# Patient Record
Sex: Male | Born: 1935
Health system: Southern US, Community
[De-identification: ages and names within clinical notes are randomized; demographics above are authoritative.]

## PROBLEM LIST (undated history)

## (undated) DIAGNOSIS — Z789 Other specified health status: Secondary | ICD-10-CM

## (undated) DIAGNOSIS — J189 Pneumonia, unspecified organism: Secondary | ICD-10-CM

## (undated) DIAGNOSIS — Z741 Need for assistance with personal care: Secondary | ICD-10-CM

## (undated) DIAGNOSIS — F0390 Unspecified dementia without behavioral disturbance: Secondary | ICD-10-CM

## (undated) DIAGNOSIS — I82409 Acute embolism and thrombosis of unspecified deep veins of unspecified lower extremity: Secondary | ICD-10-CM

## (undated) DIAGNOSIS — M1610 Unilateral primary osteoarthritis, unspecified hip: Secondary | ICD-10-CM

## (undated) DIAGNOSIS — F03A0 Unspecified dementia, mild, without behavioral disturbance, psychotic disturbance, mood disturbance, and anxiety: Secondary | ICD-10-CM

## (undated) HISTORY — PX: TONSILLECTOMY: SUR1361

## (undated) HISTORY — PX: HIP SURGERY: SHX245

## (undated) HISTORY — PX: APPENDECTOMY: SHX54

## (undated) HISTORY — DX: Unilateral primary osteoarthritis, unspecified hip: M16.10

## (undated) HISTORY — DX: Pneumonia, unspecified organism: J18.9

---

## 2001-11-14 ENCOUNTER — Other Ambulatory Visit: Admission: RE | Admit: 2001-11-14 | Discharge: 2001-11-14 | Payer: Self-pay | Admitting: Dermatology

## 2002-09-28 ENCOUNTER — Ambulatory Visit (HOSPITAL_COMMUNITY): Admission: RE | Admit: 2002-09-28 | Discharge: 2002-09-28 | Payer: Self-pay | Admitting: Family Medicine

## 2004-04-10 ENCOUNTER — Ambulatory Visit (HOSPITAL_COMMUNITY): Admission: RE | Admit: 2004-04-10 | Discharge: 2004-04-10 | Payer: Self-pay | Admitting: Family Medicine

## 2004-04-14 ENCOUNTER — Ambulatory Visit (HOSPITAL_COMMUNITY): Admission: RE | Admit: 2004-04-14 | Discharge: 2004-04-14 | Payer: Self-pay | Admitting: Family Medicine

## 2005-05-26 ENCOUNTER — Ambulatory Visit (HOSPITAL_COMMUNITY): Admission: RE | Admit: 2005-05-26 | Discharge: 2005-05-26 | Payer: Self-pay | Admitting: Family Medicine

## 2006-01-05 ENCOUNTER — Encounter (INDEPENDENT_AMBULATORY_CARE_PROVIDER_SITE_OTHER): Payer: Self-pay | Admitting: Internal Medicine

## 2006-01-05 ENCOUNTER — Ambulatory Visit (HOSPITAL_COMMUNITY): Admission: RE | Admit: 2006-01-05 | Discharge: 2006-01-05 | Payer: Self-pay | Admitting: Internal Medicine

## 2006-01-05 ENCOUNTER — Ambulatory Visit: Payer: Self-pay | Admitting: Internal Medicine

## 2008-11-02 ENCOUNTER — Encounter: Payer: Self-pay | Admitting: Cardiology

## 2009-01-31 ENCOUNTER — Encounter: Payer: Self-pay | Admitting: Cardiology

## 2009-05-10 ENCOUNTER — Ambulatory Visit (HOSPITAL_COMMUNITY): Admission: RE | Admit: 2009-05-10 | Discharge: 2009-05-10 | Payer: Self-pay | Admitting: Family Medicine

## 2009-05-14 ENCOUNTER — Inpatient Hospital Stay (HOSPITAL_COMMUNITY): Admission: EM | Admit: 2009-05-14 | Discharge: 2009-05-20 | Payer: Self-pay | Admitting: Emergency Medicine

## 2009-05-24 ENCOUNTER — Ambulatory Visit (HOSPITAL_COMMUNITY): Admission: RE | Admit: 2009-05-24 | Discharge: 2009-05-24 | Payer: Self-pay | Admitting: Family Medicine

## 2009-06-20 ENCOUNTER — Encounter: Payer: Self-pay | Admitting: Cardiology

## 2009-07-04 ENCOUNTER — Encounter: Payer: Self-pay | Admitting: Cardiology

## 2009-07-05 ENCOUNTER — Ambulatory Visit (HOSPITAL_COMMUNITY): Admission: RE | Admit: 2009-07-05 | Discharge: 2009-07-05 | Payer: Self-pay | Admitting: Family Medicine

## 2009-07-05 ENCOUNTER — Encounter: Payer: Self-pay | Admitting: Family Medicine

## 2009-07-05 ENCOUNTER — Ambulatory Visit: Payer: Self-pay | Admitting: Cardiology

## 2009-07-05 ENCOUNTER — Encounter: Payer: Self-pay | Admitting: Cardiology

## 2009-07-15 DIAGNOSIS — M1610 Unilateral primary osteoarthritis, unspecified hip: Secondary | ICD-10-CM | POA: Insufficient documentation

## 2009-07-23 ENCOUNTER — Ambulatory Visit: Payer: Self-pay

## 2009-07-23 ENCOUNTER — Encounter: Payer: Self-pay | Admitting: Cardiology

## 2009-07-23 DIAGNOSIS — I459 Conduction disorder, unspecified: Secondary | ICD-10-CM | POA: Insufficient documentation

## 2009-07-23 DIAGNOSIS — I872 Venous insufficiency (chronic) (peripheral): Secondary | ICD-10-CM | POA: Insufficient documentation

## 2009-08-14 ENCOUNTER — Inpatient Hospital Stay (HOSPITAL_COMMUNITY): Admission: RE | Admit: 2009-08-14 | Discharge: 2009-08-19 | Payer: Self-pay | Admitting: Orthopedic Surgery

## 2009-08-19 ENCOUNTER — Inpatient Hospital Stay: Admission: AD | Admit: 2009-08-19 | Discharge: 2009-08-26 | Payer: Self-pay | Admitting: Family Medicine

## 2010-05-09 ENCOUNTER — Ambulatory Visit (HOSPITAL_COMMUNITY): Admission: RE | Admit: 2010-05-09 | Discharge: 2010-05-09 | Payer: Self-pay | Admitting: Family Medicine

## 2011-01-16 ENCOUNTER — Encounter (INDEPENDENT_AMBULATORY_CARE_PROVIDER_SITE_OTHER): Payer: Self-pay

## 2011-01-21 NOTE — Letter (Signed)
Summary: Recall Colonoscopy/Endoscopy, Change to Office Visit  [facility]  [address]   [place], [place] [address]   Phone: [phone]  Fax: [phone]      January 16, 2011   [name] [address] [place], [place]  [address] January 31, 1927   Dear [name],   According to our records, it is time for you to schedule a Colonoscopy/Endoscopy. However, after reviewing your medical record, we recommend an office visit in order to determine your need for a repeat procedure.  Please call [phone] at your convenience to schedule an office visit. If you have any questions or concerns, please feel free to contact our office.   Sincerely,   Cloria Spring LPN  [facility] Ph: [phone]   Fax: [phone]

## 2011-03-06 LAB — BASIC METABOLIC PANEL
BUN: 16 mg/dL (ref 6–23)
CO2: 30 mEq/L (ref 19–32)
Chloride: 101 mEq/L (ref 96–112)
Creatinine, Ser: 0.83 mg/dL (ref 0.4–1.5)
Creatinine, Ser: 0.91 mg/dL (ref 0.4–1.5)
GFR calc Af Amer: >60 mL/min (ref >60)
GFR calc non Af Amer: >60 mL/min (ref >60)
GFR calc non Af Amer: >60 mL/min (ref >60)
Glucose, Bld: 124 mg/dL — ABNORMAL HIGH (ref 70–99)
Potassium: 4.3 mEq/L (ref 3.5–5.1)

## 2011-03-06 LAB — COMPREHENSIVE METABOLIC PANEL
AST: 27 U/L (ref 0–37)
CO2: 30 mEq/L (ref 19–32)
Calcium: 9.2 mg/dL (ref 8.4–10.5)
Creatinine, Ser: 0.89 mg/dL (ref 0.4–1.5)
GFR calc Af Amer: >60 mL/min (ref >60)
GFR calc non Af Amer: >60 mL/min (ref >60)
Glucose, Bld: 84 mg/dL (ref 70–99)
Sodium: 140 mEq/L (ref 135–145)
Total Protein: 6.6 g/dL (ref 6.0–8.3)

## 2011-03-06 LAB — CBC
HCT: 28.6 % — ABNORMAL LOW (ref 39.0–52.0)
HCT: 30.5 % — ABNORMAL LOW (ref 39.0–52.0)
Hemoglobin: 10.2 g/dL — ABNORMAL LOW (ref 13.0–17.0)
Hemoglobin: 10.3 g/dL — ABNORMAL LOW (ref 13.0–17.0)
MCHC: 33.4 g/dL (ref 30.0–36.0)
MCHC: 34.2 g/dL (ref 30.0–36.0)
MCHC: 33.3 g/dL (ref 30.0–36.0)
MCV: 86 fL (ref 78.0–100.0)
Platelets: 113 10*3/uL — ABNORMAL LOW (ref 150–400)
RBC: 3.35 MIL/uL — ABNORMAL LOW (ref 4.22–5.81)
RBC: 3.52 MIL/uL — ABNORMAL LOW (ref 4.22–5.81)
RBC: 3.56 MIL/uL — ABNORMAL LOW (ref 4.22–5.81)
RBC: 4.4 MIL/uL (ref 4.22–5.81)
RDW: 15 % (ref 11.5–15.5)
WBC: 6.7 10*3/uL (ref 4.0–10.5)
WBC: 8.3 10*3/uL (ref 4.0–10.5)
WBC: 8.5 10*3/uL (ref 4.0–10.5)

## 2011-03-06 LAB — PROTIME-INR
INR: 1.1 (ref 0.00–1.49)
INR: 1.3 (ref 0.00–1.49)
INR: 1.6 — ABNORMAL HIGH (ref 0.00–1.49)
INR: 1.9 — ABNORMAL HIGH (ref 0.00–1.49)
INR: 2.4 — ABNORMAL HIGH (ref 0.00–1.49)
Prothrombin Time: 14 seconds (ref 11.6–15.2)
Prothrombin Time: 16.4 seconds — ABNORMAL HIGH (ref 11.6–15.2)
Prothrombin Time: 21.4 seconds — ABNORMAL HIGH (ref 11.6–15.2)
Prothrombin Time: 25.6 seconds — ABNORMAL HIGH (ref 11.6–15.2)
Prothrombin Time: 12.9 seconds (ref 11.6–15.2)

## 2011-03-06 LAB — APTT: aPTT: 27 seconds (ref 24–37)

## 2011-03-06 LAB — TYPE AND SCREEN
ABO/RH(D): A NEG
Antibody Screen: NEGATIVE

## 2011-03-06 LAB — URINALYSIS, ROUTINE W REFLEX MICROSCOPIC
Bilirubin Urine: NEGATIVE
Ketones, ur: NEGATIVE mg/dL
Leukocytes, UA: NEGATIVE
Nitrite: NEGATIVE
Protein, ur: NEGATIVE mg/dL
pH: 5.5 (ref 5.0–8.0)

## 2011-03-07 LAB — DIFFERENTIAL
Basophils Absolute: 0 10*3/uL (ref 0.0–0.1)
Basophils Relative: 0 % (ref 0–1)
Eosinophils Absolute: 0.1 10*3/uL (ref 0.0–0.7)
Neutro Abs: 3.8 10*3/uL (ref 1.7–7.7)
Neutrophils Relative %: 68 % (ref 43–77)

## 2011-03-07 LAB — BASIC METABOLIC PANEL
BUN: 24 mg/dL — ABNORMAL HIGH (ref 6–23)
CO2: 32 mEq/L (ref 19–32)
Calcium: 9.7 mg/dL (ref 8.4–10.5)
Chloride: 102 mEq/L (ref 96–112)
Creatinine, Ser: 1.04 mg/dL (ref 0.4–1.5)

## 2011-03-07 LAB — HEPATIC FUNCTION PANEL
Albumin: 4 g/dL (ref 3.5–5.2)
Total Bilirubin: 0.5 mg/dL (ref 0.3–1.2)
Total Protein: 7.2 g/dL (ref 6.0–8.3)

## 2011-03-07 LAB — CBC
MCHC: 34.1 g/dL (ref 30.0–36.0)
MCV: 84.8 fL (ref 78.0–100.0)
Platelets: 160 10*3/uL (ref 150–400)
RDW: 15.9 % — ABNORMAL HIGH (ref 11.5–15.5)

## 2011-03-07 LAB — BRAIN NATRIURETIC PEPTIDE: Pro B Natriuretic peptide (BNP): 70.3 pg/mL (ref 0.0–100.0)

## 2011-03-09 LAB — CBC
HCT: 37.7 % — ABNORMAL LOW (ref 39.0–52.0)
Hemoglobin: 12.6 g/dL — ABNORMAL LOW (ref 13.0–17.0)
Hemoglobin: 12.8 g/dL — ABNORMAL LOW (ref 13.0–17.0)
MCHC: 34.4 g/dL (ref 30.0–36.0)
MCV: 83.6 fL (ref 78.0–100.0)
Platelets: 109 10*3/uL — ABNORMAL LOW (ref 150–400)
Platelets: 111 10*3/uL — ABNORMAL LOW (ref 150–400)
RBC: 4.37 MIL/uL (ref 4.22–5.81)
RBC: 4.47 MIL/uL (ref 4.22–5.81)
RDW: 14.8 % (ref 11.5–15.5)
WBC: 12.3 10*3/uL — ABNORMAL HIGH (ref 4.0–10.5)

## 2011-03-09 LAB — COMPREHENSIVE METABOLIC PANEL
AST: 23 U/L (ref 0–37)
Albumin: 3.6 g/dL (ref 3.5–5.2)
Alkaline Phosphatase: 87 U/L (ref 39–117)
CO2: 31 mEq/L (ref 19–32)
Chloride: 98 mEq/L (ref 96–112)
GFR calc Af Amer: >60 mL/min (ref >60)
GFR calc non Af Amer: >60 mL/min (ref >60)
Potassium: 3.8 mEq/L (ref 3.5–5.1)
Total Bilirubin: 1.1 mg/dL (ref 0.3–1.2)

## 2011-03-09 LAB — DIFFERENTIAL
Basophils Absolute: 0 10*3/uL (ref 0.0–0.1)
Basophils Absolute: 0 10*3/uL (ref 0.0–0.1)
Basophils Absolute: 0.1 10*3/uL (ref 0.0–0.1)
Basophils Relative: 1 % (ref 0–1)
Eosinophils Absolute: 0.2 10*3/uL (ref 0.0–0.7)
Eosinophils Absolute: 0 10*3/uL (ref 0.0–0.7)
Eosinophils Relative: 3 % (ref 0–5)
Eosinophils Relative: 0 % (ref 0–5)
Lymphocytes Relative: 5 % — ABNORMAL LOW (ref 12–46)
Lymphs Abs: 0.6 10*3/uL — ABNORMAL LOW (ref 0.7–4.0)
Monocytes Absolute: 0.9 10*3/uL (ref 0.1–1.0)
Neutro Abs: 6.5 10*3/uL (ref 1.7–7.7)
Neutro Abs: 6.8 10*3/uL (ref 1.7–7.7)

## 2011-03-09 LAB — CULTURE, BLOOD (ROUTINE X 2): Culture: NO GROWTH

## 2011-03-09 LAB — URINE CULTURE: Culture: NO GROWTH

## 2011-03-09 LAB — BASIC METABOLIC PANEL
Chloride: 101 mEq/L (ref 96–112)
Creatinine, Ser: 1.12 mg/dL (ref 0.4–1.5)
GFR calc Af Amer: >60 mL/min (ref >60)
Sodium: 138 mEq/L (ref 135–145)

## 2011-04-14 NOTE — Group Therapy Note (Signed)
NAME:  CEBERT, DETTMANN                ACCOUNT NO.:  [id number]   MEDICAL RECORD NO.:  [id number]          PATIENT TYPE:  INP   LOCATION:  A317                          FACILITY:  [facility]   PHYSICIAN:  Edward L. Juanetta Gosling, M.D.DATE OF BIRTH:  1926-12-26   DATE OF PROCEDURE:  DATE OF DISCHARGE:                                 PROGRESS NOTE   Mr. Rupe looks much better this morning.  He is awake and alert, seems  more comfortable and has no new complaints.  He says he is breathing  okay.   PHYSICAL EXAMINATION:  VITAL SIGNS:  Temp is 98, pulse 60, respirations  20, blood pressure 152/85, and O2 sat is 97% on room air.  CHEST:  Clear.  HEART:  Regular without gallop.  ABDOMEN:  Soft.   ASSESSMENT:  I think, he is better.   PLAN:  For him to have a chest x-ray, CBC in the morning.  I think, he  probably needs a PT consultation to try to get him up.      Edward L. Juanetta Gosling, M.D.  Electronically Signed     ELH/MEDQ  D:  05/19/2009  T:  05/20/2009  Job:  [id number]

## 2011-04-14 NOTE — H&P (Signed)
NAME:  [name], [name]                ACCOUNT NO.:  [id number]   MEDICAL RECORD NO.:  [id number]          PATIENT TYPE:  INP   LOCATION:  A317                          FACILITY:  [facility]   PHYSICIAN:  Edward L. Juanetta Gosling, M.D.DATE OF BIRTH:  Jul 05, 1927   DATE OF ADMISSION:  05/14/2009  DATE OF DISCHARGE:  LH                              HISTORY & PHYSICAL   PRIMARY CARE PHYSICIAN:  Angus G. McInnis, MD   REASON FOR ADMISSION:  Weakness, change in mental status, abnormal chest  x-ray.   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  According to family, he has been a bit  confused, complaining of left hip pain and he has had some diarrhea,  poor appetite and eventually was brought to the emergency room because  of all this.   PAST MEDICAL HISTORY:  Positive for arthritis.   MEDICATIONS:  None chronically.   SOCIAL HISTORY:  He does not smoke, does not use any alcohol. Does not  use any illicit drugs.   FAMILY HISTORY:  Not positive for any serious medical problems as far as  is know.   REVIEW OF SYSTEMS:  Except as mentioned, he has not lost any weight.  He  has complained of arthritis in his hip.  He had not had a cough.   PHYSICAL EXAMINATION:  Blood pressure 130/52, pulse 76, respirations 18  and temperature 100.5.  GENERAL:  He is a bit confused and has a little more word searching  inability to express himself than I have seen in the past.  HEENT:  His pupils are reactive, nose and throat are clear.  NECK:  Supple without masses.  HEART:  Regular without gallop.  ABDOMEN:  Soft.  CHEST:  Clear.   LABORATORY DATA:  White count 12,300, hemoglobin 13.  Platelets 111.  Cardiac panel is negative.  He had a chest x-ray that shows what may be  a mass in the right lower lobe area.   ASSESSMENT:  He has had some confusion, some change in mental status.  I  have  asked him to go ahead and have a CT of the brain, and I have  ordered CT of the chest to determine what the mass in his chest is.  Will start him on antibiotics.  Further evaluation per Dr. Renard Matter in  the morning.      Edward L. Juanetta Gosling, M.D.  Electronically Signed     ELH/MEDQ  D:  05/15/2009  T:  05/15/2009  Job:  [id number]

## 2011-04-14 NOTE — Group Therapy Note (Signed)
NAME:  [name], [name]                ACCOUNT NO.:  [id number]   MEDICAL RECORD NO.:  [id number]          PATIENT TYPE:  INP   LOCATION:  A317                          FACILITY:  [facility]   PHYSICIAN:  Edward L. Juanetta Gosling, M.D.DATE OF BIRTH:  [birth year]   DATE OF PROCEDURE:  DATE OF DISCHARGE:                                 PROGRESS NOTE   [name] had a CT of the head and chest yesterday and the area on his  chest x-ray looks more like a pneumonia than it does a lung cancer which  of course is excellent news.  His CT of the brain was okay with no  lesions, but some age-related atrophy, hardening of the arteries, etc.  Everything else seems to be doing okay.  He is resting quietly this  morning.  I did not awaken him.  His vital signs are as recorded.  His  chest is clear some.   Plan then would be to continue with his medications and treatments and  try to get him on continued IV antibiotics, etc.      Edward L. Juanetta Gosling, M.D.  Electronically Signed     ELH/MEDQ  D:  05/16/2009  T:  05/16/2009  Job:  [id number]

## 2011-04-14 NOTE — Group Therapy Note (Signed)
NAME:  DEOVION, BATREZ                ACCOUNT NO.:  [id number]   MEDICAL RECORD NO.:  [id number]          PATIENT TYPE:  INP   LOCATION:  A317                          FACILITY:  [facility]   PHYSICIAN:  [name], MD   DATE OF BIRTH:  1927/11/25   DATE OF PROCEDURE:  05/18/2009  DATE OF DISCHARGE:                                 PROGRESS NOTE   SUBJECTIVE:  This patient continues to feel much better.  He is  afebrile.   OBJECTIVE:  VITAL SIGNS:  Blood pressure 132/73, respirations 18, pulse  75.  Repeat CBC WBC 7900 with a hemoglobin 12.8, hematocrit 36.8.  LUNGS:  Rhonchi over lower lung field.  HEART:  Regular rhythm.  ABDOMEN:  No palpable organs or masses.   ASSESSMENT:  The patient is being treated for bronchopneumonia right  lower lobe.  Repeat chest x-ray shows a worsening of the right lower  lobe, airspace disease, and small bilateral pleural effusions.   PLAN:  To continue current antibiotic regimen.  I have discussed this  with Dr. Juanetta Gosling with an agreement that current regimen should be  continued until first of the week and may consider changing antibiotic  regimen.      [name], MD  Electronically Signed     AGM/MEDQ  D:  05/18/2009  T:  05/18/2009  Job:  [id number]

## 2011-04-14 NOTE — Group Therapy Note (Signed)
NAME:  [name], [name]                ACCOUNT NO.:  [id number]   MEDICAL RECORD NO.:  [id number]          PATIENT TYPE:  INP   LOCATION:  A317                          FACILITY:  [facility]   PHYSICIAN:  [name], MD   DATE OF BIRTH:  [birth year]   DATE OF PROCEDURE:  DATE OF DISCHARGE:                                 PROGRESS NOTE   SUBJECTIVE:  This patient admitted to hospital with chief problem being  nausea, vomiting, thought to have gastroenteritis.  The patient has had  significant weight loss over the past few months, approximately 60  pounds, has had difficulty ambulating, was seen in the office prior to  this visit with left hip and leg pain.  X-rays were obtained on left  hip, which showed advanced arthropathic change in the left hip  suggestive of CPPD and atypical osteoarthritis.  The patient was noted  to have a right middle lobe lung mass suggestive of bronchogenic  carcinoma.  No acute findings in the abdomen.  Lumbar spine shows  multilevel spondylosis and arthritis.   OBJECTIVE:  VITAL SIGNS:  Blood pressure 132/59, respirations 18, pulse  61, and temperature 100.5.  LUNGS:  Clear to P and A.  HEART:  Regular rhythm.  ABDOMEN:  No palpable organs or masses.  The patient has decreased range  of motion of left hip.   LABORATORY DATA:  WBC 12,300.  Chemistries otherwise within normal  limits.  Urine culture is pending.   ASSESSMENT:  The patient was admitted with vomiting, weight loss, does  have what appears to be bronchogenic carcinoma, has severe arthritic  changes in the left hip.   PLAN:  Continue IV fluids.  Continue to monitor electrolytes.  We will  obtain Pulmonary consult with respect to the lung lesion.      [name], MD  Electronically Signed     AGM/MEDQ  D:  05/15/2009  T:  05/15/2009  Job:  [id number]

## 2011-04-14 NOTE — Group Therapy Note (Signed)
NAME:  [name], [name]                ACCOUNT NO.:  [id number]   MEDICAL RECORD NO.:  [id number]          PATIENT TYPE:  INP   LOCATION:  A317                          FACILITY:  [facility]   PHYSICIAN:  [name] G. Renard Matter, MD   DATE OF BIRTH:  1927-09-13   DATE OF PROCEDURE:  05/19/2009  DATE OF DISCHARGE:                                 PROGRESS NOTE   SUBJECTIVE:  This patient continues to improve symptomatically.  He has  been treated for bronchopneumonia involving in the right lower lobe.  Repeat chest x-ray showed worsening of the right lower lobe airspace  disease and small bilateral pleural effusions.   OBJECTIVE:  VITAL SIGNS:  Blood pressure 150/59, respirations 18, pulse  65, and temperature 98.1.  LUNGS:  Decreased breath sounds bilaterally.  HEART:  Regular rhythm.  No murmurs.  ABDOMEN:  No palpable organs or masses.  EXTREMITIES:  The patient does have edema in the left lower leg and  foot.   ASSESSMENT:  The patient is being treated for bronchopneumonia, right  lower lobe, is consideration of underlying neoplasm.   PLAN:  Continue current antibiotic regimen.  We will repeat chest x-ray  tomorrow.  Continue current regimen otherwise.      [name] G. Renard Matter, MD  Electronically Signed     AGM/MEDQ  D:  05/19/2009  T:  05/19/2009  Job:  [id number]

## 2011-04-14 NOTE — Group Therapy Note (Signed)
NAME:  [name], [name]                ACCOUNT NO.:  [id number]   MEDICAL RECORD NO.:  [id number]         PATIENT TYPE:  PINP   LOCATION:  A317                          FACILITY:  [facility]   PHYSICIAN:  Edward L. Juanetta Gosling, M.D.DATE OF BIRTH:  1927-04-21   DATE OF PROCEDURE:  DATE OF DISCHARGE:                                 PROGRESS NOTE   A patient of Dr. Renard Matter.  [name] looks remarkably better this  morning.  He is less sluggish.  He is more alert.  He has been up and  walking.   His exam shows his temperature is 97.4, pulse 75, respirations 18, blood  pressure 132/73, O2 sat is 94% on room air.  His chest is clearer, and  he overall looks better.   His chest x-ray yesterday showed more infiltrate but I think it is just  that we are catching this early.   PLAN:  Continue with his meds and treatments.  He is going to have  another chest x-ray on Monday, May 20, 2009.      Edward L. Juanetta Gosling, M.D.  Electronically Signed     ELH/MEDQ  D:  05/18/2009  T:  05/19/2009  Job:  [id number]

## 2011-04-14 NOTE — Group Therapy Note (Signed)
NAME:  JUANDIEGO, KOLENOVIC                ACCOUNT NO.:  [id number]   MEDICAL RECORD NO.:  [id number]         PATIENT TYPE:  PINP   LOCATION:  A317                          FACILITY:  [facility]   PHYSICIAN:  Edward L. Juanetta Gosling, M.D.DATE OF BIRTH:  1927-02-04   DATE OF PROCEDURE:  DATE OF DISCHARGE:                                 PROGRESS NOTE   Mr. Pettry has been admitted with pneumonia and says he is feeling a  little better, but he is nauseated.   His physical examination today shows temperature is 98.6, pulse 65,  respirations 20, blood pressure 146/80, and O2 sat is 95% on room air.  His heart is regular.  His abdomen is soft.   ASSESSMENT:  He is better.  He has a chest x-ray pending this morning.  His white blood count is about 8000, so I think he is doing somewhat  better, but he may be having some nausea probably from Zithromax.   PLAN:  To continue treatments and follow.      Edward L. Juanetta Gosling, M.D.  Electronically Signed     ELH/MEDQ  D:  05/17/2009  T:  05/18/2009  Job:  [id number]

## 2011-04-14 NOTE — Consult Note (Signed)
NAME:  [name], [name]                ACCOUNT NO.:  [id number]   MEDICAL RECORD NO.:  [id number]          PATIENT TYPE:  INP   LOCATION:  A317                          FACILITY:  [facility]   PHYSICIAN:  Edward L. Juanetta Gosling, M.D.DATE OF BIRTH:  Nov 18, 1927   DATE OF CONSULTATION:  DATE OF DISCHARGE:                                 CONSULTATION   [name] is a patient of Dr. Renard Matter.  He is a retired Programmer, multimedia.  He  was in his usual state of fair health.  He had severe hip pain that  started a couple of weeks ago, went to see Dr. Renard Matter and he was noted  at that time, which he did not let us know about it when he had his  admission to the hospital, that he lost 30-40 pounds.  He attributes  that to poor eating habits, etc.  He has had some confusion, pain in the  left hip, he had some diarrhea.   PAST MEDICAL HISTORY:  Otherwise is essentially just positive for  arthritis and he had been on Lipitor, which he blamed is hip pain on.   MEDICATIONS:  He does not take any medications at all now.   SOCIAL HISTORY:  He does not smoke or use alcohol.  He does not use any  illicit drugs.  He is retired and works Armed forces operational officer.   FAMILY HISTORY:  Not positive for any serious medical problems as far as  he knows.   REVIEW OF SYSTEMS:  As mentioned on his initial history and physical in  the emergency room, he told me he had not lost any weight, but I  discussed this with Dr. Renard Matter and he says he has lost a great deal of  weight.  I suspect that is because [name] was confused.   PHYSICAL EXAMINATION:  GENERAL:  He is awake.  He is alert.  He is  possibly a bit confused.  VITAL SIGNS:  Temperature 100.5, pulse 61, respirations 18, blood  pressure 132/59, O2 sats 95%, his height 75 inches and weight 78.4 kg.  HEENT:  His pupils are reactive.  Mucous membranes are moist.  NECK:  Supple.  CHEST:  Clear.  HEART:  Regular without gallop.  ABDOMEN:  Soft.  EXTREMITIES:  No edema.   His chest x-ray  shows a mass lesion in the right lung.  CT of the chest  and brain is pending.  Further workup depending on results of that.       Edward L. Juanetta Gosling, M.D.  Electronically Signed     ELH/MEDQ  D:  05/15/2009  T:  05/15/2009  Job:  [id number]

## 2011-04-14 NOTE — Group Therapy Note (Signed)
NAME:  [name], [name]                ACCOUNT NO.:  [id number]   MEDICAL RECORD NO.:  [id number]          PATIENT TYPE:  INP   LOCATION:  A317                          FACILITY:  [facility]   PHYSICIAN:  [name], MD   DATE OF BIRTH:  03-19-1927   DATE OF PROCEDURE:  DATE OF DISCHARGE:  05/20/2009                                 PROGRESS NOTE   This patient continues to improve symptomatically.  He has a little  problem with constipation and complains of frequency of urination.  He  has been treated for bronchopneumonia involving the right lower lobe.  A  repeat chest x-ray did show worsening of the right lower lobe airspace  disease with small bilateral pleural effusions.   OBJECTIVE:  VITAL SIGNS:  Blood pressure 141/67, respirations 18, pulse  57, temperature 98.5, O2 sats ranged between 95 and 97%.  LUNGS:  Diminished breath sounds.  HEART:  Regular rhythm.  ABDOMEN:  No palpable organs or masses.  EXTREMITIES:  The patient does have some slight swelling in the dorsum  of left foot.  He does have pigmentation of distal extremities  bilaterally.   LABORATORY FINDINGS:  Repeat CBC; WBC 8400, hemoglobin of 12.6,  hematocrit 36.5.   ASSESSMENT:  The patient continues to improve.   PLAN:  To repeat chest x-ray today.  Continue to ambulate.      [name], MD  Electronically Signed     AGM/MEDQ  D:  05/20/2009  T:  05/20/2009  Job:  [id number]

## 2011-04-14 NOTE — Group Therapy Note (Signed)
NAME:  [name], [name]                ACCOUNT NO.:  [id number]   MEDICAL RECORD NO.:  [id number]          PATIENT TYPE:  INP   LOCATION:  A317                          FACILITY:  [facility]   PHYSICIAN:  [name], MD   DATE OF BIRTH:  [birth year]   DATE OF PROCEDURE:  05/17/2009  DATE OF DISCHARGE:                                 PROGRESS NOTE   SUBJECTIVE:  This patient is tolerating food in a much better way and  feels better.  A CT of his chest showed progression of right lower lobe  air space consolidation consistent with pneumonia.   OBJECTIVE:  VITAL SIGNS:  Blood pressure 146/80, respirations 20, pulse  65, temperature 98.6.  LUNGS:  Clear to P and A.  HEART:  Irregular rhythm.  ABDOMEN:  No palpable organs or masses.  EXTREMITIES:  The patient has decreased range of motion of the left hip.   ASSESSMENT:  The patient was admitted with nausea, vomiting, felt to  have gastroenteritis, has had a history of significant weight loss over  the past few months, difficulty ambulating.  He is recovering from what  appears to be a pneumonia.  Followup x-rays are needed to exclude  malignancy.   PLAN:  To continue current regimen.  We will obtain chest x-ray today.      [name], MD  Electronically Signed     AGM/MEDQ  D:  05/17/2009  T:  05/17/2009  Job:  [id number]

## 2011-04-14 NOTE — Group Therapy Note (Signed)
NAME:  [name], [name]                ACCOUNT NO.:  [id number]   MEDICAL RECORD NO.:  [id number]          PATIENT TYPE:  INP   LOCATION:  A317                          FACILITY:  [facility]   PHYSICIAN:  Angus G. McInnis, MD   DATE OF BIRTH:  01-13-1927   DATE OF PROCEDURE:  DATE OF DISCHARGE:                                 PROGRESS NOTE   SUBJECTIVE:  This patient was admitted to hospital with nausea,  vomiting, thought to have gastroenteritis, who has had significant  weight loss over the past few months, has had difficulty ambulating.  X-  rays of his hip showed advanced osteoarthritic changes suggestive of  CPPD atypical osteoarthritis.  The patient noted to have right middle  lobe lung mass, suggestive of bronchogenic carcinoma.  CT of the head  showed cortical atrophy and nonspecific white matter changes.  CT of the  chest shows progression of the right left lower lobe air space  consolidation, findings consistent with pneumonia.  Followup to clear  and was recommend exclude malignancy.   OBJECTIVE:  VITAL SIGNS:  Blood pressure 122/59, respirations 20, pulse  64, temperature 100, and O2 sat 90-95%.  LUNGS:  Clear to P and A.  HEART:  Regular rhythm.  ABDOMEN:  No palpable organs or masses.  EXTREMITIES:  The patient has decreased range of motion of the left hip.   CBC, WBC 12,300 with hemoglobin 13.0 and hematocrit 37.7.  Chemistries  remain normal.   ASSESSMENT:  The patient was admitted with above-stated problems,  remains on intravenous fluids, IV antibiotics.  We will continue to  follow with the chest problem until clear.      [name], MD  Electronically Signed     AGM/MEDQ  D:  05/16/2009  T:  05/16/2009  Job:  [id number]

## 2011-04-14 NOTE — Group Therapy Note (Signed)
NAME:  [name], [name]                ACCOUNT NO.:  [id number]   MEDICAL RECORD NO.:  [id number]          PATIENT TYPE:  INP   LOCATION:  A317                          FACILITY:  [facility]   PHYSICIAN:  Edward L. Juanetta Gosling, M.D.DATE OF BIRTH:  06-27-1927   DATE OF PROCEDURE:  DATE OF DISCHARGE:                                 PROGRESS NOTE   [name] looks much better in general.  He states that he is feeling  better, a little more awake, and alert, able to get up and move.  He  still has significant hip pain.  He is not coughing much.   His physical examination shows his white blood count is 8400, hemoglobin  is 12.6, platelets 153.  He has 77% neutrophils.   My assessment then is that he is better.  He is to have a chest x-ray  today.  I do not expect that his chest x-ray will be clear.  I think we  should continue to follow his x-ray findings and continue with his  antibiotics.  I have told him, I think it will take about 6 weeks to  clear his pneumonia by chest x-ray.      Edward L. Juanetta Gosling, M.D.  Electronically Signed     ELH/MEDQ  D:  05/20/2009  T:  05/20/2009  Job:  [id number]

## 2011-04-17 NOTE — Procedures (Signed)
   NAME:  BENSON, PORCARO                          ACCOUNT NO.:  [id number]   MEDICAL RECORD NO.:  [id number]                   PATIENT TYPE:  OUT   LOCATION:  DFTL                                 FACILITY:  [facility]   PHYSICIAN:  Edward L. Juanetta Gosling, M.D.             DATE OF BIRTH:  August 04, 1927   DATE OF PROCEDURE:  DATE OF DISCHARGE:                                    STRESS TEST   INDICATIONS FOR PROCEDURE:  This patient came for a stress test because of  episodes of chest discomfort which were discovered at his physical  examination.  He has a history of bronchitis, but no history of any other  risk factors for coronary disease.   DESCRIPTION OF PROCEDURE:  His resting electrocardiogram shows an incomplete  right bundle branch block.  There are no contraindications for graded  exercise testing.   This patient exercised for 4 minutes on the Bruce protocol reaching and  sustaining 7.0 METS.  His maximum recorded heart rate was 142 which is 97%  of his age-predicted maximal heart rate.  He stopped exercise because of  fatigue.  He had no electrocardiographic changes suggestive of inducible  ischemia. He had no other symptoms during exercise.  Blood pressure response  to exercise was slightly exaggerated.   IMPRESSION:  1. No evidence of inducible ischemia.  2. Fair exercise tolerance.  3. Slightly exaggerated blood pressure response to exercise.  4. No symptoms during exercise.                                               Edward L. Juanetta Gosling, M.D.    Gwenlyn Found  D:  09/28/2002  T:  09/28/2002  Job:  [id number]   cc:   [name], M.D.

## 2011-04-17 NOTE — Discharge Summary (Signed)
NAME:  [name], [name]                ACCOUNT NO.:  [id number]   MEDICAL RECORD NO.:  [id number]          PATIENT TYPE:  INP   LOCATION:  A317                          FACILITY:  [facility]   PHYSICIAN:  [name], MD   DATE OF BIRTH:  Apr 24, 1927   DATE OF ADMISSION:  05/14/2009  DATE OF DISCHARGE:  06/21/2010LH                               DISCHARGE SUMMARY   DIAGNOSES:  1. Bronchopneumonia, right lower lobe.  2. Prior significant weight loss.  3. Left-hip hip pain secondary to arthritis in the hip joint.   The patient's condition stable and improved at time of discharge.   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  Became a little bit confused and  brought into the emergency room and evaluated.  He was noted to have  evidence of bronchopneumonia, right lower lobe.   EXAMINATION:  Blood pressure 130/52, pulse 76, respiration 18,  temperature 100.5.  GENERAL:  The patient is a bit confused.  HEENT:  Eyes:  PERRLA.  TMs negative.  Oropharynx benign.  NECK:  Supple.  No JVD or thyroid abnormalities.  HEART:  Regular rhythm, no murmurs.  LUNGS:  Clear to P and A.  ABDOMEN:  No palpable organs or masses.  The patient has decreased range of motion of the left hip joint.   LABORATORY DATA:  CBC on admission:  WBC 12,300 with a hemoglobin of 13,  hematocrit 37.7.  Subsequent CBC on May 20, 2009:  WBC 8400 with  hemoglobin 12.6, hematocrit 36.5.  Differential on admission 91%  neutrophils, 11.2% absolute neutrophils.  Chemistries on admission:  Sodium 135, potassium 3.8, chloride 98, CO2 31, glucose 122, BUN 16,  creatinine 1.02.  Subsequent chemistries on May 15, 2009:  Sodium 138,  potassium 3.9, chloride 109, CO2 28, glucose 112, BUN 15, creatinine  1.12, GFR greater than 60.  Hepatic profile:  Total protein 6.3, albumin  3.6, SGOT 23, SGPT 23,  alkaline phosphatase 87, bilirubin 1.1.  Lipase  24.  Urine culture:  No growth.  Myoglobin 122, CPK-MB less than 1.0,  troponin less than 0.05.   X-RAYS:  CT of the head on admission negative for acute bleed or  intracranial process, atrophy and nonspecific white matter changes.  CT  of the chest with contrast:  Progression of right lower lobe airspace  consolidation, findings consistent with pneumonia.  Followup to clearing  is recommended to exclude malignancy.  Subsequent chest x-ray:  Worsening of the right lower lobe airspace disease.  Subsequent chest x-  ray on May 20, 2009:  Improvement in right lower lobe pneumonia, COPD.   Electrocardiogram:  Right bundle-branch block, first-degree AV block.   HOSPITAL COURSE:  The patient at the time of his admission was placed on  intravenous fluids, normal saline at 150 cc an hour.  He was given  Tylenol 650 q.4 h. p.r.n. for fever, placed on regular diet.  He  subsequently was placed on IV Rocephin  1 gram daily and Zithromax 500 mg  daily.  CT of the brain was done, which showed no evidence of bleed or  metastatic disease, mild atrophy.  The patient slowly and progressively  improved throughout his hospital stay.  He was able to be placed on a  soft diet.  He did have episodes of nausea and vomiting which required  Zofran 4 mg p.r.n. for nausea.  The patient did have a right lower lobe  pneumonia.  He remained on IV antibiotics throughout his hospital stay.  PT consult was requested for ambulation towards the latter part of his  hospital stay.  The patient did have episodes of constipation requiring  milk of magnesia.  He was able to be discharged home on the 6th hospital  day.   He was discharged on following medications.  1. Levaquin 750 mg daily.  2. Multivitamin one daily.  3. Fish old 1000 mg daily.  4. Vitamin B daily.      [name], MD  Electronically Signed     AGM/MEDQ  D:  06/04/2009  T:  06/04/2009  Job:   [id number]

## 2011-04-17 NOTE — Op Note (Signed)
NAME:  [name], [name]                ACCOUNT NO.:  [id number]   MEDICAL RECORD NO.:  [id number]          PATIENT TYPE:  AMB   LOCATION:  DAY                           FACILITY:  [facility]   PHYSICIAN:  Lionel December, M.D.    DATE OF BIRTH:  1927/01/16   DATE OF PROCEDURE:  01/05/2006  DATE OF DISCHARGE:                                 OPERATIVE REPORT   PROCEDURE:  Colonoscopy.   INDICATIONS:  [name] is a 77 year old Caucasian male who is undergoing  screening colonoscopy. He states he did experience two episodes of rectal  bleeding with his bowel movement about three weeks ago. His brother recently  had a localized carcinoma removed from his rectum and is doing well. Family  history is otherwise unremarkable. Procedure and risks were reviewed with  the patient, and informed consent was obtained.   MEDICINE FOR CONSCIOUS SEDATION:  Demerol 25 mg IV, Versed 3 mg IV.   FINDINGS:  Procedure performed in endoscopy suite. The patient's vital signs  and O2 saturation were monitored during the procedure and remained stable.  The patient was placed in left lateral position. Rectal examination  performed. No abnormality noted on external or digital exam. Olympus  videoscope was placed in rectum and advanced under vision into sigmoid colon  and beyond. Preparation was excellent. Scattered diverticula were noted  throughout the colon but most of these were at sigmoid colon. Scope was  passed into cecum which was identified by ileocecal valve and appendiceal  stump. Pictures taken for the record. There was a small polyp just above and  across from the ileocecal valve which was ablated via cold biopsy. As the  scope was withdrawn, rest of the colonic mucosa was once again carefully  examined, and there no other mucosal abnormalities. Rectal mucosa was  normal. Scope was retroflexed to examine anorectal junction, and small  hemorrhoids were noted below the dentate line. Endoscope was straightened  and  withdrawn. The patient tolerated the procedure well.   FINAL DIAGNOSES:  1.  Small polyp ablated via cold biopsy from the ascending colon.  2.  Pan colonic diverticulosis. Most of the diverticula, however, are in the      sigmoid colon.  3.  Small external hemorrhoids.   RECOMMENDATIONS:  1.  High-fiber diet.  2.  Citrucel or equivalent 4 g q.d.  3.  I will be contacting the patient with biopsy results and further      recommendations.      Lionel December, M.D.  Electronically Signed     NR/MEDQ  D:  01/05/2006  T:  01/05/2006  Job:  [id number]   cc:   Lorin Picket A. Gerda Diss, MD  Fax: [phone]

## 2011-05-30 IMAGING — CR DG CHEST 2V
2 series · 2 of 2 positions shown · non-contrast
Comparison: 05/04/2009

CLINICAL DATA: Pedal edema, chest pain

CHEST - 2 VIEW

[view not recorded (1 of 2)]
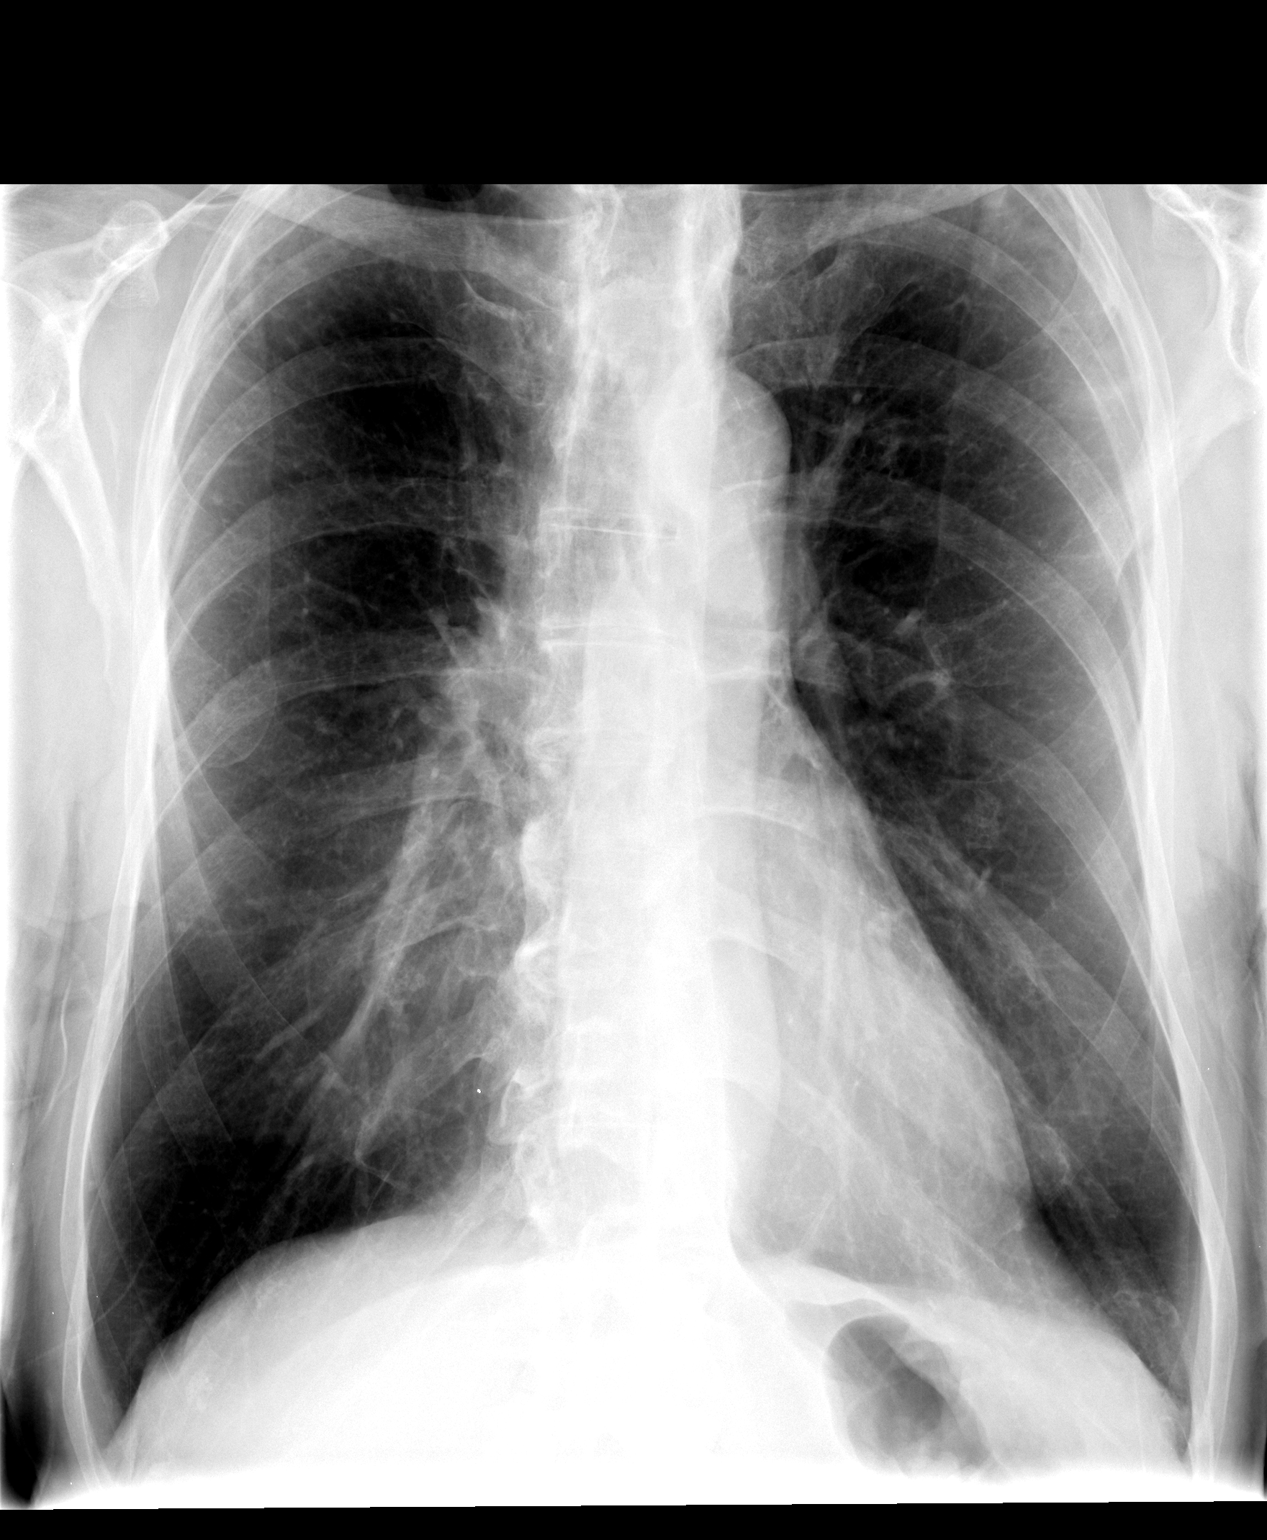

[view not recorded (2 of 2)]
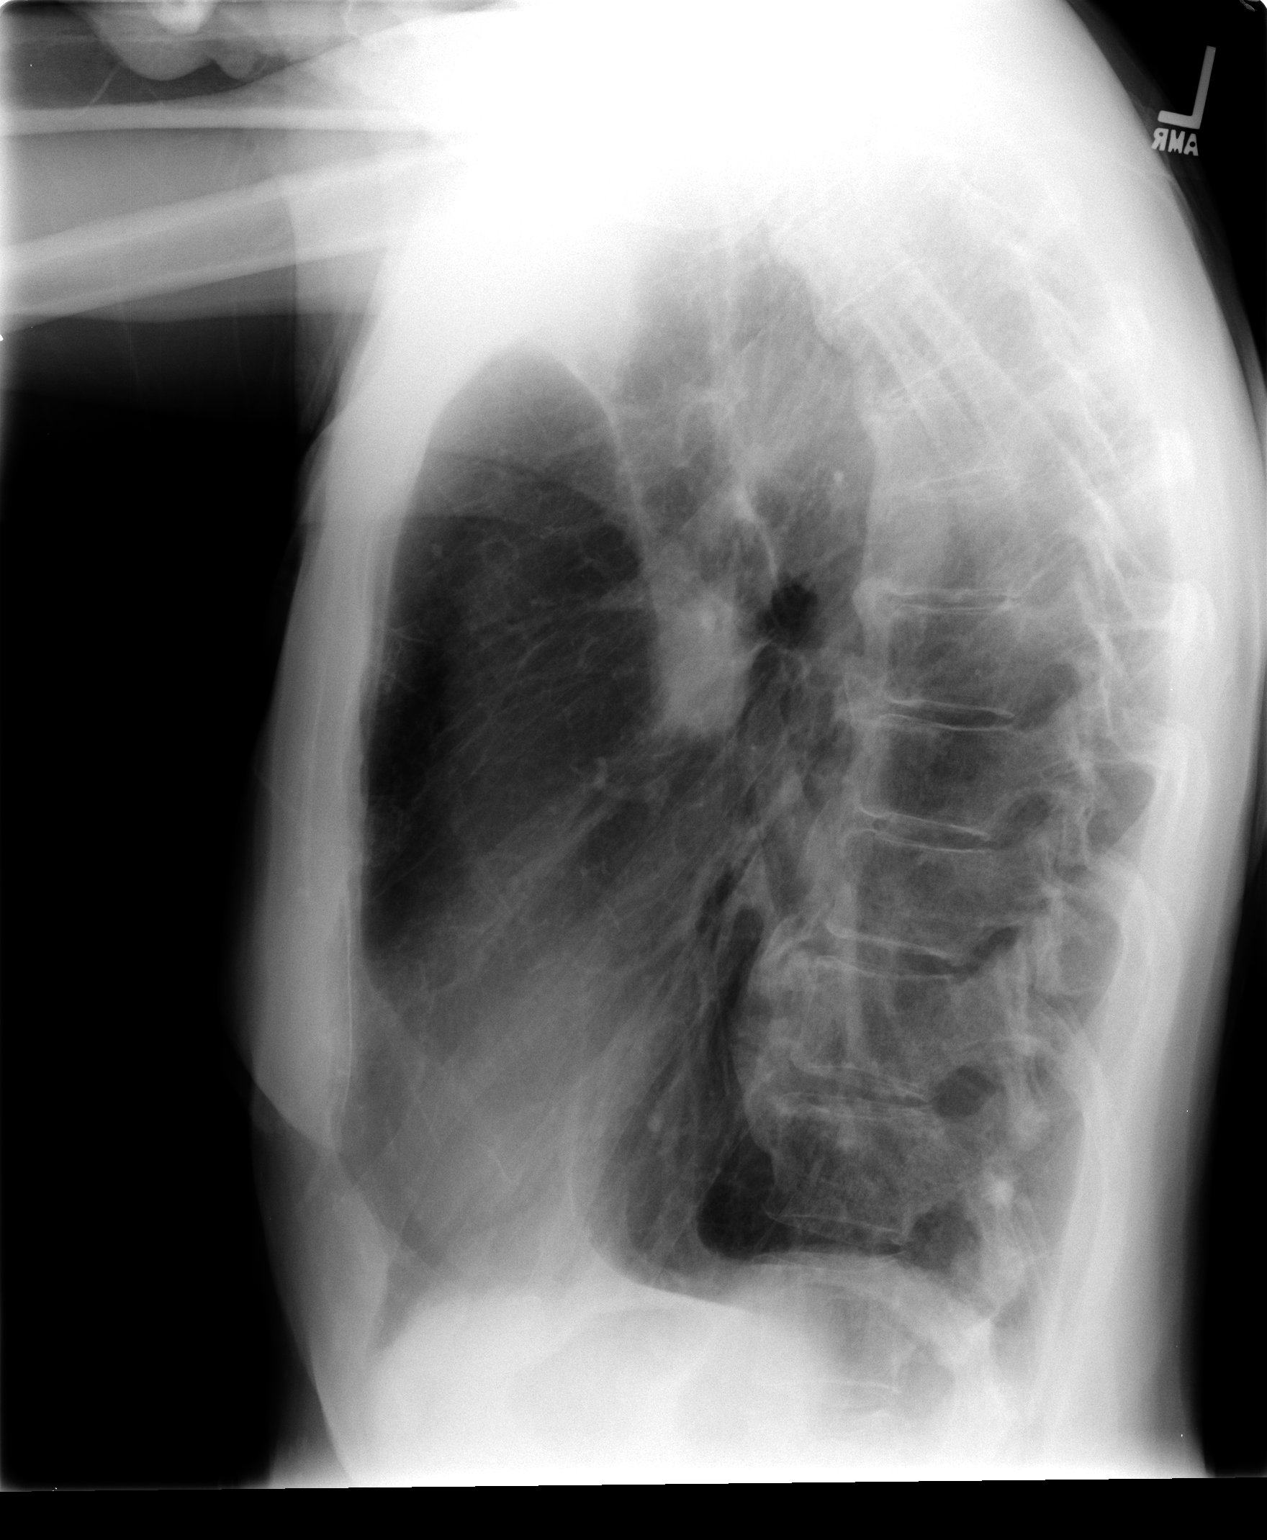

[2 of 2 positions shown; findings below may reference images not displayed]

FINDINGS: Normal heart size, mediastinal contours, and pulmonary vascularity.
Emphysematous changes compatible with COPD.
No pulmonary infiltrate, pleural effusion, or pneumothorax.
Spur formation at vertebral endplates thoracic spine.
Slight bony demineralization.
IMPRESSION: COPD.
No acute abnormalities.

## 2011-06-05 ENCOUNTER — Encounter (HOSPITAL_BASED_OUTPATIENT_CLINIC_OR_DEPARTMENT_OTHER): Payer: Medicare Other | Admitting: Internal Medicine

## 2011-06-05 ENCOUNTER — Ambulatory Visit (HOSPITAL_COMMUNITY)
Admission: RE | Admit: 2011-06-05 | Discharge: 2011-06-05 | Disposition: A | Discharge status: Home / Self Care | Payer: Medicare Other | Source: Ambulatory Visit | Attending: Internal Medicine | Admitting: Internal Medicine

## 2011-06-05 ENCOUNTER — Ambulatory Visit (HOSPITAL_COMMUNITY): Payer: Medicare Other | Admitting: Internal Medicine

## 2011-06-05 ENCOUNTER — Other Ambulatory Visit (INDEPENDENT_AMBULATORY_CARE_PROVIDER_SITE_OTHER): Payer: Self-pay | Admitting: Internal Medicine

## 2011-06-05 DIAGNOSIS — D126 Benign neoplasm of colon, unspecified: Secondary | ICD-10-CM | POA: Insufficient documentation

## 2011-06-05 DIAGNOSIS — K644 Residual hemorrhoidal skin tags: Secondary | ICD-10-CM | POA: Insufficient documentation

## 2011-06-05 DIAGNOSIS — Z860100 Personal history of colon polyps, unspecified: Secondary | ICD-10-CM | POA: Insufficient documentation

## 2011-06-05 DIAGNOSIS — K5730 Diverticulosis of large intestine without perforation or abscess without bleeding: Secondary | ICD-10-CM

## 2011-06-05 DIAGNOSIS — K625 Hemorrhage of anus and rectum: Secondary | ICD-10-CM

## 2011-06-22 NOTE — Op Note (Signed)
  NAME:  [name], [name] NO.:  [id number]  MEDICAL RECORD NO.:  [id number]  LOCATION:  DAYP                          FACILITY:  [facility]  PHYSICIAN:  Lionel December, M.D.    DATE OF BIRTH:  1927-11-25  DATE OF PROCEDURE:  06/05/2011 DATE OF DISCHARGE:                              OPERATIVE REPORT   PROCEDURE:  Colonoscopy with snare polypectomy.  INDICATION:  [name] is an 77 year old Caucasian male who suffered an episode of acute rectal bleeding last week and he was seen by Dr. Renard Matter in the office and noted to have bright red blood in his rectum. His hemoglobin was 12-1/2.  He was managed at home.  He has not had any more problems.  He denies abdominal pain, weight loss, diarrhea and/or constipation.  The patient's last colonoscopy was in February 2007 with removal of small polyp and he also had pancolonic diverticulosis. Procedure risks were reviewed with the patient and informed consent was obtained.  MEDICATIONS FOR CONSCIOUS SEDATION: 1. Demerol 30 mg IV. 2. Versed 3 mg IV.  FINDINGS:  Procedure performed in endoscopy suite.  The patient's vital signs and O2 sats were monitored during the procedure and remained stable.  The patient was placed in left lateral recumbent position and rectal examination performed.  No abnormality noted on external or digital exam.  Pentax videoscope was placed through rectum and advanced under vision into sigmoid colon.  He had big chunk of stool in this area and multiple diverticula.  Scope was carefully passed this area.  Once past the splenic flexure, preparation was much better.  He had diverticula scattered throughout the colon.  Scope was passed through cecum which was identified by appendiceal orifice and ileocecal valve. There was 7-mm polyp above and across ileocecal valve which was snared and retrieved for a sludge examination.  During this process, I broke into pieces.  Mucosa and rest of the colon was normal.  Rectal  mucosa similarly was normal.  Scope was retroflexed to examine anorectal junction and small hemorrhoids noted below the dentate line.  Endoscope was withdrawn.  Withdrawal time was 12 minutes.  The patient tolerated the procedure well.  FINAL DIAGNOSES: 1. Examination performed to cecum. 2. Pancolonic diverticulosis with multiple diverticula at sigmoid     colon. 3. A 7-mm polyp snared from the ascending colon. 4. Small external hemorrhoids. 5. I suspect his recent episode of rectal bleeding is secondary to     colonic diverticulosis.  RECOMMENDATIONS: 1. No aspirin for 10 days. 2. High-fiber diet plus fiber supplement 4 g daily. 3. Colace 2 tablets at bedtime. 4. I will be contacting the patient with results of biopsy and further     recommendations.          ______________________________ Lionel December, M.D.     NR/MEDQ  D:  06/05/2011  T:  06/05/2011  Job:  [id number]  cc:   [name], MD Fax: [phone]  Electronically Signed by Lionel December M.D. on 06/22/2011 12:12:23 AM

## 2011-06-29 NOTE — H&P (Signed)
NAME:  [name], [name] NO.:  [id number]  MEDICAL RECORD NO.:  [id number]  LOCATION:  DAYP                          FACILITY:  [facility]  PHYSICIAN:  Lionel December, M.D.    DATE OF BIRTH:  1926/12/18  DATE OF ADMISSION:  06/05/2011 DATE OF DISCHARGE:  07/06/2012LH                             HISTORY & PHYSICAL   PRESENTING COMPLAINT:  Rectal bleeding.  The patient is here for colonoscopy.  HISTORY OF PRESENT ILLNESS:  [name] is an 77 year old Caucasian male who saw Dr. Renard Matter, last week with acute onset of rectal bleeding he passed moderate-to-large or bright red blood per rectum stopped  after few bowel movements.  He did not experience any abdominal pain, nausea, vomiting, or postal symptoms.  He also denies recent changes of bowel habits or weight loss.  The patient's hemoglobin was 12.5 g.  Dr. Renard Matter, felt that he was stable to be worked up on an outpatient basis.  He has not had any more bleeding episodes since that one.  The patient's last colonoscopy was in February 2007, by me revealing small polyp and he had pancolonic diverticulosis.  PAST MEDICAL HISTORY: 1. He has osteoarthrosis.  He takes _______ 1 daily. 2. He has had appendectomy and tonsillectomy several years ago and he     had left hip replacement in September 2010 for severe arthritis.  ALLERGIES:  SULFA.  FAMILY HISTORY:  Negative for colorectal carcinoma.  PHYSICAL EXAMINATION:  GENERAL:  Pleasant, well-developed thin Caucasian male who is in no acute distress. VITAL SIGNS:  He weighs 80 pounds, he is 6 feet 3 inches tall.  Pulse 72 per minute, blood pressure 141/74, respirations 18, and he is afebrile. HEENT:  Conjunctivae are pink.  Sclerae nonicteric.  Oropharyngeal mucosa is normal. NECK:  No masses or thyromegaly noted. LUNGS:  Clear to auscultation. ABDOMEN:  Flat and soft without tenderness, organomegaly or masses. EXTREMITIES:  No peripheral edema or clubbing  noted.  ASSESSMENT:  [name] is an 77 year old Caucasian male who has history of colonic diverticulosis who presents with self-limiting bout of acute rectal bleeding which appears to be most likely from colonic diverticulosis.  His last exam was over 5 years ago and has a single polyp removed.  He is undergoing diagnostic colonoscopy.  RECOMMENDATIONS:  Colonoscopy.  Procedure risks were reviewed with the patient and he is agreeable.                                           ______________________________ Lionel December, M.D.     NR/MEDQ  D:  06/29/2011  T:  06/29/2011  Job:  [id number]

## 2011-11-17 ENCOUNTER — Encounter: Payer: Self-pay | Admitting: Cardiology

## 2011-12-07 DIAGNOSIS — L570 Actinic keratosis: Secondary | ICD-10-CM | POA: Diagnosis not present

## 2011-12-07 DIAGNOSIS — C4442 Squamous cell carcinoma of skin of scalp and neck: Secondary | ICD-10-CM | POA: Diagnosis not present

## 2011-12-07 DIAGNOSIS — Z85828 Personal history of other malignant neoplasm of skin: Secondary | ICD-10-CM | POA: Diagnosis not present

## 2011-12-07 DIAGNOSIS — C44320 Squamous cell carcinoma of skin of unspecified parts of face: Secondary | ICD-10-CM | POA: Diagnosis not present

## 2012-01-19 DIAGNOSIS — I739 Peripheral vascular disease, unspecified: Secondary | ICD-10-CM | POA: Diagnosis not present

## 2012-02-01 DIAGNOSIS — Z85828 Personal history of other malignant neoplasm of skin: Secondary | ICD-10-CM | POA: Diagnosis not present

## 2012-02-01 DIAGNOSIS — L570 Actinic keratosis: Secondary | ICD-10-CM | POA: Diagnosis not present

## 2012-02-01 DIAGNOSIS — D235 Other benign neoplasm of skin of trunk: Secondary | ICD-10-CM | POA: Diagnosis not present

## 2012-04-19 DIAGNOSIS — I739 Peripheral vascular disease, unspecified: Secondary | ICD-10-CM | POA: Diagnosis not present

## 2012-05-13 DIAGNOSIS — J209 Acute bronchitis, unspecified: Secondary | ICD-10-CM | POA: Diagnosis not present

## 2012-05-13 DIAGNOSIS — J069 Acute upper respiratory infection, unspecified: Secondary | ICD-10-CM | POA: Diagnosis not present

## 2012-06-28 DIAGNOSIS — I739 Peripheral vascular disease, unspecified: Secondary | ICD-10-CM | POA: Diagnosis not present

## 2012-09-13 DIAGNOSIS — I739 Peripheral vascular disease, unspecified: Secondary | ICD-10-CM | POA: Diagnosis not present

## 2012-12-06 DIAGNOSIS — I739 Peripheral vascular disease, unspecified: Secondary | ICD-10-CM | POA: Diagnosis not present

## 2013-02-14 DIAGNOSIS — I739 Peripheral vascular disease, unspecified: Secondary | ICD-10-CM | POA: Diagnosis not present

## 2013-05-03 DIAGNOSIS — I739 Peripheral vascular disease, unspecified: Secondary | ICD-10-CM | POA: Diagnosis not present

## 2013-08-21 DIAGNOSIS — I739 Peripheral vascular disease, unspecified: Secondary | ICD-10-CM | POA: Diagnosis not present

## 2013-10-23 DIAGNOSIS — I872 Venous insufficiency (chronic) (peripheral): Secondary | ICD-10-CM | POA: Diagnosis not present

## 2013-10-23 DIAGNOSIS — R609 Edema, unspecified: Secondary | ICD-10-CM | POA: Diagnosis not present

## 2013-10-23 DIAGNOSIS — L570 Actinic keratosis: Secondary | ICD-10-CM | POA: Diagnosis not present

## 2013-11-13 DIAGNOSIS — I872 Venous insufficiency (chronic) (peripheral): Secondary | ICD-10-CM | POA: Diagnosis not present

## 2013-11-19 ENCOUNTER — Emergency Department (HOSPITAL_COMMUNITY)
Admission: EM | Admit: 2013-11-19 | Discharge: 2013-11-19 | Disposition: A | Discharge status: Home / Self Care | Payer: Medicare Other | Attending: Emergency Medicine | Admitting: Emergency Medicine

## 2013-11-19 ENCOUNTER — Encounter (HOSPITAL_COMMUNITY): Payer: Self-pay | Admitting: Emergency Medicine

## 2013-11-19 DIAGNOSIS — I872 Venous insufficiency (chronic) (peripheral): Secondary | ICD-10-CM

## 2013-11-19 DIAGNOSIS — L259 Unspecified contact dermatitis, unspecified cause: Secondary | ICD-10-CM | POA: Diagnosis not present

## 2013-11-19 DIAGNOSIS — R6889 Other general symptoms and signs: Secondary | ICD-10-CM | POA: Diagnosis not present

## 2013-11-19 DIAGNOSIS — I83893 Varicose veins of bilateral lower extremities with other complications: Secondary | ICD-10-CM | POA: Diagnosis not present

## 2013-11-19 DIAGNOSIS — I8310 Varicose veins of unspecified lower extremity with inflammation: Secondary | ICD-10-CM | POA: Diagnosis not present

## 2013-11-19 DIAGNOSIS — S8990XA Unspecified injury of unspecified lower leg, initial encounter: Secondary | ICD-10-CM | POA: Diagnosis not present

## 2013-11-19 DIAGNOSIS — Z8739 Personal history of other diseases of the musculoskeletal system and connective tissue: Secondary | ICD-10-CM | POA: Diagnosis not present

## 2013-11-19 DIAGNOSIS — I83892 Varicose veins of left lower extremities with other complications: Secondary | ICD-10-CM

## 2013-11-19 DIAGNOSIS — Z8701 Personal history of pneumonia (recurrent): Secondary | ICD-10-CM | POA: Diagnosis not present

## 2013-11-19 NOTE — ED Notes (Signed)
Dr. Rene Paci at bedside, pt assisted to standing, walking in room, no bleeding noted, per Dr. Rene Paci, pt able to be discharged, pt assisted to car, no bleeding noted,

## 2013-11-19 NOTE — ED Provider Notes (Signed)
CSN: [id number]     Arrival date & time 11/19/13  1221 History   First MD Initiated Contact with Patient 11/19/13 1229     Chief Complaint  Patient presents with  . Wound Infection   (Consider location/radiation/quality/duration/timing/severity/associated sxs/prior Treatment) HPI Comments: Pt has chronic stasis dermatitis and saw his doctor who recommended he soak his legs in 50/50 bleach to water.  He did that for the first time today and started having profuse bleeding from the top of his left foot.  He states it was squirting out.  He denies pain, trauma or fever.  No other complaints.  He states at home he could not get it stopped and that is why he called the ambulance.    The history is provided by the patient.    Past Medical History  Diagnosis Date  . Arthritis, hip   . Pneumonia 2002, 2010   Past Surgical History  Procedure Laterality Date  . Tonsillectomy      As a child  . Appendectomy      At age 40   No family history on file. History  Substance Use Topics  . Smoking status: Unknown If Ever Smoked  . Smokeless tobacco: Not on file  . Alcohol Use: No    Review of Systems  All other systems reviewed and are negative.    Allergies  Review of patient's allergies indicates no known allergies.  Home Medications   Current Outpatient Rx  Name  Route  Sig  Dispense  Refill  . furosemide (LASIX) 20 MG tablet   Oral   Take 20 mg by mouth as needed.            BP 134/61  Pulse 59  Temp(Src) 97.5 F (36.4 C) (Oral)  Resp 20  Ht 6\' 4"  (1.93 m)  Wt 170 lb (77.111 kg)  BMI 20.70 kg/m2  SpO2 98% Physical Exam  Nursing note and vitals reviewed. Constitutional: He is oriented to person, place, and time. He appears well-developed and well-nourished. No distress.  HENT:  Head: Normocephalic and atraumatic.  Pulmonary/Chest: Effort normal.  Musculoskeletal: He exhibits no tenderness.  Neurological: He is alert and oriented to person, place, and time.   Skin:  Chronic stasis dermatitis with skin changes of the bilateral lower legs right>left  Psychiatric: He has a normal mood and affect. His behavior is normal.    ED Course  Procedures (including critical care time) Labs Review Labs Reviewed - No data to display Imaging Review No results found.  EKG Interpretation   None       MDM   1. Stasis dermatitis of both legs   2. Bleeding from varicose vein, left     Patient here due to bleeding of his foot that started today when he was soaking his legs and 50% water and 50% bleach. He states the blood was squirting out the top of his left foot but upon arrival here at the bleeding has subsided. Patient has chronic stasis dermatitis of his lower extremities but no signs of acute infection.  No sign of bleeding currently and unable to reproduce the bleeding. Otherwise patient has no other complaints. Will attempt to wash the bleach off his lower extremities and recommended never using bleach again.    Gwyneth Sprout, MD 11/19/13 [phone]

## 2013-11-19 NOTE — ED Notes (Signed)
Saline soaked 4x4 applied to both legs and feet.  Feet cleaned to remove blood.  Per doctor's request - patient's legs were gently washed and left to dry.

## 2013-11-19 NOTE — ED Notes (Signed)
assited pt to car, once pt was in car he noticed blood on his bandage to left foot, pt moved back to tx room, bandage removed, bleeding noted to top of left foot, pressure applied, Dr. Anitra Lauth notified,

## 2013-11-19 NOTE — ED Notes (Signed)
Dr. Anitra Lauth at bedside, placed Quick Clot on area bleeding on top of left foot, pressure bandage applied, cms remains intact distal to bandage, no further bleeding noted. Pt and family updated, speaking with Dr. Anitra Lauth

## 2013-11-19 NOTE — ED Notes (Signed)
Pt states he has been going to Dr. Margo Aye for statis dermatitis to both lower legs. He was told to soak his legs in 50 % water and 50% Clorox for 20 minuets a day. States today was the first day he soaked his feet and legs and they started bleeding

## 2013-11-19 NOTE — ED Notes (Signed)
Pt being treated for venous stasis to bilateral lower extremities, was advised by dermatologist to use mixture of 50/50 warm water and clorox to soak both legs in, pt was performing first soak this afternoon and left foot started bleeding after being placed on mixture. Pt arrived to er, no bleeding noted at present, pt has several open areas noted to anterior right lower leg. No drainage noted at present, strong odor of clorox noted in room and on pt. Bilateral lower legs cleaned with normal saline by Vickie NT, pt tolerated well.

## 2013-11-19 NOTE — ED Notes (Signed)
Xeroform placed on open area on anterior area of right lower leg, qauze used to wrap bilaterall lower legs, cms remains intact.

## 2013-11-20 DIAGNOSIS — L97809 Non-pressure chronic ulcer of other part of unspecified lower leg with unspecified severity: Secondary | ICD-10-CM | POA: Diagnosis not present

## 2013-11-20 DIAGNOSIS — I83219 Varicose veins of right lower extremity with both ulcer of unspecified site and inflammation: Secondary | ICD-10-CM | POA: Diagnosis not present

## 2013-11-20 DIAGNOSIS — I872 Venous insufficiency (chronic) (peripheral): Secondary | ICD-10-CM | POA: Diagnosis not present

## 2013-11-22 DIAGNOSIS — L97209 Non-pressure chronic ulcer of unspecified calf with unspecified severity: Secondary | ICD-10-CM | POA: Diagnosis not present

## 2013-11-22 DIAGNOSIS — I872 Venous insufficiency (chronic) (peripheral): Secondary | ICD-10-CM | POA: Diagnosis not present

## 2013-11-24 DIAGNOSIS — L97209 Non-pressure chronic ulcer of unspecified calf with unspecified severity: Secondary | ICD-10-CM | POA: Diagnosis not present

## 2013-11-24 DIAGNOSIS — I872 Venous insufficiency (chronic) (peripheral): Secondary | ICD-10-CM | POA: Diagnosis not present

## 2013-11-27 DIAGNOSIS — I872 Venous insufficiency (chronic) (peripheral): Secondary | ICD-10-CM | POA: Diagnosis not present

## 2013-11-27 DIAGNOSIS — L97209 Non-pressure chronic ulcer of unspecified calf with unspecified severity: Secondary | ICD-10-CM | POA: Diagnosis not present

## 2013-11-29 DIAGNOSIS — I872 Venous insufficiency (chronic) (peripheral): Secondary | ICD-10-CM | POA: Diagnosis not present

## 2013-11-29 DIAGNOSIS — I83219 Varicose veins of right lower extremity with both ulcer of unspecified site and inflammation: Secondary | ICD-10-CM | POA: Diagnosis not present

## 2013-11-29 DIAGNOSIS — L97809 Non-pressure chronic ulcer of other part of unspecified lower leg with unspecified severity: Secondary | ICD-10-CM | POA: Diagnosis not present

## 2013-12-04 DIAGNOSIS — I872 Venous insufficiency (chronic) (peripheral): Secondary | ICD-10-CM | POA: Diagnosis not present

## 2013-12-04 DIAGNOSIS — L97209 Non-pressure chronic ulcer of unspecified calf with unspecified severity: Secondary | ICD-10-CM | POA: Diagnosis not present

## 2013-12-05 DIAGNOSIS — M79609 Pain in unspecified limb: Secondary | ICD-10-CM | POA: Diagnosis not present

## 2013-12-05 DIAGNOSIS — L97509 Non-pressure chronic ulcer of other part of unspecified foot with unspecified severity: Secondary | ICD-10-CM | POA: Diagnosis not present

## 2013-12-05 DIAGNOSIS — L97909 Non-pressure chronic ulcer of unspecified part of unspecified lower leg with unspecified severity: Secondary | ICD-10-CM | POA: Diagnosis not present

## 2013-12-05 DIAGNOSIS — M7989 Other specified soft tissue disorders: Secondary | ICD-10-CM | POA: Diagnosis not present

## 2013-12-06 DIAGNOSIS — L97919 Non-pressure chronic ulcer of unspecified part of right lower leg with unspecified severity: Secondary | ICD-10-CM | POA: Diagnosis not present

## 2013-12-06 DIAGNOSIS — L97929 Non-pressure chronic ulcer of unspecified part of left lower leg with unspecified severity: Secondary | ICD-10-CM | POA: Diagnosis not present

## 2013-12-06 DIAGNOSIS — I83229 Varicose veins of left lower extremity with both ulcer of unspecified site and inflammation: Secondary | ICD-10-CM | POA: Diagnosis not present

## 2013-12-06 DIAGNOSIS — I872 Venous insufficiency (chronic) (peripheral): Secondary | ICD-10-CM | POA: Diagnosis not present

## 2013-12-06 DIAGNOSIS — I83219 Varicose veins of right lower extremity with both ulcer of unspecified site and inflammation: Secondary | ICD-10-CM | POA: Diagnosis not present

## 2013-12-11 DIAGNOSIS — I872 Venous insufficiency (chronic) (peripheral): Secondary | ICD-10-CM | POA: Diagnosis not present

## 2013-12-11 DIAGNOSIS — L97209 Non-pressure chronic ulcer of unspecified calf with unspecified severity: Secondary | ICD-10-CM | POA: Diagnosis not present

## 2013-12-18 DIAGNOSIS — L97209 Non-pressure chronic ulcer of unspecified calf with unspecified severity: Secondary | ICD-10-CM | POA: Diagnosis not present

## 2013-12-18 DIAGNOSIS — I872 Venous insufficiency (chronic) (peripheral): Secondary | ICD-10-CM | POA: Diagnosis not present

## 2013-12-25 DIAGNOSIS — I872 Venous insufficiency (chronic) (peripheral): Secondary | ICD-10-CM | POA: Diagnosis not present

## 2013-12-25 DIAGNOSIS — L97209 Non-pressure chronic ulcer of unspecified calf with unspecified severity: Secondary | ICD-10-CM | POA: Diagnosis not present

## 2013-12-28 DIAGNOSIS — I872 Venous insufficiency (chronic) (peripheral): Secondary | ICD-10-CM | POA: Diagnosis not present

## 2013-12-28 DIAGNOSIS — I83229 Varicose veins of left lower extremity with both ulcer of unspecified site and inflammation: Secondary | ICD-10-CM | POA: Diagnosis not present

## 2013-12-28 DIAGNOSIS — I83219 Varicose veins of right lower extremity with both ulcer of unspecified site and inflammation: Secondary | ICD-10-CM | POA: Diagnosis not present

## 2013-12-28 DIAGNOSIS — I83009 Varicose veins of unspecified lower extremity with ulcer of unspecified site: Secondary | ICD-10-CM | POA: Diagnosis not present

## 2014-01-02 DIAGNOSIS — L97209 Non-pressure chronic ulcer of unspecified calf with unspecified severity: Secondary | ICD-10-CM | POA: Diagnosis not present

## 2014-01-02 DIAGNOSIS — I872 Venous insufficiency (chronic) (peripheral): Secondary | ICD-10-CM | POA: Diagnosis not present

## 2014-01-03 DIAGNOSIS — D485 Neoplasm of uncertain behavior of skin: Secondary | ICD-10-CM | POA: Diagnosis not present

## 2014-01-03 DIAGNOSIS — L98499 Non-pressure chronic ulcer of skin of other sites with unspecified severity: Secondary | ICD-10-CM | POA: Diagnosis not present

## 2014-01-04 DIAGNOSIS — C44621 Squamous cell carcinoma of skin of unspecified upper limb, including shoulder: Secondary | ICD-10-CM | POA: Diagnosis not present

## 2014-01-08 DIAGNOSIS — I872 Venous insufficiency (chronic) (peripheral): Secondary | ICD-10-CM | POA: Diagnosis not present

## 2014-01-08 DIAGNOSIS — L97209 Non-pressure chronic ulcer of unspecified calf with unspecified severity: Secondary | ICD-10-CM | POA: Diagnosis not present

## 2014-01-16 DIAGNOSIS — I872 Venous insufficiency (chronic) (peripheral): Secondary | ICD-10-CM | POA: Diagnosis not present

## 2014-01-16 DIAGNOSIS — L97209 Non-pressure chronic ulcer of unspecified calf with unspecified severity: Secondary | ICD-10-CM | POA: Diagnosis not present

## 2014-01-21 DIAGNOSIS — L97209 Non-pressure chronic ulcer of unspecified calf with unspecified severity: Secondary | ICD-10-CM | POA: Diagnosis not present

## 2014-01-21 DIAGNOSIS — I872 Venous insufficiency (chronic) (peripheral): Secondary | ICD-10-CM | POA: Diagnosis not present

## 2014-01-22 DIAGNOSIS — I872 Venous insufficiency (chronic) (peripheral): Secondary | ICD-10-CM | POA: Diagnosis not present

## 2014-01-22 DIAGNOSIS — L97209 Non-pressure chronic ulcer of unspecified calf with unspecified severity: Secondary | ICD-10-CM | POA: Diagnosis not present

## 2014-01-23 DIAGNOSIS — L97209 Non-pressure chronic ulcer of unspecified calf with unspecified severity: Secondary | ICD-10-CM | POA: Diagnosis not present

## 2014-01-23 DIAGNOSIS — I872 Venous insufficiency (chronic) (peripheral): Secondary | ICD-10-CM | POA: Diagnosis not present

## 2014-01-29 DIAGNOSIS — I872 Venous insufficiency (chronic) (peripheral): Secondary | ICD-10-CM | POA: Diagnosis not present

## 2014-01-29 DIAGNOSIS — L97209 Non-pressure chronic ulcer of unspecified calf with unspecified severity: Secondary | ICD-10-CM | POA: Diagnosis not present

## 2014-02-01 DIAGNOSIS — Z85828 Personal history of other malignant neoplasm of skin: Secondary | ICD-10-CM | POA: Diagnosis not present

## 2014-02-01 DIAGNOSIS — L97809 Non-pressure chronic ulcer of other part of unspecified lower leg with unspecified severity: Secondary | ICD-10-CM | POA: Diagnosis not present

## 2014-02-01 DIAGNOSIS — I83009 Varicose veins of unspecified lower extremity with ulcer of unspecified site: Secondary | ICD-10-CM | POA: Diagnosis not present

## 2014-02-01 DIAGNOSIS — L989 Disorder of the skin and subcutaneous tissue, unspecified: Secondary | ICD-10-CM | POA: Diagnosis not present

## 2014-02-01 DIAGNOSIS — C44721 Squamous cell carcinoma of skin of unspecified lower limb, including hip: Secondary | ICD-10-CM | POA: Diagnosis not present

## 2014-02-01 DIAGNOSIS — I872 Venous insufficiency (chronic) (peripheral): Secondary | ICD-10-CM | POA: Diagnosis not present

## 2014-02-05 DIAGNOSIS — L97209 Non-pressure chronic ulcer of unspecified calf with unspecified severity: Secondary | ICD-10-CM | POA: Diagnosis not present

## 2014-02-05 DIAGNOSIS — I872 Venous insufficiency (chronic) (peripheral): Secondary | ICD-10-CM | POA: Diagnosis not present

## 2014-02-12 DIAGNOSIS — L97209 Non-pressure chronic ulcer of unspecified calf with unspecified severity: Secondary | ICD-10-CM | POA: Diagnosis not present

## 2014-02-12 DIAGNOSIS — I872 Venous insufficiency (chronic) (peripheral): Secondary | ICD-10-CM | POA: Diagnosis not present

## 2014-02-20 DIAGNOSIS — I872 Venous insufficiency (chronic) (peripheral): Secondary | ICD-10-CM | POA: Diagnosis not present

## 2014-02-20 DIAGNOSIS — L97209 Non-pressure chronic ulcer of unspecified calf with unspecified severity: Secondary | ICD-10-CM | POA: Diagnosis not present

## 2014-02-27 DIAGNOSIS — I872 Venous insufficiency (chronic) (peripheral): Secondary | ICD-10-CM | POA: Diagnosis not present

## 2014-02-27 DIAGNOSIS — L97209 Non-pressure chronic ulcer of unspecified calf with unspecified severity: Secondary | ICD-10-CM | POA: Diagnosis not present

## 2014-03-01 DIAGNOSIS — I872 Venous insufficiency (chronic) (peripheral): Secondary | ICD-10-CM | POA: Diagnosis not present

## 2014-03-01 DIAGNOSIS — Z85828 Personal history of other malignant neoplasm of skin: Secondary | ICD-10-CM | POA: Diagnosis not present

## 2014-03-09 DIAGNOSIS — I872 Venous insufficiency (chronic) (peripheral): Secondary | ICD-10-CM | POA: Diagnosis not present

## 2014-03-09 DIAGNOSIS — L97209 Non-pressure chronic ulcer of unspecified calf with unspecified severity: Secondary | ICD-10-CM | POA: Diagnosis not present

## 2014-03-15 DIAGNOSIS — I872 Venous insufficiency (chronic) (peripheral): Secondary | ICD-10-CM | POA: Diagnosis not present

## 2014-03-15 DIAGNOSIS — L97209 Non-pressure chronic ulcer of unspecified calf with unspecified severity: Secondary | ICD-10-CM | POA: Diagnosis not present

## 2014-03-19 DIAGNOSIS — L980 Pyogenic granuloma: Secondary | ICD-10-CM | POA: Diagnosis not present

## 2014-03-20 DIAGNOSIS — I872 Venous insufficiency (chronic) (peripheral): Secondary | ICD-10-CM | POA: Diagnosis not present

## 2014-03-20 DIAGNOSIS — L97209 Non-pressure chronic ulcer of unspecified calf with unspecified severity: Secondary | ICD-10-CM | POA: Diagnosis not present

## 2014-03-22 DIAGNOSIS — I872 Venous insufficiency (chronic) (peripheral): Secondary | ICD-10-CM | POA: Diagnosis not present

## 2014-03-22 DIAGNOSIS — L97209 Non-pressure chronic ulcer of unspecified calf with unspecified severity: Secondary | ICD-10-CM | POA: Diagnosis not present

## 2014-03-27 DIAGNOSIS — I872 Venous insufficiency (chronic) (peripheral): Secondary | ICD-10-CM | POA: Diagnosis not present

## 2014-03-27 DIAGNOSIS — L97209 Non-pressure chronic ulcer of unspecified calf with unspecified severity: Secondary | ICD-10-CM | POA: Diagnosis not present

## 2014-04-02 DIAGNOSIS — L97209 Non-pressure chronic ulcer of unspecified calf with unspecified severity: Secondary | ICD-10-CM | POA: Diagnosis not present

## 2014-04-02 DIAGNOSIS — I872 Venous insufficiency (chronic) (peripheral): Secondary | ICD-10-CM | POA: Diagnosis not present

## 2014-04-03 DIAGNOSIS — L97209 Non-pressure chronic ulcer of unspecified calf with unspecified severity: Secondary | ICD-10-CM | POA: Diagnosis not present

## 2014-04-03 DIAGNOSIS — I872 Venous insufficiency (chronic) (peripheral): Secondary | ICD-10-CM | POA: Diagnosis not present

## 2014-04-10 DIAGNOSIS — I872 Venous insufficiency (chronic) (peripheral): Secondary | ICD-10-CM | POA: Diagnosis not present

## 2014-04-10 DIAGNOSIS — L97209 Non-pressure chronic ulcer of unspecified calf with unspecified severity: Secondary | ICD-10-CM | POA: Diagnosis not present

## 2014-04-17 DIAGNOSIS — L97209 Non-pressure chronic ulcer of unspecified calf with unspecified severity: Secondary | ICD-10-CM | POA: Diagnosis not present

## 2014-04-17 DIAGNOSIS — I872 Venous insufficiency (chronic) (peripheral): Secondary | ICD-10-CM | POA: Diagnosis not present

## 2014-04-19 DIAGNOSIS — L980 Pyogenic granuloma: Secondary | ICD-10-CM | POA: Diagnosis not present

## 2014-04-19 DIAGNOSIS — Z85828 Personal history of other malignant neoplasm of skin: Secondary | ICD-10-CM | POA: Diagnosis not present

## 2014-04-19 DIAGNOSIS — I872 Venous insufficiency (chronic) (peripheral): Secondary | ICD-10-CM | POA: Diagnosis not present

## 2014-04-24 DIAGNOSIS — L97209 Non-pressure chronic ulcer of unspecified calf with unspecified severity: Secondary | ICD-10-CM | POA: Diagnosis not present

## 2014-04-24 DIAGNOSIS — I872 Venous insufficiency (chronic) (peripheral): Secondary | ICD-10-CM | POA: Diagnosis not present

## 2014-05-02 DIAGNOSIS — L97209 Non-pressure chronic ulcer of unspecified calf with unspecified severity: Secondary | ICD-10-CM | POA: Diagnosis not present

## 2014-05-02 DIAGNOSIS — I872 Venous insufficiency (chronic) (peripheral): Secondary | ICD-10-CM | POA: Diagnosis not present

## 2014-05-07 DIAGNOSIS — L97209 Non-pressure chronic ulcer of unspecified calf with unspecified severity: Secondary | ICD-10-CM | POA: Diagnosis not present

## 2014-05-07 DIAGNOSIS — I872 Venous insufficiency (chronic) (peripheral): Secondary | ICD-10-CM | POA: Diagnosis not present

## 2014-05-18 DIAGNOSIS — L97209 Non-pressure chronic ulcer of unspecified calf with unspecified severity: Secondary | ICD-10-CM | POA: Diagnosis not present

## 2014-05-18 DIAGNOSIS — I872 Venous insufficiency (chronic) (peripheral): Secondary | ICD-10-CM | POA: Diagnosis not present

## 2014-05-21 DIAGNOSIS — L97209 Non-pressure chronic ulcer of unspecified calf with unspecified severity: Secondary | ICD-10-CM | POA: Diagnosis not present

## 2014-05-21 DIAGNOSIS — Z85828 Personal history of other malignant neoplasm of skin: Secondary | ICD-10-CM | POA: Diagnosis not present

## 2014-05-21 DIAGNOSIS — I872 Venous insufficiency (chronic) (peripheral): Secondary | ICD-10-CM | POA: Diagnosis not present

## 2014-05-21 DIAGNOSIS — L97309 Non-pressure chronic ulcer of unspecified ankle with unspecified severity: Secondary | ICD-10-CM | POA: Diagnosis not present

## 2014-05-23 DIAGNOSIS — L97209 Non-pressure chronic ulcer of unspecified calf with unspecified severity: Secondary | ICD-10-CM | POA: Diagnosis not present

## 2014-05-23 DIAGNOSIS — L97309 Non-pressure chronic ulcer of unspecified ankle with unspecified severity: Secondary | ICD-10-CM | POA: Diagnosis not present

## 2014-05-23 DIAGNOSIS — I872 Venous insufficiency (chronic) (peripheral): Secondary | ICD-10-CM | POA: Diagnosis not present

## 2014-05-24 DIAGNOSIS — L97209 Non-pressure chronic ulcer of unspecified calf with unspecified severity: Secondary | ICD-10-CM | POA: Diagnosis not present

## 2014-05-24 DIAGNOSIS — L97309 Non-pressure chronic ulcer of unspecified ankle with unspecified severity: Secondary | ICD-10-CM | POA: Diagnosis not present

## 2014-05-24 DIAGNOSIS — I872 Venous insufficiency (chronic) (peripheral): Secondary | ICD-10-CM | POA: Diagnosis not present

## 2014-05-29 DIAGNOSIS — L97209 Non-pressure chronic ulcer of unspecified calf with unspecified severity: Secondary | ICD-10-CM | POA: Diagnosis not present

## 2014-05-29 DIAGNOSIS — L97309 Non-pressure chronic ulcer of unspecified ankle with unspecified severity: Secondary | ICD-10-CM | POA: Diagnosis not present

## 2014-05-29 DIAGNOSIS — I872 Venous insufficiency (chronic) (peripheral): Secondary | ICD-10-CM | POA: Diagnosis not present

## 2014-06-08 DIAGNOSIS — L97209 Non-pressure chronic ulcer of unspecified calf with unspecified severity: Secondary | ICD-10-CM | POA: Diagnosis not present

## 2014-06-08 DIAGNOSIS — L97309 Non-pressure chronic ulcer of unspecified ankle with unspecified severity: Secondary | ICD-10-CM | POA: Diagnosis not present

## 2014-06-08 DIAGNOSIS — I872 Venous insufficiency (chronic) (peripheral): Secondary | ICD-10-CM | POA: Diagnosis not present

## 2014-06-14 DIAGNOSIS — L97309 Non-pressure chronic ulcer of unspecified ankle with unspecified severity: Secondary | ICD-10-CM | POA: Diagnosis not present

## 2014-06-14 DIAGNOSIS — L97209 Non-pressure chronic ulcer of unspecified calf with unspecified severity: Secondary | ICD-10-CM | POA: Diagnosis not present

## 2014-06-14 DIAGNOSIS — I872 Venous insufficiency (chronic) (peripheral): Secondary | ICD-10-CM | POA: Diagnosis not present

## 2014-06-20 DIAGNOSIS — I872 Venous insufficiency (chronic) (peripheral): Secondary | ICD-10-CM | POA: Diagnosis not present

## 2014-06-20 DIAGNOSIS — L97309 Non-pressure chronic ulcer of unspecified ankle with unspecified severity: Secondary | ICD-10-CM | POA: Diagnosis not present

## 2014-06-20 DIAGNOSIS — L97209 Non-pressure chronic ulcer of unspecified calf with unspecified severity: Secondary | ICD-10-CM | POA: Diagnosis not present

## 2014-06-28 DIAGNOSIS — I872 Venous insufficiency (chronic) (peripheral): Secondary | ICD-10-CM | POA: Diagnosis not present

## 2014-06-28 DIAGNOSIS — L97209 Non-pressure chronic ulcer of unspecified calf with unspecified severity: Secondary | ICD-10-CM | POA: Diagnosis not present

## 2014-06-28 DIAGNOSIS — L97309 Non-pressure chronic ulcer of unspecified ankle with unspecified severity: Secondary | ICD-10-CM | POA: Diagnosis not present

## 2014-07-04 DIAGNOSIS — L97309 Non-pressure chronic ulcer of unspecified ankle with unspecified severity: Secondary | ICD-10-CM | POA: Diagnosis not present

## 2014-07-04 DIAGNOSIS — I872 Venous insufficiency (chronic) (peripheral): Secondary | ICD-10-CM | POA: Diagnosis not present

## 2014-07-04 DIAGNOSIS — L97209 Non-pressure chronic ulcer of unspecified calf with unspecified severity: Secondary | ICD-10-CM | POA: Diagnosis not present

## 2014-07-12 DIAGNOSIS — L97209 Non-pressure chronic ulcer of unspecified calf with unspecified severity: Secondary | ICD-10-CM | POA: Diagnosis not present

## 2014-07-12 DIAGNOSIS — L980 Pyogenic granuloma: Secondary | ICD-10-CM | POA: Diagnosis not present

## 2014-07-12 DIAGNOSIS — I872 Venous insufficiency (chronic) (peripheral): Secondary | ICD-10-CM | POA: Diagnosis not present

## 2014-07-12 DIAGNOSIS — L97309 Non-pressure chronic ulcer of unspecified ankle with unspecified severity: Secondary | ICD-10-CM | POA: Diagnosis not present

## 2014-07-19 DIAGNOSIS — L97309 Non-pressure chronic ulcer of unspecified ankle with unspecified severity: Secondary | ICD-10-CM | POA: Diagnosis not present

## 2014-07-19 DIAGNOSIS — L97209 Non-pressure chronic ulcer of unspecified calf with unspecified severity: Secondary | ICD-10-CM | POA: Diagnosis not present

## 2014-07-19 DIAGNOSIS — I872 Venous insufficiency (chronic) (peripheral): Secondary | ICD-10-CM | POA: Diagnosis not present

## 2014-07-20 DIAGNOSIS — L97211 Non-pressure chronic ulcer of right calf limited to breakdown of skin: Secondary | ICD-10-CM | POA: Diagnosis not present

## 2014-07-20 DIAGNOSIS — I872 Venous insufficiency (chronic) (peripheral): Secondary | ICD-10-CM | POA: Diagnosis not present

## 2014-07-23 DIAGNOSIS — L97211 Non-pressure chronic ulcer of right calf limited to breakdown of skin: Secondary | ICD-10-CM | POA: Diagnosis not present

## 2014-07-23 DIAGNOSIS — I872 Venous insufficiency (chronic) (peripheral): Secondary | ICD-10-CM | POA: Diagnosis not present

## 2014-07-28 ENCOUNTER — Inpatient Hospital Stay (HOSPITAL_COMMUNITY)
Admission: EM | Admit: 2014-07-28 | Discharge: 2014-07-31 | DRG: 603 | Disposition: A | Discharge status: Home / Self Care | Payer: Medicare Other | Attending: Family Medicine | Admitting: Family Medicine

## 2014-07-28 ENCOUNTER — Encounter (HOSPITAL_COMMUNITY): Payer: Self-pay | Admitting: Emergency Medicine

## 2014-07-28 DIAGNOSIS — L03116 Cellulitis of left lower limb: Secondary | ICD-10-CM

## 2014-07-28 DIAGNOSIS — M1610 Unilateral primary osteoarthritis, unspecified hip: Secondary | ICD-10-CM | POA: Diagnosis present

## 2014-07-28 DIAGNOSIS — L03115 Cellulitis of right lower limb: Secondary | ICD-10-CM | POA: Diagnosis present

## 2014-07-28 DIAGNOSIS — I872 Venous insufficiency (chronic) (peripheral): Secondary | ICD-10-CM | POA: Diagnosis present

## 2014-07-28 DIAGNOSIS — L539 Erythematous condition, unspecified: Secondary | ICD-10-CM | POA: Diagnosis not present

## 2014-07-28 DIAGNOSIS — L03119 Cellulitis of unspecified part of limb: Principal | ICD-10-CM

## 2014-07-28 DIAGNOSIS — L02419 Cutaneous abscess of limb, unspecified: Principal | ICD-10-CM

## 2014-07-28 HISTORY — DX: Other specified health status: Z78.9

## 2014-07-28 LAB — BASIC METABOLIC PANEL
ANION GAP: 11 (ref 5–15)
BUN: 18 mg/dL (ref 6–23)
CALCIUM: 9 mg/dL (ref 8.4–10.5)
CO2: 28 mEq/L (ref 19–32)
CREATININE: 1.07 mg/dL (ref 0.50–1.35)
Chloride: 102 mEq/L (ref 96–112)
GFR calc Af Amer: 71 mL/min — ABNORMAL LOW (ref >90)
GFR, EST NON AFRICAN AMERICAN: 61 mL/min — AB (ref >90)
Glucose, Bld: 104 mg/dL — ABNORMAL HIGH (ref 70–99)
Potassium: 4 mEq/L (ref 3.7–5.3)
SODIUM: 141 meq/L (ref 137–147)

## 2014-07-28 LAB — CBC WITH DIFFERENTIAL/PLATELET
BASOS ABS: 0 10*3/uL (ref 0.0–0.1)
BASOS PCT: 0 % (ref 0–1)
EOS PCT: 0 % (ref 0–5)
Eosinophils Absolute: 0 10*3/uL (ref 0.0–0.7)
HCT: 38.7 % — ABNORMAL LOW (ref 39.0–52.0)
Hemoglobin: 12.5 g/dL — ABNORMAL LOW (ref 13.0–17.0)
LYMPHS PCT: 6 % — AB (ref 12–46)
Lymphs Abs: 0.6 10*3/uL — ABNORMAL LOW (ref 0.7–4.0)
MCH: 28.3 pg (ref 26.0–34.0)
MCHC: 32.3 g/dL (ref 30.0–36.0)
MCV: 87.8 fL (ref 78.0–100.0)
MONO ABS: 0.8 10*3/uL (ref 0.1–1.0)
Monocytes Relative: 8 % (ref 3–12)
NEUTROS ABS: 8.3 10*3/uL — AB (ref 1.7–7.7)
Neutrophils Relative %: 86 % — ABNORMAL HIGH (ref 43–77)
PLATELETS: 119 10*3/uL — AB (ref 150–400)
RBC: 4.41 MIL/uL (ref 4.22–5.81)
RDW: 14.4 % (ref 11.5–15.5)
WBC: 9.6 10*3/uL (ref 4.0–10.5)

## 2014-07-28 LAB — LACTIC ACID, PLASMA: LACTIC ACID, VENOUS: 0.9 mmol/L (ref 0.5–2.2)

## 2014-07-28 MED ORDER — DOCUSATE SODIUM 100 MG PO CAPS: 100.0000 mg | ORAL_CAPSULE | Freq: Every day | ORAL | Status: DC | PRN

## 2014-07-28 MED ORDER — ONDANSETRON HCL 4 MG PO TABS: 4.0000 mg | ORAL_TABLET | Freq: Four times a day (QID) | ORAL | Status: DC | PRN

## 2014-07-28 MED ORDER — CLINDAMYCIN PHOSPHATE 600 MG/50ML IV SOLN
600.0000 mg | Freq: Three times a day (TID) | INTRAVENOUS | Status: DC
  Administered 2014-07-28 – 2014-07-31 (×8): 600 mg via INTRAVENOUS
  Filled 2014-07-28 (×12): qty 50

## 2014-07-28 MED ORDER — ACETAMINOPHEN 325 MG PO TABS
650.0000 mg | ORAL_TABLET | Freq: Four times a day (QID) | ORAL | Status: DC | PRN
Start: 2014-07-28 — End: 2014-07-31

## 2014-07-28 MED ORDER — CLINDAMYCIN PHOSPHATE 600 MG/50ML IV SOLN
INTRAVENOUS | Status: AC
  Filled 2014-07-28: qty 50

## 2014-07-28 MED ORDER — CLOBETASOL PROPIONATE 0.05 % EX CREA
1.0000 "application " | TOPICAL_CREAM | Freq: Two times a day (BID) | CUTANEOUS | Status: DC
  Administered 2014-07-28: 1 via TOPICAL
  Filled 2014-07-28: qty 15

## 2014-07-28 MED ORDER — ENOXAPARIN SODIUM 40 MG/0.4ML ~~LOC~~ SOLN
40.0000 mg | SUBCUTANEOUS | Status: DC
  Administered 2014-07-28 – 2014-07-30 (×3): 40 mg via SUBCUTANEOUS
  Filled 2014-07-28 (×3): qty 0.4

## 2014-07-28 MED ORDER — HYDROCODONE-ACETAMINOPHEN 5-325 MG PO TABS: 1.0000 | ORAL_TABLET | ORAL | Status: DC | PRN

## 2014-07-28 MED ORDER — ONDANSETRON HCL 4 MG/2ML IJ SOLN: 4.0000 mg | Freq: Four times a day (QID) | INTRAMUSCULAR | Status: DC | PRN

## 2014-07-28 MED ORDER — ALUM & MAG HYDROXIDE-SIMETH 200-200-20 MG/5ML PO SUSP: 30.0000 mL | Freq: Four times a day (QID) | ORAL | Status: DC | PRN

## 2014-07-28 MED ORDER — ACETAMINOPHEN 650 MG RE SUPP: 650.0000 mg | Freq: Four times a day (QID) | RECTAL | Status: DC | PRN

## 2014-07-28 NOTE — H&P (Signed)
History and Physical  [name] YSA:[id number] DOB: 04/05/1927 DOA: 07/28/2014  Referring physician: Dr Thurnell Garbe, ED physician PCP: Lanette Hampshire, MD   Chief Complaint: Lower leg redness  HPI: [name] is a 80 y.o. male  With a history of Chronic venous insufficiency of lower leg bilaterally. Who presents to the ER with redness of his lower extremities bilaterally that started yesterday and rapidly progress encompass the mid shin to the feet bilaterally. The patient had cellulitis of his lower legs it was not quite as extensive that started approximately 3 weeks ago. The patient was put on doxycycline for 2 weeks and finished that course approximately 2 weeks ago. His legs have been clear up until yesterday when the cellulitis returned. He does have a wound on his right medial lower leg from a biopsy.  Review of Systems:   Pt complains of leg pain.  Pt denies any fevers, chills, nausea, vomiting, abdominal pain, chest pain, shortness of breath, palpitations.  Review of systems are otherwise negative  Past Medical History  Diagnosis Date  . Arthritis, hip   . Pneumonia 2002, 2010   Past Surgical History  Procedure Laterality Date  . Tonsillectomy      As a child  . Appendectomy      At age 89   Social History:  reports that he does not drink alcohol or use illicit drugs. His tobacco history is not on file. Patient lives at home & is able to participate in activities of daily living without assistance  Allergies  Allergen Reactions  . Keflex [Cephalexin]     n/v    History reviewed. No pertinent family history.    Prior to Admission medications   Medication Sig Start Date End Date Taking? Authorizing Provider  clobetasol cream (TEMOVATE) 3.23 % Apply 1 application topically 2 (two) times daily.   Yes Historical Provider, MD  Multiple Vitamins-Minerals (OPTI-VITAMINS PO) Take 1 tablet by mouth daily.   Yes Historical Provider, MD    Physical Exam: BP 150/77   Pulse 73  Temp(Src) 99 F (37.2 C) (Oral)  Resp 14  Ht 6\' 3"  (1.905 m)  Wt 72.576 kg (160 lb)  BMI 20.00 kg/m2  SpO2 100%  General: Elderly Caucasian male. Awake and alert and oriented x3. No acute cardiopulmonary distress.  Eyes: Pupils equal, round, reactive to light. Extraocular muscles are intact. Sclerae anicteric and noninjected.  ENT: External auditory canals are patent and tympanic membranes reflect a good cone of light. Moist mucosal membranes. No mucosal lesions. Teeth in good repair  Neck: Neck supple without lymphadenopathy. No carotid bruits. No masses palpated.  Cardiovascular: Regular rate with normal S1-S2 sounds. No murmurs, rubs, gallops auscultated. No JVD.  Respiratory: Good respiratory effort with no wheezes, rales, rhonchi. Lungs clear to auscultation bilaterally.  Abdomen: Soft, nontender, nondistended. Active bowel sounds. No masses or hepatosplenomegaly  Skin: Dry, warm to touch. 2+ dorsalis pedis and radial pulses. 2 cm wound of the right medial lower leg. Extensive erythema to the mid shin distally it is extremely warm to touch. There is chronic venous stasis changes to the legs bilaterally. Musculoskeletal: All major joints not erythematous nontender.  Psychiatric: Intact judgment and insight.  Neurologic: No focal neurological deficits. Cranial nerves II through XII are grossly intact.           Labs on Admission:  Basic Metabolic Panel:  Recent Labs Lab 07/28/14 1656  NA 141  K 4.0  CL 102  CO2 28  GLUCOSE 104*  BUN 18  CREATININE 1.07  CALCIUM 9.0   Liver Function Tests: No results found for this basename: AST, ALT, ALKPHOS, BILITOT, PROT, ALBUMIN,  in the last 168 hours No results found for this basename: LIPASE, AMYLASE,  in the last 168 hours No results found for this basename: AMMONIA,  in the last 168 hours CBC:  Recent Labs Lab 07/28/14 1656  WBC 9.6  NEUTROABS 8.3*  HGB 12.5*  HCT 38.7*  MCV 87.8  PLT 119*   Cardiac  Enzymes: No results found for this basename: CKTOTAL, CKMB, CKMBINDEX, TROPONINI,  in the last 168 hours  BNP (last 3 results) No results found for this basename: PROBNP,  in the last 8760 hours CBG: No results found for this basename: GLUCAP,  in the last 168 hours  Radiological Exams on Admission: No results found.   Assessment/Plan Present on Admission:  . Bilateral cellulitis of lower leg  #1 cellulitis Due to the rapid progression of the cellulitis, I feel the patient needs to be admitted for observation and IV antibiotics. We'll start the patient on clindamycin 600 mg IV every 8 hours. The area was marked. If there is improvement of the cellulitis, is reasonable the patient being sent home on oral clindamycin.   DVT prophylaxis: Lovenox  Consultants: None  Code Status: Full  Family Communication: Daughter and wife who are in the room.   Disposition Plan: Home following improvement of cellulitis  Time spent: 50 minutes  Loma [place], [place] [facility] Pager [phone]  **Disclaimer: This note may have been dictated with voice recognition software. Similar sounding words can inadvertently be transcribed and this note may contain transcription errors which may not have been corrected upon publication of note.**

## 2014-07-28 NOTE — ED Notes (Signed)
Daughter states wound to left lower leg is from cancer nodule removal in March. PT recently completed doxycycline for cellulitis. RLE is red, warm, and very tender to the touch at foot area.

## 2014-07-28 NOTE — ED Provider Notes (Signed)
CSN: [id number]     Arrival date & time 07/28/14  1525 History   First MD Initiated Contact with Patient 07/28/14 1624     Chief Complaint  Patient presents with  . Rash      HPI Pt was seen at 1630. Per pt's family and pt, c/o gradual onset and worsening of persistent legs "redness" for the past 2 weeks, worse over the past 2 days. Pt's family states pt was seen by his Derm Dr. [place] Crane 2 weeks ago for same, dx bilat LE's cellulitis, rx doxycycline. Pt took 14 days of doxycycline with improvement in his "red legs." Pt's family states 2 days ago pt's RLE "got red and warm again." Endorses worsening symptoms since onset. Denies fevers, no injury, no focal motor weakness.     Past Medical History  Diagnosis Date  . Arthritis, hip   . Pneumonia 2002, 2010   Past Surgical History  Procedure Laterality Date  . Tonsillectomy      As a child  . Appendectomy      At age 22    History  Substance Use Topics  . Smoking status: Unknown If Ever Smoked  . Smokeless tobacco: Not on file  . Alcohol Use: No    Review of Systems ROS: Statement: All systems negative except as marked or noted in the HPI; Constitutional: Negative for fever and chills. ; ; Eyes: Negative for eye pain, redness and discharge. ; ; ENMT: Negative for ear pain, hoarseness, nasal congestion, sinus pressure and sore throat. ; ; Cardiovascular: Negative for chest pain, palpitations, diaphoresis, dyspnea and peripheral edema. ; ; Respiratory: Negative for cough, wheezing and stridor. ; ; Gastrointestinal: Negative for nausea, vomiting, diarrhea, abdominal pain, blood in stool, hematemesis, jaundice and rectal bleeding. . ; ; Genitourinary: Negative for dysuria, flank pain and hematuria. ; ; Musculoskeletal: Negative for back pain and neck pain. Negative for swelling and trauma.; ; Skin: +rash. Negative for pruritus, abrasions, blisters, bruising and skin lesion.; ; Neuro: Negative for headache, lightheadedness and neck stiffness.  Negative for weakness, altered level of consciousness , altered mental status, extremity weakness, paresthesias, involuntary movement, seizure and syncope.      Allergies  Keflex  Home Medications   Prior to Admission medications   Medication Sig Start Date End Date Taking? Authorizing Provider  clobetasol cream (TEMOVATE) 4.09 % Apply 1 application topically 2 (two) times daily.   Yes Historical Provider, MD  Multiple Vitamins-Minerals (OPTI-VITAMINS PO) Take 1 tablet by mouth daily.   Yes Historical Provider, MD   BP 150/77  Pulse 73  Temp(Src) 99 F (37.2 C) (Oral)  Resp 14  Ht 6\' 3"  (1.905 m)  Wt 160 lb (72.576 kg)  BMI 20.00 kg/m2  SpO2 100% Physical Exam 1635: Physical examination:  Nursing notes reviewed; Vital signs and O2 SAT reviewed;  Constitutional: Well developed, Well nourished, Well hydrated, In no acute distress; Head:  Normocephalic, atraumatic; Eyes: EOMI, PERRL, No scleral icterus; ENMT: Mouth and pharynx normal, Mucous membranes moist; Neck: Supple, Full range of motion, No lymphadenopathy; Cardiovascular: Regular rate and rhythm, No gallop; Respiratory: Breath sounds clear & equal bilaterally, No wheezes.  Speaking full sentences with ease, Normal respiratory effort/excursion; Chest: Nontender, Movement normal; Abdomen: Soft, Nontender, Nondistended, Normal bowel sounds; Genitourinary: No CVA tenderness; Extremities: Pulses normal, +RLE erythema, warmth, and tenderness from foot to knee, palpable pedal pulse. Bilat LE's with chronic stasis changes. +LLE anterior tibial area with mild erythema, no warmth. No calf edema or asymmetry.; Neuro: Awake,  alert, vague historian. Major CN grossly intact.  Speech clear. No gross focal motor or sensory deficits in extremities.; Skin: Color normal, Warm, Dry.   ED Course  Procedures     MDM  MDM Reviewed: previous chart, nursing note and vitals Reviewed previous: labs Interpretation: labs    Results for orders placed  during the hospital encounter of 40/98/11  BASIC METABOLIC PANEL      Result Value Ref Range   Sodium 141  137 - 147 mEq/L   Potassium 4.0  3.7 - 5.3 mEq/L   Chloride 102  96 - 112 mEq/L   CO2 28  19 - 32 mEq/L   Glucose, Bld 104 (*) 70 - 99 mg/dL   BUN 18  6 - 23 mg/dL   Creatinine, Ser 1.07  0.50 - 1.35 mg/dL   Calcium 9.0  8.4 - 10.5 mg/dL   GFR calc non Af Amer 61 (*) >90 mL/min   GFR calc Af Amer 71 (*) >90 mL/min   Anion gap 11  5 - 15  CBC WITH DIFFERENTIAL      Result Value Ref Range   WBC 9.6  4.0 - 10.5 K/uL   RBC 4.41  4.22 - 5.81 MIL/uL   Hemoglobin 12.5 (*) 13.0 - 17.0 g/dL   HCT 38.7 (*) 39.0 - 52.0 %   MCV 87.8  78.0 - 100.0 fL   MCH 28.3  26.0 - 34.0 pg   MCHC 32.3  30.0 - 36.0 g/dL   RDW 14.4  11.5 - 15.5 %   Platelets 119 (*) 150 - 400 K/uL   Neutrophils Relative % 86 (*) 43 - 77 %   Neutro Abs 8.3 (*) 1.7 - 7.7 K/uL   Lymphocytes Relative 6 (*) 12 - 46 %   Lymphs Abs 0.6 (*) 0.7 - 4.0 K/uL   Monocytes Relative 8  3 - 12 %   Monocytes Absolute 0.8  0.1 - 1.0 K/uL   Eosinophils Relative 0  0 - 5 %   Eosinophils Absolute 0.0  0.0 - 0.7 K/uL   Basophils Relative 0  0 - 1 %   Basophils Absolute 0.0  0.0 - 0.1 K/uL  LACTIC ACID, PLASMA      Result Value Ref Range   Lactic Acid, Venous 0.9  0.5 - 2.2 mmol/L    1745:  IV vancomycin ordered for cellulitis. Dx and testing d/w pt and family.  Questions answered.  Verb understanding, agreeable to admit. T/C to [facility] Dr. Nehemiah Settle, case discussed, including:  HPI, pertinent PM/SHx, VS/PE, dx testing, ED course and treatment:  Agreeable to admit, requests to write temporary orders, obtain inpt medical bed to team 2.      Francine Graven, DO 07/30/14 [phone]

## 2014-07-28 NOTE — ED Notes (Signed)
Pt reports increase in redness and swelling in BLE since December. Pt denies any sob,cp,productive cough. BLE warm to touch. Moderate swelling noted to bilateral ankles.

## 2014-07-29 DIAGNOSIS — L539 Erythematous condition, unspecified: Secondary | ICD-10-CM | POA: Diagnosis present

## 2014-07-29 DIAGNOSIS — I872 Venous insufficiency (chronic) (peripheral): Secondary | ICD-10-CM | POA: Diagnosis present

## 2014-07-29 DIAGNOSIS — L02419 Cutaneous abscess of limb, unspecified: Secondary | ICD-10-CM | POA: Diagnosis not present

## 2014-07-29 DIAGNOSIS — M1610 Unilateral primary osteoarthritis, unspecified hip: Secondary | ICD-10-CM | POA: Diagnosis present

## 2014-07-29 LAB — BASIC METABOLIC PANEL
ANION GAP: 11 (ref 5–15)
BUN: 14 mg/dL (ref 6–23)
CALCIUM: 8.7 mg/dL (ref 8.4–10.5)
CO2: 28 mEq/L (ref 19–32)
Chloride: 102 mEq/L (ref 96–112)
Creatinine, Ser: 0.98 mg/dL (ref 0.50–1.35)
GFR calc non Af Amer: 73 mL/min — ABNORMAL LOW (ref >90)
GFR, EST AFRICAN AMERICAN: 84 mL/min — AB (ref >90)
Glucose, Bld: 89 mg/dL (ref 70–99)
Potassium: 4.2 mEq/L (ref 3.7–5.3)
SODIUM: 141 meq/L (ref 137–147)

## 2014-07-29 LAB — CBC
HCT: 38.8 % — ABNORMAL LOW (ref 39.0–52.0)
Hemoglobin: 12.3 g/dL — ABNORMAL LOW (ref 13.0–17.0)
MCH: 28 pg (ref 26.0–34.0)
MCHC: 31.7 g/dL (ref 30.0–36.0)
MCV: 88.2 fL (ref 78.0–100.0)
PLATELETS: 122 10*3/uL — AB (ref 150–400)
RBC: 4.4 MIL/uL (ref 4.22–5.81)
RDW: 14.5 % (ref 11.5–15.5)
WBC: 6.6 10*3/uL (ref 4.0–10.5)

## 2014-07-29 MED ORDER — CLOBETASOL PROPIONATE 0.05 % EX OINT
TOPICAL_OINTMENT | Freq: Two times a day (BID) | CUTANEOUS | Status: DC
  Administered 2014-07-29 – 2014-07-30 (×4): via TOPICAL
  Filled 2014-07-29: qty 15

## 2014-07-29 NOTE — Progress Notes (Signed)
Subjective: The patient is fairly alert and comfortable. He does have chronic bilateral venous insufficiency in both legs. He was admitted because of increased redness in lower extremities and small wound on the medial side of left leg. Started on antibiotics  Objective: Vital signs in last 24 hours: Temp:  [98.7 F (37.1 C)-99 F (37.2 C)] 98.7 F (37.1 C) (08/30 0633) Pulse Rate:  [56-73] 56 (08/30 0633) Resp:  [14-18] 18 (08/30 0633) BP: (115-150)/(57-77) 115/61 mmHg (08/30 0633) SpO2:  [97 %-100 %] 99 % (08/30 0633) Weight:  [72.576 kg (160 lb)-77.248 kg (170 lb 4.8 oz)] 77.248 kg (170 lb 4.8 oz) (08/29 2040) Weight change:  Last BM Date: 07/27/14  Intake/Output from previous day: 08/29 0701 - 08/30 0700 In: -  Out: 100 [Urine:100] Intake/Output this shift: Total I/O In: -  Out: 100 [Urine:100]  Physical Exam: General appearance patient is alert and oriented  HEENT negative  Neck supple no JVD or thyroid abnormalities  Lungs clear to P&A  Heart regular rhythm no murmurs  Abdomen no palpable organs or masses  Skin of extremities erythema over lower extremities and pigmented distal extremities   Recent Labs  07/28/14 1656  WBC 9.6  HGB 12.5*  HCT 38.7*  PLT 119*   BMET  Recent Labs  07/28/14 1656  NA 141  K 4.0  CL 102  CO2 28  GLUCOSE 104*  BUN 18  CREATININE 1.07  CALCIUM 9.0    Studies/Results: No results found.  Medications:  . clindamycin (CLEOCIN) IV  600 mg Intravenous 3 times per day  . clobetasol cream  1 application Topical BID  . enoxaparin (LOVENOX) injection  40 mg Subcutaneous Q24H        Assessment/Plan: 1. Bilateral cellulitis of both lower extremities-chronic venous insufficiency in both lower extremities plan to contact continue IV clindamycin every 8 hours-continue previous medications   LOS: 1 day   Khylon Davies G 07/29/2014, 6:51 AM

## 2014-07-29 NOTE — Progress Notes (Signed)
UR completed 

## 2014-07-30 MED ORDER — CLINDAMYCIN PHOSPHATE 1 % EX GEL: Freq: Every day | CUTANEOUS | Status: DC

## 2014-07-30 NOTE — Progress Notes (Signed)
Subjective: The patient is alert and comfortable and had a good night. He does have chronic bilateral venous insufficiency in both legs was bit admitted because redness in lower extremities he remains on antibiotics intravenously  Objective: Vital signs in last 24 hours: Temp:  [97.9 F (36.6 C)-98.7 F (37.1 C)] 97.9 F (36.6 C) (08/30 2031) Pulse Rate:  [56-61] 61 (08/30 2031) Resp:  [18-20] 20 (08/30 2031) BP: (115-121)/(58-75) 121/58 mmHg (08/30 2031) SpO2:  [98 %-99 %] 98 % (08/30 2031) Weight change:  Last BM Date: 07/29/14  Intake/Output from previous day: 08/30 0701 - 08/31 0700 In: 650 [P.O.:600; IV Piggyback:50] Out: 200 [Urine:200] Intake/Output this shift:    Physical Exam: General appearance the patient is alert and oriented  HEENT negative  Neck supple no JVD or thyroid abnormalities  Lungs clear to P&A  Heart regular rhythm no murmurs  Abdomen no palpable organs or masses  Skin of extremities there is erythema over lower extremities and pigmentation and distal extremities   Recent Labs  07/28/14 1656 07/29/14 0547  WBC 9.6 6.6  HGB 12.5* 12.3*  HCT 38.7* 38.8*  PLT 119* 122*   BMET  Recent Labs  07/28/14 1656 07/29/14 0547  NA 141 141  K 4.0 4.2  CL 102 102  CO2 28 28  GLUCOSE 104* 89  BUN 18 14  CREATININE 1.07 0.98  CALCIUM 9.0 8.7    Studies/Results: No results found.  Medications:  . clindamycin (CLEOCIN) IV  600 mg Intravenous 3 times per day  . clobetasol ointment   Topical BID  . enoxaparin (LOVENOX) injection  40 mg Subcutaneous Q24H        Assessment/Plan: 1. Bilateral cellulitis of both lower extremities with chronic venous insufficiency in both legs-plan to continue IV clindamycin and previous medications   LOS: 2 days   Olubunmi Rothenberger G 07/30/2014, 6:05 AM

## 2014-07-30 NOTE — Care Management Note (Addendum)
    Page 1 of 1   07/31/2014     9:28:24 AM CARE MANAGEMENT NOTE 07/31/2014  Patient:  [name], [name]   Account Number:  [id number]  Date Initiated:  07/30/2014  Documentation initiated by:  Jolene Provost  Subjective/Objective Assessment:   Pt is from home with wife. Pt is independent with ADL's, pt's PCP is Dr. Mickie Hillier. Pt has no medication needs prior to admission.     Action/Plan:   Pt plans to discharge home with self care. No CM needs noted at this time.   Anticipated DC Date:  07/30/2014   Anticipated DC Plan:  [place]  CM consult      Choice offered to / List presented to:             Status of service:  Completed, signed off Medicare Important Message given?  YES (If response is "NO", the following Medicare IM given date fields will be blank) Date Medicare IM given:  07/31/2014 Medicare IM given by:  Jolene Provost Date Additional Medicare IM given:   Additional Medicare IM given by:    Discharge Disposition:  HOME/SELF CARE  Per UR Regulation:    If discussed at Long Length of Stay Meetings, dates discussed:    Comments:  07/31/2014 0900 Jolene Provost, RN, MSN, PCCN Pt plans to discharge home today with self care. Pt has no CM needs at this time.  07/30/2014 [place], RN, MSN, [facility]

## 2014-07-31 NOTE — Discharge Summary (Signed)
Physician Discharge Summary  [name] TKP:[id number] DOB: 1927/03/07 DOA: 07/28/2014  PCP: Lanette Hampshire, MD  Admit date: 07/28/2014 Discharge date: 07/31/2014     Discharge Diagnoses:  1. Bilateral cellulitis lower extremities with chronic venous insufficiency of both lower extremities Discharge Condition stable:Disposition: Home Diet recommendation: Regular diet Filed Weights   07/28/14 1548 07/28/14 2040  Weight: 72.576 kg (160 lb) 77.248 kg (170 lb 4.8 oz)    History of present illness:  The patient presented to the emergency room with redness in his lower extremities. He has a history of chronic venous insufficiency of lower legs he'll wound on his right medial lower leg from a biopsy. The patient had been placed on doxycycline for 2 weeks and finished this but the redness in his legs increased he was admitted  Hospital Course:  The patient was placed on bed rest and started on intravenous fluids. And IV clindamycin 600 mg every 8 hours. The area was marked. He remained on this regimen plus the medication listed below in improved. The patient was noted to have extensive erythema of the lower extremities and pigmentation of the distal extremities. This improved Temovate cream was applied to the leg twice daily. Recommended that he have support hose at a later date.   Discharge Instructions Is recommended that the patient continue clindamycin as outpatient and continue Temovate cream. He is to make appointment with primary care physician later this week    Medication List         clobetasol cream 0.05 %  Commonly known as:  TEMOVATE  Apply 1 application topically 2 (two) times daily.     OPTI-VITAMINS PO  Take 1 tablet by mouth daily.       Allergies  Allergen Reactions  . Keflex [Cephalexin]     n/v    The results of significant diagnostics from this hospitalization (including imaging, microbiology, ancillary and laboratory) are listed below for reference.     Significant Diagnostic Studies: No results found.  Microbiology: No results found for this or any previous visit (from the past 240 hour(s)).   Labs: Basic Metabolic Panel:  Recent Labs Lab 07/28/14 1656 07/29/14 0547  NA 141 141  K 4.0 4.2  CL 102 102  CO2 28 28  GLUCOSE 104* 89  BUN 18 14  CREATININE 1.07 0.98  CALCIUM 9.0 8.7   Liver Function Tests: No results found for this basename: AST, ALT, ALKPHOS, BILITOT, PROT, ALBUMIN,  in the last 168 hours No results found for this basename: LIPASE, AMYLASE,  in the last 168 hours No results found for this basename: AMMONIA,  in the last 168 hours CBC:  Recent Labs Lab 07/28/14 1656 07/29/14 0547  WBC 9.6 6.6  NEUTROABS 8.3*  --   HGB 12.5* 12.3*  HCT 38.7* 38.8*  MCV 87.8 88.2  PLT 119* 122*   Cardiac Enzymes: No results found for this basename: CKTOTAL, CKMB, CKMBINDEX, TROPONINI,  in the last 168 hours BNP: BNP (last 3 results) No results found for this basename: PROBNP,  in the last 8760 hours CBG: No results found for this basename: GLUCAP,  in the last 168 hours  Active Problems:   Bilateral cellulitis of lower leg   Time coordinating discharge: 30 minute Signed:  Marjean Donna, MD 07/31/2014, 6:24 AM

## 2014-07-31 NOTE — Progress Notes (Signed)
IV removed w/o difficulty intact.  Pt dressed with some help, discharge teaching concluded with teach back.  Family helped pt with belongings.  Pt assisted to wheelchair and taken to car for discharge.  All questions answered.

## 2014-08-01 DIAGNOSIS — I872 Venous insufficiency (chronic) (peripheral): Secondary | ICD-10-CM | POA: Diagnosis not present

## 2014-08-01 DIAGNOSIS — L97211 Non-pressure chronic ulcer of right calf limited to breakdown of skin: Secondary | ICD-10-CM | POA: Diagnosis not present

## 2014-08-09 DIAGNOSIS — L97211 Non-pressure chronic ulcer of right calf limited to breakdown of skin: Secondary | ICD-10-CM | POA: Diagnosis not present

## 2014-08-09 DIAGNOSIS — I872 Venous insufficiency (chronic) (peripheral): Secondary | ICD-10-CM | POA: Diagnosis not present

## 2014-08-12 ENCOUNTER — Emergency Department (HOSPITAL_COMMUNITY): Payer: Medicare Other

## 2014-08-12 ENCOUNTER — Encounter (HOSPITAL_COMMUNITY): Payer: Self-pay | Admitting: Emergency Medicine

## 2014-08-12 ENCOUNTER — Emergency Department (HOSPITAL_COMMUNITY)
Admission: EM | Admit: 2014-08-12 | Discharge: 2014-08-12 | Disposition: A | Discharge status: Home / Self Care | Payer: Medicare Other | Attending: Emergency Medicine | Admitting: Emergency Medicine

## 2014-08-12 DIAGNOSIS — M12859 Other specific arthropathies, not elsewhere classified, unspecified hip: Secondary | ICD-10-CM | POA: Insufficient documentation

## 2014-08-12 DIAGNOSIS — L03119 Cellulitis of unspecified part of limb: Secondary | ICD-10-CM | POA: Diagnosis not present

## 2014-08-12 DIAGNOSIS — L03115 Cellulitis of right lower limb: Secondary | ICD-10-CM

## 2014-08-12 DIAGNOSIS — M7989 Other specified soft tissue disorders: Secondary | ICD-10-CM | POA: Diagnosis not present

## 2014-08-12 DIAGNOSIS — Z8701 Personal history of pneumonia (recurrent): Secondary | ICD-10-CM | POA: Insufficient documentation

## 2014-08-12 DIAGNOSIS — IMO0002 Reserved for concepts with insufficient information to code with codable children: Secondary | ICD-10-CM | POA: Insufficient documentation

## 2014-08-12 DIAGNOSIS — L03116 Cellulitis of left lower limb: Secondary | ICD-10-CM

## 2014-08-12 DIAGNOSIS — R609 Edema, unspecified: Secondary | ICD-10-CM | POA: Insufficient documentation

## 2014-08-12 DIAGNOSIS — M1710 Unilateral primary osteoarthritis, unspecified knee: Secondary | ICD-10-CM | POA: Diagnosis not present

## 2014-08-12 DIAGNOSIS — L02419 Cutaneous abscess of limb, unspecified: Secondary | ICD-10-CM | POA: Diagnosis not present

## 2014-08-12 DIAGNOSIS — M79609 Pain in unspecified limb: Secondary | ICD-10-CM | POA: Diagnosis not present

## 2014-08-12 LAB — BASIC METABOLIC PANEL
Anion gap: 9 (ref 5–15)
BUN: 16 mg/dL (ref 6–23)
CHLORIDE: 103 meq/L (ref 96–112)
CO2: 29 mEq/L (ref 19–32)
Calcium: 9.3 mg/dL (ref 8.4–10.5)
Creatinine, Ser: 1.11 mg/dL (ref 0.50–1.35)
GFR calc non Af Amer: 59 mL/min — ABNORMAL LOW (ref >90)
GFR, EST AFRICAN AMERICAN: 68 mL/min — AB (ref >90)
Glucose, Bld: 111 mg/dL — ABNORMAL HIGH (ref 70–99)
POTASSIUM: 4.2 meq/L (ref 3.7–5.3)
Sodium: 141 mEq/L (ref 137–147)

## 2014-08-12 LAB — CBC WITH DIFFERENTIAL/PLATELET
BASOS PCT: 0 % (ref 0–1)
Basophils Absolute: 0 10*3/uL (ref 0.0–0.1)
Eosinophils Absolute: 0.1 10*3/uL (ref 0.0–0.7)
Eosinophils Relative: 1 % (ref 0–5)
HCT: 42 % (ref 39.0–52.0)
Hemoglobin: 13.6 g/dL (ref 13.0–17.0)
LYMPHS ABS: 0.8 10*3/uL (ref 0.7–4.0)
Lymphocytes Relative: 14 % (ref 12–46)
MCH: 28.4 pg (ref 26.0–34.0)
MCHC: 32.4 g/dL (ref 30.0–36.0)
MCV: 87.7 fL (ref 78.0–100.0)
Monocytes Absolute: 0.5 10*3/uL (ref 0.1–1.0)
Monocytes Relative: 9 % (ref 3–12)
NEUTROS ABS: 4.3 10*3/uL (ref 1.7–7.7)
NEUTROS PCT: 76 % (ref 43–77)
Platelets: 167 10*3/uL (ref 150–400)
RBC: 4.79 MIL/uL (ref 4.22–5.81)
RDW: 14.2 % (ref 11.5–15.5)
WBC: 5.7 10*3/uL (ref 4.0–10.5)

## 2014-08-12 LAB — PRO B NATRIURETIC PEPTIDE: Pro B Natriuretic peptide (BNP): 122 pg/mL (ref 0–450)

## 2014-08-12 MED ORDER — SULFAMETHOXAZOLE-TRIMETHOPRIM 800-160 MG PO TABS: 1.0000 | ORAL_TABLET | Freq: Two times a day (BID) | ORAL | Status: DC

## 2014-08-12 MED ORDER — SULFAMETHOXAZOLE-TMP DS 800-160 MG PO TABS
1.0000 | ORAL_TABLET | Freq: Once | ORAL | Status: AC
  Administered 2014-08-12: 1 via ORAL
  Filled 2014-08-12: qty 1

## 2014-08-12 MED ORDER — AMOXICILLIN 250 MG PO CAPS
500.0000 mg | ORAL_CAPSULE | Freq: Once | ORAL | Status: AC
  Administered 2014-08-12: 500 mg via ORAL
  Filled 2014-08-12: qty 2

## 2014-08-12 MED ORDER — SULFAMETHOXAZOLE-TRIMETHOPRIM 200-40 MG/5ML PO SUSP: 20.0000 mL | Freq: Once | ORAL | Status: DC

## 2014-08-12 MED ORDER — AMOXICILLIN 500 MG PO CAPS: 500.0000 mg | ORAL_CAPSULE | Freq: Three times a day (TID) | ORAL | Status: DC

## 2014-08-12 NOTE — ED Notes (Signed)
Redness and swelling present on pt's lower left leg.  Pt unsure of when symptoms started, but noticed swelling yesterday.  Denies fever/chills.  Denies any trouble breathing.  Pt's skin on left lower leg is dry and flaking, with some open areas.  Pt's right leg is also dry, flaking, and discolored, but not swollen.  Edema present in both feet.  Wound present (from nodule removal in March 2015) on pt's right leg on inner calf.  Pt's daughter reports pt's dry, flaky, discolored skin is normal due to periph. Vascular disease. NAD, respirations equal and unlabored, skin warm and dry.

## 2014-08-12 NOTE — ED Notes (Signed)
Pt with cellulitis to left lower leg, began burning yesterday, pt denies fever or N/V

## 2014-08-12 NOTE — ED Provider Notes (Signed)
CSN: [id number]     Arrival date & time 08/12/14  1439 History   This chart was scribed for Orpah Greek, MD, by Neta Ehlers, ED Scribe. This patient was seen in room APA09/APA09 and the patient's care was started at 2:48 PM.  First MD Initiated Contact with Patient 08/12/14 1445     Chief Complaint  Patient presents with  . Cellulitis    The history is provided by the patient. No language interpreter was used.   HPI Comments: [name] is a 80 y.o. male who presents to the Emergency Department complaining of cellulitis to his left lower leg which he noticed had worsened yesterday. He also has similar symptoms to his right lower leg. The sites are associated with redness and swelling. [name] endorses a h/o similar symptoms; he was treated in the ED two weeks ago for similar symptoms; his daughter reports the legs are worse than normal.  He also reports a wound to his right lower leg which drains normally; the wound is due to a nodule removal six months ago. [name] denies a fever, chills, diaphoresis, abdominal pain, nausea, or myalgia.  [name] is a non-smoker.   Past Medical History  Diagnosis Date  . Arthritis, hip   . Pneumonia 2002, 2010  . Never smoked any substance    Past Surgical History  Procedure Laterality Date  . Tonsillectomy      As a child  . Appendectomy      At age 56   History reviewed. No pertinent family history. History  Substance Use Topics  . Smoking status: Never Smoker   . Smokeless tobacco: Not on file  . Alcohol Use: No    Review of Systems  Constitutional: Negative for fever, chills and diaphoresis.  Cardiovascular: Positive for leg swelling.  Gastrointestinal: Negative for nausea and abdominal pain.  Musculoskeletal: Negative for myalgias.  Skin: Positive for color change and wound.  All other systems reviewed and are negative.   Allergies  Keflex  Home Medications   Prior to Admission medications   Medication  Sig Start Date End Date Taking? Authorizing Provider  clobetasol cream (TEMOVATE) 2.95 % Apply 1 application topically 2 (two) times daily.   Yes Historical Provider, MD  Multiple Vitamins-Minerals (OPTI-VITAMINS PO) Take 1 tablet by mouth daily.   Yes Historical Provider, MD   Triage Vitals: BP 132/84  Pulse 85  Temp(Src) 97.8 F (36.6 C) (Oral)  Resp 18  Ht 6\' 4"  (1.93 m)  Wt 170 lb (77.111 kg)  BMI 20.70 kg/m2  SpO2 98%  Physical Exam  Constitutional: He is oriented to person, place, and time. He appears well-developed and well-nourished. No distress.  HENT:  Head: Normocephalic and atraumatic.  Right Ear: Hearing normal.  Left Ear: Hearing normal.  Nose: Nose normal.  Mouth/Throat: Oropharynx is clear and moist and mucous membranes are normal.  Eyes: Conjunctivae and EOM are normal. Pupils are equal, round, and reactive to light.  Neck: Normal range of motion. Neck supple.  Cardiovascular: Regular rhythm, S1 normal and S2 normal.  Exam reveals no gallop and no friction rub.   No murmur heard. 1+ palpable pulses bilaterally.    Pulmonary/Chest: Effort normal and breath sounds normal. No respiratory distress. He exhibits no tenderness.  Abdominal: Soft. Normal appearance and bowel sounds are normal. There is no hepatosplenomegaly. There is no tenderness. There is no rebound, no guarding, no tenderness at McBurney's point and negative Murphy's sign. No hernia.  Musculoskeletal: Normal  range of motion. He exhibits edema.  Bilateral edema, left greater than right. Bilateral erythema and warmth, left greater than right.   One cm ulcerated area on right lower leg with small amount of yellow drainage.    Neurological: He is alert and oriented to person, place, and time. He has normal strength. No cranial nerve deficit or sensory deficit. Coordination normal. GCS eye subscore is 4. GCS verbal subscore is 5. GCS motor subscore is 6.  Skin: Skin is warm, dry and intact. No rash noted. No  cyanosis.  Psychiatric: He has a normal mood and affect. His speech is normal and behavior is normal. Thought content normal.    ED Course  Procedures (including critical care time)  DIAGNOSTIC STUDIES: Oxygen Saturation is 98% on room air, normal by my interpretation.    COORDINATION OF CARE:  2:56 PM- Discussed treatment plan with patient, and the patient agreed to the plan. The plan includes lab work and imaging.   Labs Review Labs Reviewed  BASIC METABOLIC PANEL - Abnormal; Notable for the following:    Glucose, Bld 111 (*)    GFR calc non Af Amer 59 (*)    GFR calc Af Amer 68 (*)    All other components within normal limits  CBC WITH DIFFERENTIAL  PRO B NATRIURETIC PEPTIDE    Imaging Review Dg Chest 1 View  08/12/2014   CLINICAL DATA:  Lower extremity edema.  EXAM: CHEST - 1 VIEW  COMPARISON:  07/05/2009  FINDINGS: Normal heart size. No pleural effusion or edema. No airspace consolidation. The lungs appear hyperinflated but clear. The visualized bony structures appear intact.  IMPRESSION: 1. No acute cardiopulmonary abnormalities.   Electronically Signed   By: Kerby Moors M.D.   On: 08/12/2014 16:16   Dg Tibia/fibula Left  08/12/2014   CLINICAL DATA:  Right lower leg pain and swelling.  EXAM: LEFT TIBIA AND FIBULA - 2 VIEW  COMPARISON:  None.  FINDINGS: Soft tissue swelling is identified.  There is no evidence of fracture, subluxation or dislocation.  No radiographic evidence of osteomyelitis identified.  Degenerative changes within the knee noted.  IMPRESSION: Soft tissue swelling without acute bony abnormality.   Electronically Signed   By: Hassan Rowan M.D.   On: 08/12/2014 16:18   Dg Tibia/fibula Right  08/12/2014   CLINICAL DATA:  Complains of pain and swelling to left leg.  EXAM: RIGHT TIBIA AND FIBULA - 2 VIEW  COMPARISON:  None.  FINDINGS: There is no evidence of fracture or other focal bone lesions. There is a soft tissue defect along the medial aspect of the distal  lower leg. Gas is identified within the soft tissues. The underlying bone appears intact.  IMPRESSION: 1. No acute bone abnormality. 2. Soft tissue defect and subcutaneous gas and noted within the soft tissues of the medial aspect of the lower leg.   Electronically Signed   By: Kerby Moors M.D.   On: 08/12/2014 16:18     EKG Interpretation None      MDM   Final diagnoses:  Bilateral cellulitis of lower leg   Patient presents to ER for evaluation of redness and swelling of both of his legs. He has had previous cellulitis. Symptoms came up over the last 3 or 4 days. Patient was hospitalized several weeks ago for similar. This is not experiencing any fever. His vital signs are stable. Blood work was entirely normal. X-ray of the right leg did show some gas formation around the area  where there is a chronic ulcer. He has a very tiny amount of drainage, no significant surrounding induration and there is no crepitance associated with it. There is no evidence of osteomyelitis on either x-ray.  Patient did not have any evidence of acute congestive heart failure. I did have a consultation with the hospitalist, Doctor Nehemiah Settle and he was the admitting doctor when he was admitted several weeks ago. Doctor Wellington feels that the legs looked much improved over previous, does not require repeat hospitalization. Has recommended initiating amoxicillin and Bactrim, followup with primary doctor in the office for a recheck.  I personally performed the services described in this documentation, which was scribed in my presence. The recorded information has been reviewed and is accurate.    Orpah Greek, MD 08/12/14 1721

## 2014-08-12 NOTE — Consult Note (Signed)
Consult Note  [name] CBS:[id number] DOB: [birth year] DOA: 08/12/2014  Referring physician: Dr Betsey Holiday, ED physician PCP: Lanette Hampshire, MD   Chief Complaint: Left lower leg swelling and erythema  HPI: [name] is a 80 y.o. male  With a history of chronic venous stasis and arthritis who presents with a history of left lower leg pain and swelling and erythema. Patient has a history of bilateral cellulitis has been on doxycycline and clindamycin for treatment. He was hospitalized approximately 2 weeks ago for this same incident and was released on clindamycin clobetasol. He finished that course of clindamycin a couple days ago. He had recurrence of the cellulitis on the left leg. He does admit to pain due to some small fissures of the skin. No frank open wound on the left leg.   Review of Systems:   Pt complains of leg pain, redness.  Pt denies any fevers, chills, nausea, vomiting, chest pain, abdominal pain, lower extremity edema, purulent discharge.  Review of systems are otherwise negative  Past Medical History  Diagnosis Date  . Arthritis, hip   . Pneumonia 2002, 2010  . Never smoked any substance    Past Surgical History  Procedure Laterality Date  . Tonsillectomy      As a child  . Appendectomy      At age 41   Social History:  reports that he has never smoked. He does not have any smokeless tobacco history on file. He reports that he does not drink alcohol or use illicit drugs. Patient lives at home & is able to participate in activities of daily living  Allergies  Allergen Reactions  . Keflex [Cephalexin]     n/v    History reviewed. No pertinent family history.    Prior to Admission medications   Medication Sig Start Date End Date Taking? Authorizing Provider  clobetasol cream (TEMOVATE) 8.46 % Apply 1 application topically 2 (two) times daily.   Yes Historical Provider, MD  Multiple Vitamins-Minerals (OPTI-VITAMINS PO) Take 1 tablet by mouth daily.    Yes Historical Provider, MD    Physical Exam: BP 116/67  Pulse 61  Temp(Src) 97.8 F (36.6 C) (Oral)  Resp 16  Ht 6\' 4"  (1.93 m)  Wt 77.111 kg (170 lb)  BMI 20.70 kg/m2  SpO2 100%  General: Elderly Caucasian male. Awake and alert and oriented x3. No acute cardiopulmonary distress.  Eyes: Pupils equal, round, reactive to light. Extraocular muscles are intact. Sclerae anicteric and noninjected.  ENT: Moist mucosal membranes. No mucosal lesions. Teeth in good repair  Neck: Neck supple without lymphadenopathy. No carotid bruits. No masses palpated.  Cardiovascular: Regular rate with normal S1-S2 sounds. No murmurs, rubs, gallops auscultated. No JVD.  Respiratory: Good respiratory effort with no wheezes, rales, rhonchi. Lungs clear to auscultation bilaterally.  Abdomen: Soft, nontender, nondistended. Hyperactive bowel sounds. No masses or hepatosplenomegaly  Skin: Dry, warm to touch. 2+ dorsalis pedis and radial pulses. Left lower leg erythema and warmth from the mid shin distally to the ankle. There is no edema present. There is minute fissures in the skin without discharge. The right lower leg demonstrates chronic venous stasis changes with a 1/2 cm open ulceration that is not draining. There is no erythema or warmth to the right lower leg Musculoskeletal: All major joints not erythematous nontender.  Psychiatric: Intact judgment and insight.  Neurologic: No focal neurological deficits. Cranial nerves II through XII are grossly intact.  Labs on Admission:  Basic Metabolic Panel:  Recent Labs Lab 08/12/14 1500  NA 141  K 4.2  CL 103  CO2 29  GLUCOSE 111*  BUN 16  CREATININE 1.11  CALCIUM 9.3   Liver Function Tests: No results found for this basename: AST, ALT, ALKPHOS, BILITOT, PROT, ALBUMIN,  in the last 168 hours No results found for this basename: LIPASE, AMYLASE,  in the last 168 hours No results found for this basename: AMMONIA,  in the last 168  hours CBC:  Recent Labs Lab 08/12/14 1500  WBC 5.7  NEUTROABS 4.3  HGB 13.6  HCT 42.0  MCV 87.7  PLT 167   Cardiac Enzymes: No results found for this basename: CKTOTAL, CKMB, CKMBINDEX, TROPONINI,  in the last 168 hours  BNP (last 3 results)  Recent Labs  08/12/14 1500  PROBNP 122.0   CBG: No results found for this basename: GLUCAP,  in the last 168 hours  Radiological Exams on Admission: Dg Chest 1 View  08/12/2014   CLINICAL DATA:  Lower extremity edema.  EXAM: CHEST - 1 VIEW  COMPARISON:  07/05/2009  FINDINGS: Normal heart size. No pleural effusion or edema. No airspace consolidation. The lungs appear hyperinflated but clear. The visualized bony structures appear intact.  IMPRESSION: 1. No acute cardiopulmonary abnormalities.   Electronically Signed   By: Kerby Moors M.D.   On: 08/12/2014 16:16   Dg Tibia/fibula Left  08/12/2014   CLINICAL DATA:  Right lower leg pain and swelling.  EXAM: LEFT TIBIA AND FIBULA - 2 VIEW  COMPARISON:  None.  FINDINGS: Soft tissue swelling is identified.  There is no evidence of fracture, subluxation or dislocation.  No radiographic evidence of osteomyelitis identified.  Degenerative changes within the knee noted.  IMPRESSION: Soft tissue swelling without acute bony abnormality.   Electronically Signed   By: Hassan Rowan M.D.   On: 08/12/2014 16:18   Dg Tibia/fibula Right  08/12/2014   CLINICAL DATA:  Complains of pain and swelling to left leg.  EXAM: RIGHT TIBIA AND FIBULA - 2 VIEW  COMPARISON:  None.  FINDINGS: There is no evidence of fracture or other focal bone lesions. There is a soft tissue defect along the medial aspect of the distal lower leg. Gas is identified within the soft tissues. The underlying bone appears intact.  IMPRESSION: 1. No acute bone abnormality. 2. Soft tissue defect and subcutaneous gas and noted within the soft tissues of the medial aspect of the lower leg.   Electronically Signed   By: Kerby Moors M.D.   On: 08/12/2014  16:18    Impression and recommendations  #1 left lower leg cellulitis - recurrent As the patient is not nauseated and has no fever white count , this patient can be treated as an outpatient. Would recommend starting the patient on clindamycin or Bactrim and amoxicillin. Certainly, if patient fails this regimen or if begins to develop fevers, hospitalization for an ID consult may be necessary. I would recommend continuing with the topical steroids, as well as dressing changes and wound care for the open ulceration.  Thank you for allowing me to assist in the care of this patient  Time spent: 50 minutes  Loma Boston, [place] [facility] Pager [phone]  **Disclaimer: This note may have been dictated with voice recognition software. Similar sounding words can inadvertently be transcribed and this note may contain transcription errors which may not have been corrected upon publication of note.**

## 2014-08-12 NOTE — Discharge Instructions (Signed)

## 2014-08-13 DIAGNOSIS — L97211 Non-pressure chronic ulcer of right calf limited to breakdown of skin: Secondary | ICD-10-CM | POA: Diagnosis not present

## 2014-08-13 DIAGNOSIS — I872 Venous insufficiency (chronic) (peripheral): Secondary | ICD-10-CM | POA: Diagnosis not present

## 2014-08-21 DIAGNOSIS — L97211 Non-pressure chronic ulcer of right calf limited to breakdown of skin: Secondary | ICD-10-CM | POA: Diagnosis not present

## 2014-08-21 DIAGNOSIS — I872 Venous insufficiency (chronic) (peripheral): Secondary | ICD-10-CM | POA: Diagnosis not present

## 2014-08-27 DIAGNOSIS — L97211 Non-pressure chronic ulcer of right calf limited to breakdown of skin: Secondary | ICD-10-CM | POA: Diagnosis not present

## 2014-08-27 DIAGNOSIS — I872 Venous insufficiency (chronic) (peripheral): Secondary | ICD-10-CM | POA: Diagnosis not present

## 2014-09-06 DIAGNOSIS — L97211 Non-pressure chronic ulcer of right calf limited to breakdown of skin: Secondary | ICD-10-CM | POA: Diagnosis not present

## 2014-09-06 DIAGNOSIS — I872 Venous insufficiency (chronic) (peripheral): Secondary | ICD-10-CM | POA: Diagnosis not present

## 2014-09-10 DIAGNOSIS — L97211 Non-pressure chronic ulcer of right calf limited to breakdown of skin: Secondary | ICD-10-CM | POA: Diagnosis not present

## 2014-09-10 DIAGNOSIS — I872 Venous insufficiency (chronic) (peripheral): Secondary | ICD-10-CM | POA: Diagnosis not present

## 2014-09-13 DIAGNOSIS — I872 Venous insufficiency (chronic) (peripheral): Secondary | ICD-10-CM | POA: Diagnosis not present

## 2014-09-13 DIAGNOSIS — L980 Pyogenic granuloma: Secondary | ICD-10-CM | POA: Diagnosis not present

## 2014-09-17 DIAGNOSIS — I872 Venous insufficiency (chronic) (peripheral): Secondary | ICD-10-CM | POA: Diagnosis not present

## 2014-09-17 DIAGNOSIS — L97211 Non-pressure chronic ulcer of right calf limited to breakdown of skin: Secondary | ICD-10-CM | POA: Diagnosis not present

## 2014-09-18 DIAGNOSIS — L97211 Non-pressure chronic ulcer of right calf limited to breakdown of skin: Secondary | ICD-10-CM | POA: Diagnosis not present

## 2014-09-18 DIAGNOSIS — I872 Venous insufficiency (chronic) (peripheral): Secondary | ICD-10-CM | POA: Diagnosis not present

## 2014-09-20 ENCOUNTER — Encounter: Payer: Self-pay | Admitting: Family Medicine

## 2014-09-20 ENCOUNTER — Ambulatory Visit (INDEPENDENT_AMBULATORY_CARE_PROVIDER_SITE_OTHER): Payer: Medicare Other | Admitting: Family Medicine

## 2014-09-20 VITALS — BP 124/74 | Ht 72.0 in | Wt 167.0 lb

## 2014-09-20 DIAGNOSIS — Z23 Encounter for immunization: Secondary | ICD-10-CM | POA: Diagnosis not present

## 2014-09-20 DIAGNOSIS — K5900 Constipation, unspecified: Secondary | ICD-10-CM

## 2014-09-20 DIAGNOSIS — R600 Localized edema: Secondary | ICD-10-CM

## 2014-09-20 NOTE — Progress Notes (Signed)
   Subjective:    Patient ID: [name], male    DOB: 1927/05/13, 80 y.o.   MRN: [id number]  HPI Patient is here today for a check up.  He would like to get the flu and pneumo shot.  He states he does not have any concerns.  Patient states he feels he is doing good he tries to eat healthy he tries to stay physically active.  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 Cognitive skills doing well passes many cognitive exam. Review of Systems He denies chest tightness pressure pain shortness breath nausea vomiting diarrhea he does relate some joint discomforts in his hips and knees    Objective:   Physical Exam Patient not toxic not respiratory distress neck no masses lungs are clear no crackles respiratory rate normal heart regular abdomen soft extremities no edema skin warm dry  He does have pedal edema in his lower legs along with evidence of previous infection     Assessment & Plan:  Patient doing remarkably well. He did not tolerate statins in the past we will not check lipid profile. I did recommend Hemoccult cards because it has been almost 10 years since her colonoscopy. I don't feel this patient needs a colonoscopy currently. He does have arthritis in his hips but he is getting along well uses Tylenol when necessary

## 2014-09-26 DIAGNOSIS — L97211 Non-pressure chronic ulcer of right calf limited to breakdown of skin: Secondary | ICD-10-CM | POA: Diagnosis not present

## 2014-09-26 DIAGNOSIS — I872 Venous insufficiency (chronic) (peripheral): Secondary | ICD-10-CM | POA: Diagnosis not present

## 2014-10-04 DIAGNOSIS — I872 Venous insufficiency (chronic) (peripheral): Secondary | ICD-10-CM | POA: Diagnosis not present

## 2014-10-04 DIAGNOSIS — L97211 Non-pressure chronic ulcer of right calf limited to breakdown of skin: Secondary | ICD-10-CM | POA: Diagnosis not present

## 2014-10-11 DIAGNOSIS — I8310 Varicose veins of unspecified lower extremity with inflammation: Secondary | ICD-10-CM | POA: Diagnosis not present

## 2014-10-11 DIAGNOSIS — L821 Other seborrheic keratosis: Secondary | ICD-10-CM | POA: Diagnosis not present

## 2014-10-11 DIAGNOSIS — L97211 Non-pressure chronic ulcer of right calf limited to breakdown of skin: Secondary | ICD-10-CM | POA: Diagnosis not present

## 2014-10-11 DIAGNOSIS — I872 Venous insufficiency (chronic) (peripheral): Secondary | ICD-10-CM | POA: Diagnosis not present

## 2014-10-17 DIAGNOSIS — I872 Venous insufficiency (chronic) (peripheral): Secondary | ICD-10-CM | POA: Diagnosis not present

## 2014-10-17 DIAGNOSIS — L97211 Non-pressure chronic ulcer of right calf limited to breakdown of skin: Secondary | ICD-10-CM | POA: Diagnosis not present

## 2014-10-24 DIAGNOSIS — L97211 Non-pressure chronic ulcer of right calf limited to breakdown of skin: Secondary | ICD-10-CM | POA: Diagnosis not present

## 2014-10-24 DIAGNOSIS — I872 Venous insufficiency (chronic) (peripheral): Secondary | ICD-10-CM | POA: Diagnosis not present

## 2014-10-30 DIAGNOSIS — L97211 Non-pressure chronic ulcer of right calf limited to breakdown of skin: Secondary | ICD-10-CM | POA: Diagnosis not present

## 2014-10-30 DIAGNOSIS — I872 Venous insufficiency (chronic) (peripheral): Secondary | ICD-10-CM | POA: Diagnosis not present

## 2014-11-06 DIAGNOSIS — I872 Venous insufficiency (chronic) (peripheral): Secondary | ICD-10-CM | POA: Diagnosis not present

## 2014-11-06 DIAGNOSIS — L97211 Non-pressure chronic ulcer of right calf limited to breakdown of skin: Secondary | ICD-10-CM | POA: Diagnosis not present

## 2014-11-15 DIAGNOSIS — L97211 Non-pressure chronic ulcer of right calf limited to breakdown of skin: Secondary | ICD-10-CM | POA: Diagnosis not present

## 2014-11-15 DIAGNOSIS — I872 Venous insufficiency (chronic) (peripheral): Secondary | ICD-10-CM | POA: Diagnosis not present

## 2014-11-17 DIAGNOSIS — L97211 Non-pressure chronic ulcer of right calf limited to breakdown of skin: Secondary | ICD-10-CM | POA: Diagnosis not present

## 2014-11-17 DIAGNOSIS — I872 Venous insufficiency (chronic) (peripheral): Secondary | ICD-10-CM | POA: Diagnosis not present

## 2014-11-20 DIAGNOSIS — L97211 Non-pressure chronic ulcer of right calf limited to breakdown of skin: Secondary | ICD-10-CM | POA: Diagnosis not present

## 2014-11-20 DIAGNOSIS — I872 Venous insufficiency (chronic) (peripheral): Secondary | ICD-10-CM | POA: Diagnosis not present

## 2014-11-26 DIAGNOSIS — I8310 Varicose veins of unspecified lower extremity with inflammation: Secondary | ICD-10-CM | POA: Diagnosis not present

## 2014-11-27 DIAGNOSIS — I872 Venous insufficiency (chronic) (peripheral): Secondary | ICD-10-CM | POA: Diagnosis not present

## 2014-11-27 DIAGNOSIS — L97211 Non-pressure chronic ulcer of right calf limited to breakdown of skin: Secondary | ICD-10-CM | POA: Diagnosis not present

## 2014-11-30 DIAGNOSIS — I82409 Acute embolism and thrombosis of unspecified deep veins of unspecified lower extremity: Secondary | ICD-10-CM

## 2014-11-30 HISTORY — DX: Acute embolism and thrombosis of unspecified deep veins of unspecified lower extremity: I82.409

## 2014-12-04 DIAGNOSIS — L97211 Non-pressure chronic ulcer of right calf limited to breakdown of skin: Secondary | ICD-10-CM | POA: Diagnosis not present

## 2014-12-04 DIAGNOSIS — I872 Venous insufficiency (chronic) (peripheral): Secondary | ICD-10-CM | POA: Diagnosis not present

## 2014-12-05 DIAGNOSIS — L97211 Non-pressure chronic ulcer of right calf limited to breakdown of skin: Secondary | ICD-10-CM | POA: Diagnosis not present

## 2014-12-05 DIAGNOSIS — I872 Venous insufficiency (chronic) (peripheral): Secondary | ICD-10-CM | POA: Diagnosis not present

## 2014-12-11 DIAGNOSIS — I872 Venous insufficiency (chronic) (peripheral): Secondary | ICD-10-CM | POA: Diagnosis not present

## 2014-12-11 DIAGNOSIS — L97211 Non-pressure chronic ulcer of right calf limited to breakdown of skin: Secondary | ICD-10-CM | POA: Diagnosis not present

## 2014-12-18 DIAGNOSIS — L97211 Non-pressure chronic ulcer of right calf limited to breakdown of skin: Secondary | ICD-10-CM | POA: Diagnosis not present

## 2014-12-18 DIAGNOSIS — I872 Venous insufficiency (chronic) (peripheral): Secondary | ICD-10-CM | POA: Diagnosis not present

## 2014-12-24 ENCOUNTER — Telehealth: Payer: Self-pay | Admitting: *Deleted

## 2014-12-24 DIAGNOSIS — I872 Venous insufficiency (chronic) (peripheral): Secondary | ICD-10-CM | POA: Diagnosis not present

## 2014-12-24 DIAGNOSIS — L97211 Non-pressure chronic ulcer of right calf limited to breakdown of skin: Secondary | ICD-10-CM | POA: Diagnosis not present

## 2014-12-24 NOTE — Telephone Encounter (Signed)
[facility] Nurse from [facility] called to report. Pt's daughter states he had a headache on thurs and then on Friday he was confused and did not recognize who daughter was. Home health advised to go to ED. Pt refused. [name] is with pt now and she states he is stable. He knows name, dob, and who president is. bp 114/80, hr 79, res 18, and 02 on RA 95. Temp 95.2. He does have a wound on leg and Dr. [place] Crane put on  cipro last Wednesday. Consult with Dr. Nicki Reaper. Pt seems stable now. cipro could have caused the confusion. Follow up in 3 -4 weeks with Dr. Nicki Reaper or sooner if any issues. Discussed with [name] from [place].

## 2014-12-25 ENCOUNTER — Ambulatory Visit (INDEPENDENT_AMBULATORY_CARE_PROVIDER_SITE_OTHER): Payer: Medicare Other | Admitting: Family Medicine

## 2014-12-25 ENCOUNTER — Telehealth: Payer: Self-pay | Admitting: Family Medicine

## 2014-12-25 VITALS — BP 122/72 | Ht 72.0 in

## 2014-12-25 DIAGNOSIS — F05 Delirium due to known physiological condition: Secondary | ICD-10-CM

## 2014-12-25 DIAGNOSIS — R413 Other amnesia: Secondary | ICD-10-CM

## 2014-12-25 DIAGNOSIS — R471 Dysarthria and anarthria: Secondary | ICD-10-CM

## 2014-12-25 NOTE — Telephone Encounter (Signed)
Advised Colette that patient needs to be seen today. Colette states she will have him here in 20 minutes.

## 2014-12-25 NOTE — Telephone Encounter (Signed)
Daughter states he had a naggin headache Thursday evening,  Woke Friday morning very confused an didn't know who she was Still doesn't know who she is at this point, only off an on does he  Recognize. She feels he has had a stroke but he will NOT go to the  ED to be seen for it. Daughter is greatly concerned at this point and  Would like to hear from you.

## 2014-12-25 NOTE — Progress Notes (Signed)
   Subjective:    Patient ID: [name], male    DOB: [birth year], 81 y.o.   MRN: [id number]  HPI  Patient arrives with confusion for few days. The patient reportedly had a bad headache last Thursday evening into Friday morning and seemed somewhat confused after that the daughter called we advised him to go to the emergency department to get checked out right away. Apparently the patient refused to go to the ER and they never brought him to the ER. He has remained somewhat confused throughout the whole weekend into this week. He has not run any fever no vomiting. No unilateral numbness or weakness.  He is had a difficult time with his speech he's had difficult time with remembering things he has had a significant change in memory and mental status. Review of Systems     Objective:   Physical Exam  On physical exam he is pleasant. He responds with a smile in a hello Dr. When I asked him specifically he did not know my name even though I am known this gentleman for almost 28 years.  Makes good eye contact has equal strength bilateral. Immediate recall of items 3 for 3. Unable to draw clock. Unable to give his address. Unable to give his location. In addition to this patient had difficult time recalling the 3 items after distraction. Otherwise he answered questions appropriately.  Lungs are clear hearts regular pulse normal extremities no edema skin warm dry mental status change-I don't find evidence of delirium. I find no evidence of a new drug or metabolic problem. We will check some lab work.  I'm highly concerned about the possibility of a stroke either intracerebral hemorrhage or a stroke ischemic. I do not recommend aspirin currently. I recommend CT scan of the head. Patient should be able to do this. I do not feel the patient can do MRI currently. But if CAT scan is negative we will progress with MRI. May end up needing neurology consult. Complex case 25-30 minutes spent with  family.      Assessment & Plan:

## 2014-12-26 ENCOUNTER — Ambulatory Visit (HOSPITAL_COMMUNITY)
Admission: RE | Admit: 2014-12-26 | Discharge: 2014-12-26 | Disposition: A | Discharge status: Home / Self Care | Payer: Medicare Other | Source: Ambulatory Visit | Attending: Family Medicine | Admitting: Family Medicine

## 2014-12-26 DIAGNOSIS — I6782 Cerebral ischemia: Secondary | ICD-10-CM | POA: Diagnosis not present

## 2014-12-26 DIAGNOSIS — R410 Disorientation, unspecified: Secondary | ICD-10-CM | POA: Diagnosis not present

## 2014-12-26 DIAGNOSIS — G319 Degenerative disease of nervous system, unspecified: Secondary | ICD-10-CM | POA: Diagnosis not present

## 2014-12-27 ENCOUNTER — Other Ambulatory Visit: Payer: Self-pay | Admitting: *Deleted

## 2014-12-27 DIAGNOSIS — R413 Other amnesia: Secondary | ICD-10-CM

## 2014-12-27 DIAGNOSIS — R4701 Aphasia: Secondary | ICD-10-CM

## 2014-12-27 LAB — BASIC METABOLIC PANEL
BUN: 22 mg/dL (ref 6–23)
CALCIUM: 8.9 mg/dL (ref 8.4–10.5)
CO2: 30 mEq/L (ref 19–32)
Chloride: 101 mEq/L (ref 96–112)
Creat: 1.09 mg/dL (ref 0.50–1.35)
Glucose, Bld: 105 mg/dL — ABNORMAL HIGH (ref 70–99)
Potassium: 4.5 mEq/L (ref 3.5–5.3)
Sodium: 140 mEq/L (ref 135–145)

## 2014-12-27 LAB — HEPATIC FUNCTION PANEL
ALT: 23 U/L (ref 0–53)
AST: 18 U/L (ref 0–37)
Albumin: 3.5 g/dL (ref 3.5–5.2)
Alkaline Phosphatase: 91 U/L (ref 39–117)
BILIRUBIN INDIRECT: 0.3 mg/dL (ref 0.2–1.2)
Bilirubin, Direct: 0.1 mg/dL (ref 0.0–0.3)
TOTAL PROTEIN: 6 g/dL (ref 6.0–8.3)
Total Bilirubin: 0.4 mg/dL (ref 0.2–1.2)

## 2014-12-27 LAB — CBC WITH DIFFERENTIAL/PLATELET
BASOS PCT: 0 % (ref 0–1)
Basophils Absolute: 0 10*3/uL (ref 0.0–0.1)
Eosinophils Absolute: 0.1 10*3/uL (ref 0.0–0.7)
Eosinophils Relative: 1 % (ref 0–5)
HCT: 39.2 % (ref 39.0–52.0)
Hemoglobin: 12.8 g/dL — ABNORMAL LOW (ref 13.0–17.0)
Lymphocytes Relative: 11 % — ABNORMAL LOW (ref 12–46)
Lymphs Abs: 0.8 10*3/uL (ref 0.7–4.0)
MCH: 27.5 pg (ref 26.0–34.0)
MCHC: 32.7 g/dL (ref 30.0–36.0)
MCV: 84.3 fL (ref 78.0–100.0)
MONO ABS: 0.8 10*3/uL (ref 0.1–1.0)
MPV: 9.2 fL (ref 8.6–12.4)
Monocytes Relative: 11 % (ref 3–12)
Neutro Abs: 5.4 10*3/uL (ref 1.7–7.7)
Neutrophils Relative %: 77 % (ref 43–77)
Platelets: 205 10*3/uL (ref 150–400)
RBC: 4.65 MIL/uL (ref 4.22–5.81)
RDW: 14.6 % (ref 11.5–15.5)
WBC: 7 10*3/uL (ref 4.0–10.5)

## 2014-12-27 NOTE — Progress Notes (Signed)
Patient's daughter notified and verbalized understanding of test results. No further questions. MRI has been set up as well.

## 2014-12-31 DIAGNOSIS — L97211 Non-pressure chronic ulcer of right calf limited to breakdown of skin: Secondary | ICD-10-CM | POA: Diagnosis not present

## 2014-12-31 DIAGNOSIS — I872 Venous insufficiency (chronic) (peripheral): Secondary | ICD-10-CM | POA: Diagnosis not present

## 2015-01-03 ENCOUNTER — Ambulatory Visit (HOSPITAL_COMMUNITY): Payer: Medicare Other

## 2015-01-09 DIAGNOSIS — L97211 Non-pressure chronic ulcer of right calf limited to breakdown of skin: Secondary | ICD-10-CM | POA: Diagnosis not present

## 2015-01-09 DIAGNOSIS — I872 Venous insufficiency (chronic) (peripheral): Secondary | ICD-10-CM | POA: Diagnosis not present

## 2015-01-10 DIAGNOSIS — I872 Venous insufficiency (chronic) (peripheral): Secondary | ICD-10-CM | POA: Diagnosis not present

## 2015-01-15 DIAGNOSIS — I872 Venous insufficiency (chronic) (peripheral): Secondary | ICD-10-CM | POA: Diagnosis not present

## 2015-01-15 DIAGNOSIS — L97211 Non-pressure chronic ulcer of right calf limited to breakdown of skin: Secondary | ICD-10-CM | POA: Diagnosis not present

## 2015-01-16 DIAGNOSIS — Z4800 Encounter for change or removal of nonsurgical wound dressing: Secondary | ICD-10-CM | POA: Diagnosis not present

## 2015-01-16 DIAGNOSIS — I872 Venous insufficiency (chronic) (peripheral): Secondary | ICD-10-CM | POA: Diagnosis not present

## 2015-01-16 DIAGNOSIS — L97221 Non-pressure chronic ulcer of left calf limited to breakdown of skin: Secondary | ICD-10-CM | POA: Diagnosis not present

## 2015-01-16 DIAGNOSIS — L97321 Non-pressure chronic ulcer of left ankle limited to breakdown of skin: Secondary | ICD-10-CM | POA: Diagnosis not present

## 2015-01-16 DIAGNOSIS — L97211 Non-pressure chronic ulcer of right calf limited to breakdown of skin: Secondary | ICD-10-CM | POA: Diagnosis not present

## 2015-01-22 DIAGNOSIS — L97221 Non-pressure chronic ulcer of left calf limited to breakdown of skin: Secondary | ICD-10-CM | POA: Diagnosis not present

## 2015-01-22 DIAGNOSIS — I872 Venous insufficiency (chronic) (peripheral): Secondary | ICD-10-CM | POA: Diagnosis not present

## 2015-01-22 DIAGNOSIS — Z4800 Encounter for change or removal of nonsurgical wound dressing: Secondary | ICD-10-CM | POA: Diagnosis not present

## 2015-01-22 DIAGNOSIS — L97211 Non-pressure chronic ulcer of right calf limited to breakdown of skin: Secondary | ICD-10-CM | POA: Diagnosis not present

## 2015-01-22 DIAGNOSIS — L97321 Non-pressure chronic ulcer of left ankle limited to breakdown of skin: Secondary | ICD-10-CM | POA: Diagnosis not present

## 2015-01-28 DIAGNOSIS — Z4800 Encounter for change or removal of nonsurgical wound dressing: Secondary | ICD-10-CM | POA: Diagnosis not present

## 2015-01-28 DIAGNOSIS — L97221 Non-pressure chronic ulcer of left calf limited to breakdown of skin: Secondary | ICD-10-CM | POA: Diagnosis not present

## 2015-01-28 DIAGNOSIS — I872 Venous insufficiency (chronic) (peripheral): Secondary | ICD-10-CM | POA: Diagnosis not present

## 2015-01-28 DIAGNOSIS — L97321 Non-pressure chronic ulcer of left ankle limited to breakdown of skin: Secondary | ICD-10-CM | POA: Diagnosis not present

## 2015-01-28 DIAGNOSIS — L97211 Non-pressure chronic ulcer of right calf limited to breakdown of skin: Secondary | ICD-10-CM | POA: Diagnosis not present

## 2015-01-28 NOTE — Progress Notes (Signed)
Notified Colette that Dr. Nicki Reaper will observe and discuss further at [address] [facility] March 1st. Colette verbalized understanding.

## 2015-01-29 ENCOUNTER — Ambulatory Visit (INDEPENDENT_AMBULATORY_CARE_PROVIDER_SITE_OTHER): Payer: Medicare Other | Admitting: Family Medicine

## 2015-01-29 ENCOUNTER — Encounter: Payer: Self-pay | Admitting: Family Medicine

## 2015-01-29 VITALS — BP 120/78 | HR 74 | Resp 16

## 2015-01-29 DIAGNOSIS — F09 Unspecified mental disorder due to known physiological condition: Secondary | ICD-10-CM | POA: Diagnosis not present

## 2015-01-29 NOTE — Progress Notes (Signed)
   Subjective:    Patient ID: [name], male    DOB: 12-18-1926, 81 y.o.   MRN: [id number]  HPI  Patient recently had severe confusion that was thought to be a stroke with moderate aphasia but actually he is improved recently with improved verbal skills. He is eating well taking care of himself well but he still has some memory dysfunction.  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 May need to be on medication. Patient follow-up in a month's time  Blood pressure under decent control  Patient is more on top of things today he knows my name he knows worries that he has good verbal skills. I don't recommend putting him through MRI currently

## 2015-01-30 DIAGNOSIS — Z79899 Other long term (current) drug therapy: Secondary | ICD-10-CM | POA: Diagnosis not present

## 2015-01-30 DIAGNOSIS — I83018 Varicose veins of right lower extremity with ulcer other part of lower leg: Secondary | ICD-10-CM | POA: Diagnosis not present

## 2015-01-30 DIAGNOSIS — I83022 Varicose veins of left lower extremity with ulcer of calf: Secondary | ICD-10-CM | POA: Diagnosis not present

## 2015-01-30 DIAGNOSIS — L97819 Non-pressure chronic ulcer of other part of right lower leg with unspecified severity: Secondary | ICD-10-CM | POA: Diagnosis not present

## 2015-01-30 DIAGNOSIS — L97221 Non-pressure chronic ulcer of left calf limited to breakdown of skin: Secondary | ICD-10-CM | POA: Diagnosis not present

## 2015-01-30 DIAGNOSIS — Z7982 Long term (current) use of aspirin: Secondary | ICD-10-CM | POA: Diagnosis not present

## 2015-01-30 DIAGNOSIS — L97812 Non-pressure chronic ulcer of other part of right lower leg with fat layer exposed: Secondary | ICD-10-CM | POA: Diagnosis not present

## 2015-01-30 DIAGNOSIS — L97329 Non-pressure chronic ulcer of left ankle with unspecified severity: Secondary | ICD-10-CM | POA: Diagnosis not present

## 2015-01-30 DIAGNOSIS — I872 Venous insufficiency (chronic) (peripheral): Secondary | ICD-10-CM | POA: Diagnosis not present

## 2015-01-30 DIAGNOSIS — I87313 Chronic venous hypertension (idiopathic) with ulcer of bilateral lower extremity: Secondary | ICD-10-CM | POA: Diagnosis not present

## 2015-01-31 ENCOUNTER — Emergency Department (HOSPITAL_COMMUNITY)
Admission: EM | Admit: 2015-01-31 | Discharge: 2015-01-31 | Disposition: A | Discharge status: Home / Self Care | Payer: Medicare Other | Attending: Emergency Medicine | Admitting: Emergency Medicine

## 2015-01-31 ENCOUNTER — Encounter (HOSPITAL_COMMUNITY): Payer: Self-pay | Admitting: Emergency Medicine

## 2015-01-31 DIAGNOSIS — I872 Venous insufficiency (chronic) (peripheral): Secondary | ICD-10-CM | POA: Diagnosis not present

## 2015-01-31 DIAGNOSIS — Z79899 Other long term (current) drug therapy: Secondary | ICD-10-CM | POA: Insufficient documentation

## 2015-01-31 DIAGNOSIS — Z8701 Personal history of pneumonia (recurrent): Secondary | ICD-10-CM | POA: Diagnosis not present

## 2015-01-31 DIAGNOSIS — Z4800 Encounter for change or removal of nonsurgical wound dressing: Secondary | ICD-10-CM | POA: Insufficient documentation

## 2015-01-31 DIAGNOSIS — M79604 Pain in right leg: Secondary | ICD-10-CM | POA: Insufficient documentation

## 2015-01-31 DIAGNOSIS — L97221 Non-pressure chronic ulcer of left calf limited to breakdown of skin: Secondary | ICD-10-CM | POA: Diagnosis not present

## 2015-01-31 DIAGNOSIS — L97211 Non-pressure chronic ulcer of right calf limited to breakdown of skin: Secondary | ICD-10-CM | POA: Diagnosis not present

## 2015-01-31 DIAGNOSIS — L97321 Non-pressure chronic ulcer of left ankle limited to breakdown of skin: Secondary | ICD-10-CM | POA: Diagnosis not present

## 2015-01-31 DIAGNOSIS — M79605 Pain in left leg: Secondary | ICD-10-CM | POA: Diagnosis not present

## 2015-01-31 NOTE — ED Notes (Signed)
Coban dressings removed from pt's legs.  Additional dressings remain intact.  Pt reports pain easing up.  Capillary refill < 3 seconds in toes.

## 2015-01-31 NOTE — Discharge Instructions (Signed)
Keep your appointment at the wound center on Monday, March 7th. Recheck if you feel the dressings are too tight again.

## 2015-01-31 NOTE — ED Provider Notes (Signed)
CSN: [id number]     Arrival date & time 01/31/15  0200 History   First MD Initiated Contact with Patient 01/31/15 0206     Chief Complaint  Patient presents with  . Leg Pain     (Consider location/radiation/quality/duration/timing/severity/associated sxs/prior Treatment) HPI  Patient has a history of cellulitis of his extremities. He was seen at the wound care clinic on March 2 for some blistering on his lower extremities. He had some pressure wraps placed. He reports he was awakened from sleep about 9:30 AM with "excruciating" pain. He states he feels like the wraps are too tight. He denies any fever. He is unsure if his toes are more swollen than normal. He has not had to have his legs wrapped since December 2014.  PCP Dr Wolfgang [facility] [place] [facility] Dr Debbe Bales  Past Medical History  Diagnosis Date  . Arthritis, hip   . Pneumonia 2002, 2010  . Never smoked any substance    Past Surgical History  Procedure Laterality Date  . Tonsillectomy      As a child  . Appendectomy      At age 73   History reviewed. No pertinent family history. History  Substance Use Topics  . Smoking status: Never Smoker   . Smokeless tobacco: Not on file  . Alcohol Use: No  lives at home  Review of Systems  All other systems reviewed and are negative.     Allergies  Ciprofloxacin and Keflex  Home Medications   Prior to Admission medications   Medication Sig Start Date End Date Taking? Authorizing Provider  clobetasol cream (TEMOVATE) 0.35 % Apply 1 application topically 2 (two) times daily.    Historical Provider, MD  Multiple Vitamins-Minerals (OPTI-VITAMINS PO) Take 1 tablet by mouth daily.    Historical Provider, MD   BP 124/60 mmHg  Pulse 50  Temp(Src) 97.7 F (36.5 C) (Oral)  Resp 20  Ht 6\' 4"  (1.93 m)  Wt 150 lb (68.04 kg)  BMI 18.27 kg/m2  SpO2 95%  Vital signs normal except for bradycardia  Physical Exam  Constitutional: He is oriented to person, place, and  time. He appears well-developed and well-nourished.  Non-toxic appearance. He does not appear ill. No distress.  HENT:  Head: Normocephalic and atraumatic.  Right Ear: External ear normal.  Left Ear: External ear normal.  Nose: Nose normal. No mucosal edema or rhinorrhea.  Mouth/Throat: Oropharynx is clear and moist and mucous membranes are normal. No dental abscesses or uvula swelling.  Eyes: Conjunctivae and EOM are normal. Pupils are equal, round, and reactive to light.  Neck: Normal range of motion and full passive range of motion without pain. Neck supple.  Pulmonary/Chest: Effort normal. No respiratory distress. He has no rhonchi. He exhibits no crepitus.  Abdominal: Normal appearance.  Musculoskeletal: Normal range of motion. He exhibits edema. He exhibits no tenderness.  Patient has wraps on both lower extremities. His toes appear to be swollen. Capillary refills normal. When he was checked at the level of the knee there was swelling noted proximally.   Neurological: He is alert and oriented to person, place, and time. He has normal strength. No cranial nerve deficit.  Skin: Skin is warm, dry and intact. No rash noted. No erythema. No pallor.  Psychiatric: He has a normal mood and affect. His speech is normal and behavior is normal. His mood appears not anxious.  Nursing note and vitals reviewed.      ED Course  Procedures (including critical  care time)   The [place]ra and outer wrap was removed on both lower extremities. The wraps underneath do not appear to be tight. We will observe patient and see if this improves his discomfort.  Recheck at 3:15 patient states he has no pain now. His nurse is going to reapply the [place] and but not as tightly as it was before. His toes appear to be less swollen now also.  Labs Review Labs Reviewed - No data to display  Imaging Review No results found.   EKG Interpretation None      MDM   Final diagnoses:  Leg pain, bilateral    Dressing change or removal, nonsurgical wound    Plan discharge  Rolland Porter, MD, Alanson Aly, MD 01/31/15 [phone]

## 2015-01-31 NOTE — ED Notes (Signed)
Patient was seen at [place] office/wound clinic yesterday and had legs wrapped. States dressing are tight and painful. Reports is being followed for blisters to legs. States had cancerous nodules removed from legs.

## 2015-01-31 NOTE — ED Notes (Signed)
Legs rewrapped loosely with coban.

## 2015-02-07 DIAGNOSIS — Z79899 Other long term (current) drug therapy: Secondary | ICD-10-CM | POA: Diagnosis not present

## 2015-02-07 DIAGNOSIS — I87313 Chronic venous hypertension (idiopathic) with ulcer of bilateral lower extremity: Secondary | ICD-10-CM | POA: Diagnosis not present

## 2015-02-07 DIAGNOSIS — I872 Venous insufficiency (chronic) (peripheral): Secondary | ICD-10-CM | POA: Diagnosis not present

## 2015-02-07 DIAGNOSIS — I83022 Varicose veins of left lower extremity with ulcer of calf: Secondary | ICD-10-CM | POA: Diagnosis not present

## 2015-02-07 DIAGNOSIS — Z7982 Long term (current) use of aspirin: Secondary | ICD-10-CM | POA: Diagnosis not present

## 2015-02-07 DIAGNOSIS — L97329 Non-pressure chronic ulcer of left ankle with unspecified severity: Secondary | ICD-10-CM | POA: Diagnosis not present

## 2015-02-07 DIAGNOSIS — I83018 Varicose veins of right lower extremity with ulcer other part of lower leg: Secondary | ICD-10-CM | POA: Diagnosis not present

## 2015-02-07 DIAGNOSIS — L97221 Non-pressure chronic ulcer of left calf limited to breakdown of skin: Secondary | ICD-10-CM | POA: Diagnosis not present

## 2015-02-07 DIAGNOSIS — L97812 Non-pressure chronic ulcer of other part of right lower leg with fat layer exposed: Secondary | ICD-10-CM | POA: Diagnosis not present

## 2015-02-07 DIAGNOSIS — L97819 Non-pressure chronic ulcer of other part of right lower leg with unspecified severity: Secondary | ICD-10-CM | POA: Diagnosis not present

## 2015-02-13 DIAGNOSIS — L97211 Non-pressure chronic ulcer of right calf limited to breakdown of skin: Secondary | ICD-10-CM | POA: Diagnosis not present

## 2015-02-13 DIAGNOSIS — I872 Venous insufficiency (chronic) (peripheral): Secondary | ICD-10-CM | POA: Diagnosis not present

## 2015-02-13 DIAGNOSIS — Z4800 Encounter for change or removal of nonsurgical wound dressing: Secondary | ICD-10-CM | POA: Diagnosis not present

## 2015-02-13 DIAGNOSIS — L97321 Non-pressure chronic ulcer of left ankle limited to breakdown of skin: Secondary | ICD-10-CM | POA: Diagnosis not present

## 2015-02-13 DIAGNOSIS — L97221 Non-pressure chronic ulcer of left calf limited to breakdown of skin: Secondary | ICD-10-CM | POA: Diagnosis not present

## 2015-02-21 DIAGNOSIS — L97819 Non-pressure chronic ulcer of other part of right lower leg with unspecified severity: Secondary | ICD-10-CM | POA: Diagnosis not present

## 2015-02-21 DIAGNOSIS — L97322 Non-pressure chronic ulcer of left ankle with fat layer exposed: Secondary | ICD-10-CM | POA: Diagnosis not present

## 2015-02-21 DIAGNOSIS — Z79899 Other long term (current) drug therapy: Secondary | ICD-10-CM | POA: Diagnosis not present

## 2015-02-21 DIAGNOSIS — L97329 Non-pressure chronic ulcer of left ankle with unspecified severity: Secondary | ICD-10-CM | POA: Diagnosis not present

## 2015-02-21 DIAGNOSIS — I87313 Chronic venous hypertension (idiopathic) with ulcer of bilateral lower extremity: Secondary | ICD-10-CM | POA: Diagnosis not present

## 2015-02-21 DIAGNOSIS — I872 Venous insufficiency (chronic) (peripheral): Secondary | ICD-10-CM | POA: Diagnosis not present

## 2015-02-21 DIAGNOSIS — Z7982 Long term (current) use of aspirin: Secondary | ICD-10-CM | POA: Diagnosis not present

## 2015-02-28 DIAGNOSIS — L97321 Non-pressure chronic ulcer of left ankle limited to breakdown of skin: Secondary | ICD-10-CM | POA: Diagnosis not present

## 2015-02-28 DIAGNOSIS — I872 Venous insufficiency (chronic) (peripheral): Secondary | ICD-10-CM | POA: Diagnosis not present

## 2015-02-28 DIAGNOSIS — L97221 Non-pressure chronic ulcer of left calf limited to breakdown of skin: Secondary | ICD-10-CM | POA: Diagnosis not present

## 2015-02-28 DIAGNOSIS — L97211 Non-pressure chronic ulcer of right calf limited to breakdown of skin: Secondary | ICD-10-CM | POA: Diagnosis not present

## 2015-02-28 DIAGNOSIS — Z4800 Encounter for change or removal of nonsurgical wound dressing: Secondary | ICD-10-CM | POA: Diagnosis not present

## 2015-03-07 DIAGNOSIS — L97329 Non-pressure chronic ulcer of left ankle with unspecified severity: Secondary | ICD-10-CM | POA: Diagnosis not present

## 2015-03-07 DIAGNOSIS — L97322 Non-pressure chronic ulcer of left ankle with fat layer exposed: Secondary | ICD-10-CM | POA: Diagnosis not present

## 2015-03-07 DIAGNOSIS — I872 Venous insufficiency (chronic) (peripheral): Secondary | ICD-10-CM | POA: Diagnosis not present

## 2015-03-14 DIAGNOSIS — I872 Venous insufficiency (chronic) (peripheral): Secondary | ICD-10-CM | POA: Diagnosis not present

## 2015-03-14 DIAGNOSIS — L97321 Non-pressure chronic ulcer of left ankle limited to breakdown of skin: Secondary | ICD-10-CM | POA: Diagnosis not present

## 2015-03-14 DIAGNOSIS — Z4800 Encounter for change or removal of nonsurgical wound dressing: Secondary | ICD-10-CM | POA: Diagnosis not present

## 2015-03-14 DIAGNOSIS — L97221 Non-pressure chronic ulcer of left calf limited to breakdown of skin: Secondary | ICD-10-CM | POA: Diagnosis not present

## 2015-03-14 DIAGNOSIS — L97211 Non-pressure chronic ulcer of right calf limited to breakdown of skin: Secondary | ICD-10-CM | POA: Diagnosis not present

## 2015-03-17 DIAGNOSIS — L97221 Non-pressure chronic ulcer of left calf limited to breakdown of skin: Secondary | ICD-10-CM | POA: Diagnosis not present

## 2015-03-17 DIAGNOSIS — L97321 Non-pressure chronic ulcer of left ankle limited to breakdown of skin: Secondary | ICD-10-CM | POA: Diagnosis not present

## 2015-03-17 DIAGNOSIS — I872 Venous insufficiency (chronic) (peripheral): Secondary | ICD-10-CM | POA: Diagnosis not present

## 2015-03-17 DIAGNOSIS — L97211 Non-pressure chronic ulcer of right calf limited to breakdown of skin: Secondary | ICD-10-CM | POA: Diagnosis not present

## 2015-03-17 DIAGNOSIS — Z4800 Encounter for change or removal of nonsurgical wound dressing: Secondary | ICD-10-CM | POA: Diagnosis not present

## 2015-03-22 DIAGNOSIS — L97321 Non-pressure chronic ulcer of left ankle limited to breakdown of skin: Secondary | ICD-10-CM | POA: Diagnosis not present

## 2015-03-22 DIAGNOSIS — L97211 Non-pressure chronic ulcer of right calf limited to breakdown of skin: Secondary | ICD-10-CM | POA: Diagnosis not present

## 2015-03-22 DIAGNOSIS — L97221 Non-pressure chronic ulcer of left calf limited to breakdown of skin: Secondary | ICD-10-CM | POA: Diagnosis not present

## 2015-03-22 DIAGNOSIS — Z4800 Encounter for change or removal of nonsurgical wound dressing: Secondary | ICD-10-CM | POA: Diagnosis not present

## 2015-03-22 DIAGNOSIS — I872 Venous insufficiency (chronic) (peripheral): Secondary | ICD-10-CM | POA: Diagnosis not present

## 2015-03-29 DIAGNOSIS — Z4800 Encounter for change or removal of nonsurgical wound dressing: Secondary | ICD-10-CM | POA: Diagnosis not present

## 2015-03-29 DIAGNOSIS — L97211 Non-pressure chronic ulcer of right calf limited to breakdown of skin: Secondary | ICD-10-CM | POA: Diagnosis not present

## 2015-03-29 DIAGNOSIS — I872 Venous insufficiency (chronic) (peripheral): Secondary | ICD-10-CM | POA: Diagnosis not present

## 2015-03-29 DIAGNOSIS — L97221 Non-pressure chronic ulcer of left calf limited to breakdown of skin: Secondary | ICD-10-CM | POA: Diagnosis not present

## 2015-03-29 DIAGNOSIS — L97321 Non-pressure chronic ulcer of left ankle limited to breakdown of skin: Secondary | ICD-10-CM | POA: Diagnosis not present

## 2015-04-01 DIAGNOSIS — L97221 Non-pressure chronic ulcer of left calf limited to breakdown of skin: Secondary | ICD-10-CM | POA: Diagnosis not present

## 2015-04-01 DIAGNOSIS — L97211 Non-pressure chronic ulcer of right calf limited to breakdown of skin: Secondary | ICD-10-CM | POA: Diagnosis not present

## 2015-04-01 DIAGNOSIS — I872 Venous insufficiency (chronic) (peripheral): Secondary | ICD-10-CM | POA: Diagnosis not present

## 2015-04-01 DIAGNOSIS — Z4800 Encounter for change or removal of nonsurgical wound dressing: Secondary | ICD-10-CM | POA: Diagnosis not present

## 2015-04-01 DIAGNOSIS — L97321 Non-pressure chronic ulcer of left ankle limited to breakdown of skin: Secondary | ICD-10-CM | POA: Diagnosis not present

## 2015-04-04 DIAGNOSIS — L97211 Non-pressure chronic ulcer of right calf limited to breakdown of skin: Secondary | ICD-10-CM | POA: Diagnosis not present

## 2015-04-04 DIAGNOSIS — I83023 Varicose veins of left lower extremity with ulcer of ankle: Secondary | ICD-10-CM | POA: Diagnosis not present

## 2015-04-04 DIAGNOSIS — L97329 Non-pressure chronic ulcer of left ankle with unspecified severity: Secondary | ICD-10-CM | POA: Diagnosis not present

## 2015-04-04 DIAGNOSIS — L97321 Non-pressure chronic ulcer of left ankle limited to breakdown of skin: Secondary | ICD-10-CM | POA: Diagnosis not present

## 2015-04-04 DIAGNOSIS — Z4800 Encounter for change or removal of nonsurgical wound dressing: Secondary | ICD-10-CM | POA: Diagnosis not present

## 2015-04-04 DIAGNOSIS — I872 Venous insufficiency (chronic) (peripheral): Secondary | ICD-10-CM | POA: Diagnosis not present

## 2015-04-04 DIAGNOSIS — L97322 Non-pressure chronic ulcer of left ankle with fat layer exposed: Secondary | ICD-10-CM | POA: Diagnosis not present

## 2015-04-04 DIAGNOSIS — L97221 Non-pressure chronic ulcer of left calf limited to breakdown of skin: Secondary | ICD-10-CM | POA: Diagnosis not present

## 2015-04-11 DIAGNOSIS — I872 Venous insufficiency (chronic) (peripheral): Secondary | ICD-10-CM | POA: Diagnosis not present

## 2015-04-11 DIAGNOSIS — L97329 Non-pressure chronic ulcer of left ankle with unspecified severity: Secondary | ICD-10-CM | POA: Diagnosis not present

## 2015-04-11 DIAGNOSIS — L97322 Non-pressure chronic ulcer of left ankle with fat layer exposed: Secondary | ICD-10-CM | POA: Diagnosis not present

## 2015-04-11 DIAGNOSIS — I83023 Varicose veins of left lower extremity with ulcer of ankle: Secondary | ICD-10-CM | POA: Diagnosis not present

## 2015-04-12 DIAGNOSIS — L97221 Non-pressure chronic ulcer of left calf limited to breakdown of skin: Secondary | ICD-10-CM | POA: Diagnosis not present

## 2015-04-12 DIAGNOSIS — Z4800 Encounter for change or removal of nonsurgical wound dressing: Secondary | ICD-10-CM | POA: Diagnosis not present

## 2015-04-12 DIAGNOSIS — L97321 Non-pressure chronic ulcer of left ankle limited to breakdown of skin: Secondary | ICD-10-CM | POA: Diagnosis not present

## 2015-04-12 DIAGNOSIS — I872 Venous insufficiency (chronic) (peripheral): Secondary | ICD-10-CM | POA: Diagnosis not present

## 2015-04-12 DIAGNOSIS — L97211 Non-pressure chronic ulcer of right calf limited to breakdown of skin: Secondary | ICD-10-CM | POA: Diagnosis not present

## 2015-04-18 DIAGNOSIS — L97329 Non-pressure chronic ulcer of left ankle with unspecified severity: Secondary | ICD-10-CM | POA: Diagnosis not present

## 2015-04-18 DIAGNOSIS — I83023 Varicose veins of left lower extremity with ulcer of ankle: Secondary | ICD-10-CM | POA: Diagnosis not present

## 2015-04-18 DIAGNOSIS — L97322 Non-pressure chronic ulcer of left ankle with fat layer exposed: Secondary | ICD-10-CM | POA: Diagnosis not present

## 2015-04-18 DIAGNOSIS — I872 Venous insufficiency (chronic) (peripheral): Secondary | ICD-10-CM | POA: Diagnosis not present

## 2015-04-19 DIAGNOSIS — I872 Venous insufficiency (chronic) (peripheral): Secondary | ICD-10-CM | POA: Diagnosis not present

## 2015-04-19 DIAGNOSIS — L97321 Non-pressure chronic ulcer of left ankle limited to breakdown of skin: Secondary | ICD-10-CM | POA: Diagnosis not present

## 2015-04-19 DIAGNOSIS — Z4800 Encounter for change or removal of nonsurgical wound dressing: Secondary | ICD-10-CM | POA: Diagnosis not present

## 2015-04-19 DIAGNOSIS — L97221 Non-pressure chronic ulcer of left calf limited to breakdown of skin: Secondary | ICD-10-CM | POA: Diagnosis not present

## 2015-04-19 DIAGNOSIS — L97211 Non-pressure chronic ulcer of right calf limited to breakdown of skin: Secondary | ICD-10-CM | POA: Diagnosis not present

## 2015-04-22 DIAGNOSIS — I872 Venous insufficiency (chronic) (peripheral): Secondary | ICD-10-CM | POA: Diagnosis not present

## 2015-04-22 DIAGNOSIS — L97211 Non-pressure chronic ulcer of right calf limited to breakdown of skin: Secondary | ICD-10-CM | POA: Diagnosis not present

## 2015-04-22 DIAGNOSIS — L97221 Non-pressure chronic ulcer of left calf limited to breakdown of skin: Secondary | ICD-10-CM | POA: Diagnosis not present

## 2015-04-22 DIAGNOSIS — L97321 Non-pressure chronic ulcer of left ankle limited to breakdown of skin: Secondary | ICD-10-CM | POA: Diagnosis not present

## 2015-04-22 DIAGNOSIS — Z4800 Encounter for change or removal of nonsurgical wound dressing: Secondary | ICD-10-CM | POA: Diagnosis not present

## 2015-04-25 DIAGNOSIS — I83023 Varicose veins of left lower extremity with ulcer of ankle: Secondary | ICD-10-CM | POA: Diagnosis not present

## 2015-04-25 DIAGNOSIS — I872 Venous insufficiency (chronic) (peripheral): Secondary | ICD-10-CM | POA: Diagnosis not present

## 2015-04-25 DIAGNOSIS — L97329 Non-pressure chronic ulcer of left ankle with unspecified severity: Secondary | ICD-10-CM | POA: Diagnosis not present

## 2015-04-25 DIAGNOSIS — L97322 Non-pressure chronic ulcer of left ankle with fat layer exposed: Secondary | ICD-10-CM | POA: Diagnosis not present

## 2015-07-31 ENCOUNTER — Encounter: Payer: Self-pay | Admitting: Nurse Practitioner

## 2015-07-31 ENCOUNTER — Ambulatory Visit (INDEPENDENT_AMBULATORY_CARE_PROVIDER_SITE_OTHER): Payer: Medicare Other | Admitting: Nurse Practitioner

## 2015-07-31 VITALS — BP 122/70 | Temp 98.3°F | Ht 72.0 in | Wt 164.0 lb

## 2015-07-31 DIAGNOSIS — J069 Acute upper respiratory infection, unspecified: Secondary | ICD-10-CM

## 2015-08-01 ENCOUNTER — Encounter: Payer: Self-pay | Admitting: Nurse Practitioner

## 2015-08-01 NOTE — Progress Notes (Signed)
Subjective:   Presents with his grandson for complaints of head congestion for the past 2 days. No cough. Runny nose. No fever. No sore throat ear pain or wheezing. Grandson noted that he slept more hours than usual last night. Taking fluids well. No other complaints.  Objective:   BP 122/70 mmHg  Temp(Src) 98.3 F (36.8 C) (Oral)  Ht 6' (1.829 m)  Wt 164 lb (74.39 kg)  BMI 22.24 kg/m2  NAD. Alert, oriented. TMs mild clear effusion, no erythema. Pharynx clear. Neck supple with mild soft anterior adenopathy. Lungs clear. Heart regular rate rhythm. No tachypnea. Normal color.  Assessment: Acute upper respiratory infection  Plan: Mucinex as directed for congestion. Reviewed symptomatic care and warning signs. Call back if symptoms worsen or persist.

## 2015-11-01 ENCOUNTER — Emergency Department (HOSPITAL_COMMUNITY): Payer: Medicare Other

## 2015-11-01 ENCOUNTER — Emergency Department (HOSPITAL_COMMUNITY)
Admission: EM | Admit: 2015-11-01 | Discharge: 2015-11-01 | Disposition: A | Discharge status: Home / Self Care | Payer: Medicare Other | Attending: Emergency Medicine | Admitting: Emergency Medicine

## 2015-11-01 ENCOUNTER — Encounter (HOSPITAL_COMMUNITY): Payer: Self-pay | Admitting: *Deleted

## 2015-11-01 DIAGNOSIS — W1839XA Other fall on same level, initial encounter: Secondary | ICD-10-CM | POA: Diagnosis not present

## 2015-11-01 DIAGNOSIS — Y9289 Other specified places as the place of occurrence of the external cause: Secondary | ICD-10-CM | POA: Insufficient documentation

## 2015-11-01 DIAGNOSIS — Z8701 Personal history of pneumonia (recurrent): Secondary | ICD-10-CM | POA: Insufficient documentation

## 2015-11-01 DIAGNOSIS — Y998 Other external cause status: Secondary | ICD-10-CM | POA: Diagnosis not present

## 2015-11-01 DIAGNOSIS — M25512 Pain in left shoulder: Secondary | ICD-10-CM | POA: Diagnosis not present

## 2015-11-01 DIAGNOSIS — S29001A Unspecified injury of muscle and tendon of front wall of thorax, initial encounter: Secondary | ICD-10-CM | POA: Insufficient documentation

## 2015-11-01 DIAGNOSIS — R079 Chest pain, unspecified: Secondary | ICD-10-CM | POA: Diagnosis not present

## 2015-11-01 DIAGNOSIS — Y9389 Activity, other specified: Secondary | ICD-10-CM | POA: Diagnosis not present

## 2015-11-01 DIAGNOSIS — Z8739 Personal history of other diseases of the musculoskeletal system and connective tissue: Secondary | ICD-10-CM | POA: Diagnosis not present

## 2015-11-01 DIAGNOSIS — W19XXXA Unspecified fall, initial encounter: Secondary | ICD-10-CM

## 2015-11-01 LAB — CBC WITH DIFFERENTIAL/PLATELET
BASOS PCT: 0 %
Basophils Absolute: 0 10*3/uL (ref 0.0–0.1)
EOS ABS: 0.1 10*3/uL (ref 0.0–0.7)
Eosinophils Relative: 2 %
HCT: 40.5 % (ref 39.0–52.0)
HEMOGLOBIN: 12.9 g/dL — AB (ref 13.0–17.0)
LYMPHS ABS: 0.8 10*3/uL (ref 0.7–4.0)
Lymphocytes Relative: 13 %
MCH: 27.5 pg (ref 26.0–34.0)
MCHC: 31.9 g/dL (ref 30.0–36.0)
MCV: 86.4 fL (ref 78.0–100.0)
Monocytes Absolute: 0.9 10*3/uL (ref 0.1–1.0)
Monocytes Relative: 15 %
NEUTROS ABS: 4.3 10*3/uL (ref 1.7–7.7)
NEUTROS PCT: 70 %
Platelets: 154 10*3/uL (ref 150–400)
RBC: 4.69 MIL/uL (ref 4.22–5.81)
RDW: 14.4 % (ref 11.5–15.5)
WBC: 6.1 10*3/uL (ref 4.0–10.5)

## 2015-11-01 LAB — COMPREHENSIVE METABOLIC PANEL
ALBUMIN: 3.8 g/dL (ref 3.5–5.0)
ALK PHOS: 87 U/L (ref 38–126)
ALT: 18 U/L (ref 17–63)
AST: 25 U/L (ref 15–41)
Anion gap: 10 (ref 5–15)
BUN: 25 mg/dL — AB (ref 6–20)
CALCIUM: 9.1 mg/dL (ref 8.9–10.3)
CO2: 28 mmol/L (ref 22–32)
CREATININE: 1.11 mg/dL (ref 0.61–1.24)
Chloride: 101 mmol/L (ref 101–111)
GFR calc Af Amer: >60 mL/min (ref >60)
GFR calc non Af Amer: 58 mL/min — ABNORMAL LOW (ref >60)
GLUCOSE: 107 mg/dL — AB (ref 65–99)
Potassium: 4.2 mmol/L (ref 3.5–5.1)
SODIUM: 139 mmol/L (ref 135–145)
Total Bilirubin: 0.7 mg/dL (ref 0.3–1.2)
Total Protein: 7.3 g/dL (ref 6.5–8.1)

## 2015-11-01 LAB — URINALYSIS W MICROSCOPIC (NOT AT ARMC)
Bilirubin Urine: NEGATIVE
Glucose, UA: NEGATIVE mg/dL
Ketones, ur: NEGATIVE mg/dL
Leukocytes, UA: NEGATIVE
Nitrite: NEGATIVE
PH: 5.5 (ref 5.0–8.0)
Protein, ur: NEGATIVE mg/dL

## 2015-11-01 NOTE — ED Notes (Signed)
Pt fell on tile floor on Sunday, per family member started to c/o pain to entire front of chest esp. With deep breathing

## 2015-11-01 NOTE — ED Provider Notes (Signed)
CSN: [id number]     Arrival date & time 11/01/15  1744 History   First MD Initiated Contact with Patient 11/01/15 1750     Chief Complaint  Patient presents with  . Fall     (Consider location/radiation/quality/duration/timing/severity/associated sxs/prior Treatment) Patient is a 81 y.o. male presenting with fall.  Fall This is a new problem. The current episode started yesterday. The problem occurs rarely. The problem has been resolved. Associated symptoms include chest pain. Pertinent negatives include no abdominal pain, no headaches and no shortness of breath. Nothing aggravates the symptoms. Nothing relieves the symptoms. He has tried nothing for the symptoms. The treatment provided no relief.    Past Medical History  Diagnosis Date  . Arthritis, hip   . Pneumonia 2002, 2010  . Never smoked any substance    Past Surgical History  Procedure Laterality Date  . Tonsillectomy      As a child  . Appendectomy      At age 69   History reviewed. No pertinent family history. Social History  Substance Use Topics  . Smoking status: Never Smoker   . Smokeless tobacco: None  . Alcohol Use: No    Review of Systems  Respiratory: Negative for shortness of breath.   Cardiovascular: Positive for chest pain. Negative for palpitations and leg swelling.  Gastrointestinal: Negative for abdominal pain.  Musculoskeletal: Negative for back pain, arthralgias and gait problem.  Neurological: Negative for headaches.  All other systems reviewed and are negative.     Allergies  Ciprofloxacin and Keflex  Home Medications   Prior to Admission medications   Medication Sig Start Date End Date Taking? Authorizing Provider  [facility] EX) Apply 1 application topically at bedtime.   Yes Historical Provider, MD   BP 156/80 mmHg  Pulse 64  Temp(Src) 98.1 F (36.7 C) (Oral)  Resp 15  Ht 6\' 4"  (1.93 m)  SpO2 97% Physical Exam  Constitutional: He is oriented to person, place, and  time. He appears well-developed and well-nourished.  HENT:  Head: Normocephalic and atraumatic.  Neck: Normal range of motion.  Cardiovascular: Normal rate and regular rhythm.   Pulmonary/Chest: Effort normal. No respiratory distress. He exhibits tenderness.  Abdominal: Soft. He exhibits no distension. There is no tenderness.  Musculoskeletal: Normal range of motion. He exhibits no edema or tenderness.  Neurological: He is alert and oriented to person, place, and time. No cranial nerve deficit. Coordination normal.  Skin: Skin is warm and dry.  Psychiatric: He has a normal mood and affect. His behavior is normal.  Nursing note and vitals reviewed.   ED Course  Procedures (including critical care time) Labs Review Labs Reviewed  CBC WITH DIFFERENTIAL/PLATELET - Abnormal; Notable for the following:    Hemoglobin 12.9 (*)    All other components within normal limits  COMPREHENSIVE METABOLIC PANEL - Abnormal; Notable for the following:    Glucose, Bld 107 (*)    BUN 25 (*)    GFR calc non Af Amer 58 (*)    All other components within normal limits  URINALYSIS W MICROSCOPIC (NOT AT [facility]) - Abnormal; Notable for the following:    Specific Gravity, Urine >1.030 (*)    Hgb urine dipstick TRACE (*)    Bacteria, UA RARE (*)    Squamous Epithelial / LPF 0-5 (*)    All other components within normal limits    Imaging Review Dg Chest 2 View  11/01/2015  CLINICAL DATA:  Left-sided chest pain. Left clavicle pain.  Status post fall. EXAM: CHEST  2 VIEW COMPARISON:  08/12/2014 FINDINGS: The heart size and mediastinal contours are within normal limits. Both lungs are clear. The visualized skeletal structures are unremarkable. IMPRESSION: No active cardiopulmonary disease. Electronically Signed   By: Kathreen Devoid   On: 11/01/2015 19:14   Dg Clavicle Left  11/01/2015  CLINICAL DATA:  Left clavicle pain after recent fall. EXAM: LEFT CLAVICLE - 2+ VIEWS COMPARISON:  None. FINDINGS: No fracture or  suspicious focal osseous lesion is seen in the left clavicle. Mild osteoarthritis in the left acromioclavicular joint. IMPRESSION: No left clavicle fracture.  Mild left AC joint osteoarthritis. Electronically Signed   By: Ilona Sorrel M.D.   On: 11/01/2015 19:15   Ct Head Wo Contrast  11/01/2015  CLINICAL DATA:  Chest pain with deep inspiration since falling 5 days ago. Initial encounter. EXAM: CT HEAD WITHOUT CONTRAST TECHNIQUE: Contiguous axial images were obtained from the base of the skull through the vertex without intravenous contrast. COMPARISON:  Head CT 12/26/2014. FINDINGS: There is no evidence of acute intracranial hemorrhage, mass lesion, brain edema or extra-axial fluid collection. The ventricles and subarachnoid spaces are diffusely prominent but stable. There is no CT evidence of acute cortical infarction. There is stable mild chronic periventricular white matter disease. The visualized paranasal sinuses, mastoid air cells and middle ears are clear. The calvarium is intact. IMPRESSION: Stable head CT demonstrating mild generalized atrophy. No acute intracranial findings. Electronically Signed   By: Richardean Sale M.D.   On: 11/01/2015 20:47   I have personally reviewed and evaluated these images and lab results as part of my medical decision-making.   EKG Interpretation   Date/Time:  Friday November 01 2015 18:14:55 EST Ventricular Rate:  61 PR Interval:    QRS Duration: 130 QT Interval:  415 QTC Calculation: 418 R Axis:   -75 Text Interpretation:  IVCD, consider atypical RBBB Inferior infarct, age  indeterminate Lateral leads are also involved similar to previous  Confirmed by [facility] MD, Corene Cornea [phone]) on 11/01/2015 7:29:54 PM      MDM   Final diagnoses:  Fall, initial encounter   81 yo M w/ a fall yesterday and possibly one the day prior here with left sided rib pain, worse with palpation. No other complaints. Rest of exam as above.  Will eval for rib fx/PNA/ECG changes.  Suspect MSK.   Labs and imaging ok. Suspect fall likely 2/2 getting off balance from quick movements. Will dc with pcp follow up. Recommended use of walker.    Merrily Pew, MD 11/02/15 [phone]

## 2015-11-05 ENCOUNTER — Emergency Department (HOSPITAL_COMMUNITY)
Admission: EM | Admit: 2015-11-05 | Discharge: 2015-11-05 | Disposition: A | Discharge status: Home / Self Care | Payer: Medicare Other | Attending: Emergency Medicine | Admitting: Emergency Medicine

## 2015-11-05 ENCOUNTER — Encounter (HOSPITAL_COMMUNITY): Payer: Self-pay | Admitting: *Deleted

## 2015-11-05 DIAGNOSIS — Z8701 Personal history of pneumonia (recurrent): Secondary | ICD-10-CM | POA: Insufficient documentation

## 2015-11-05 DIAGNOSIS — M7989 Other specified soft tissue disorders: Secondary | ICD-10-CM | POA: Diagnosis present

## 2015-11-05 DIAGNOSIS — L03116 Cellulitis of left lower limb: Secondary | ICD-10-CM | POA: Insufficient documentation

## 2015-11-05 DIAGNOSIS — R600 Localized edema: Secondary | ICD-10-CM

## 2015-11-05 LAB — CBC WITH DIFFERENTIAL/PLATELET
BASOS PCT: 0 %
Basophils Absolute: 0 10*3/uL (ref 0.0–0.1)
Eosinophils Absolute: 0.2 10*3/uL (ref 0.0–0.7)
Eosinophils Relative: 3 %
HEMATOCRIT: 39.8 % (ref 39.0–52.0)
Hemoglobin: 12.7 g/dL — ABNORMAL LOW (ref 13.0–17.0)
LYMPHS ABS: 1 10*3/uL (ref 0.7–4.0)
LYMPHS PCT: 18 %
MCH: 27.6 pg (ref 26.0–34.0)
MCHC: 31.9 g/dL (ref 30.0–36.0)
MCV: 86.5 fL (ref 78.0–100.0)
MONO ABS: 0.8 10*3/uL (ref 0.1–1.0)
MONOS PCT: 13 %
NEUTROS ABS: 3.9 10*3/uL (ref 1.7–7.7)
Neutrophils Relative %: 66 %
Platelets: 182 10*3/uL (ref 150–400)
RBC: 4.6 MIL/uL (ref 4.22–5.81)
RDW: 14.2 % (ref 11.5–15.5)
WBC: 5.9 10*3/uL (ref 4.0–10.5)

## 2015-11-05 LAB — COMPREHENSIVE METABOLIC PANEL
ALK PHOS: 91 U/L (ref 38–126)
ALT: 17 U/L (ref 17–63)
AST: 21 U/L (ref 15–41)
Albumin: 3.8 g/dL (ref 3.5–5.0)
Anion gap: 6 (ref 5–15)
BILIRUBIN TOTAL: 0.5 mg/dL (ref 0.3–1.2)
BUN: 24 mg/dL — AB (ref 6–20)
CO2: 30 mmol/L (ref 22–32)
CREATININE: 1.1 mg/dL (ref 0.61–1.24)
Calcium: 8.9 mg/dL (ref 8.9–10.3)
Chloride: 103 mmol/L (ref 101–111)
GFR, EST NON AFRICAN AMERICAN: 59 mL/min — AB (ref >60)
GLUCOSE: 90 mg/dL (ref 65–99)
POTASSIUM: 4 mmol/L (ref 3.5–5.1)
Sodium: 139 mmol/L (ref 135–145)
TOTAL PROTEIN: 7.3 g/dL (ref 6.5–8.1)

## 2015-11-05 MED ORDER — VANCOMYCIN HCL IN DEXTROSE 1-5 GM/200ML-% IV SOLN
1000.0000 mg | Freq: Once | INTRAVENOUS | Status: AC
  Administered 2015-11-05: 1000 mg via INTRAVENOUS
  Filled 2015-11-05: qty 200

## 2015-11-05 MED ORDER — SULFAMETHOXAZOLE-TRIMETHOPRIM 800-160 MG PO TABS: 1.0000 | ORAL_TABLET | Freq: Two times a day (BID) | ORAL | Status: AC

## 2015-11-05 NOTE — ED Provider Notes (Signed)
CSN: [id number]     Arrival date & time 11/05/15  1626 History   First MD Initiated Contact with Patient 11/05/15 1635     Chief Complaint  Patient presents with  . Leg Swelling     (Consider location/radiation/quality/duration/timing/severity/associated sxs/prior Treatment) Patient is a 81 y.o. male presenting with rash. The history is provided by the patient (Patient has mild redness to his left leg.).  Rash Location: Left lower leg. Quality: painful   Pain details:    Quality:  Aching   Severity:  Mild   Onset quality:  Gradual   Timing:  Constant Associated symptoms: no abdominal pain, no diarrhea, no fatigue and no headaches     Past Medical History  Diagnosis Date  . Arthritis, hip   . Pneumonia 2002, 2010  . Never smoked any substance    Past Surgical History  Procedure Laterality Date  . Tonsillectomy      As a child  . Appendectomy      At age 29  . Hip surgery      left   No family history on file. Social History  Substance Use Topics  . Smoking status: Never Smoker   . Smokeless tobacco: None  . Alcohol Use: No    Review of Systems  Constitutional: Negative for appetite change and fatigue.  HENT: Negative for congestion, ear discharge and sinus pressure.   Eyes: Negative for discharge.  Respiratory: Negative for cough.   Cardiovascular: Negative for chest pain.  Gastrointestinal: Negative for abdominal pain and diarrhea.  Genitourinary: Negative for frequency and hematuria.  Musculoskeletal: Negative for back pain.  Skin: Positive for rash.  Neurological: Negative for seizures and headaches.  Psychiatric/Behavioral: Negative for hallucinations.      Allergies  Ciprofloxacin and Keflex  Home Medications   Prior to Admission medications   Medication Sig Start Date End Date Taking? Authorizing Provider  [facility] EX) Apply 1 application topically at bedtime.    Historical Provider, MD  sulfamethoxazole-trimethoprim (BACTRIM  DS,SEPTRA DS) 800-160 MG tablet Take 1 tablet by mouth 2 (two) times daily. 11/05/15 11/12/15  Milton Ferguson, MD   BP 154/67 mmHg  Pulse 60  Temp(Src) 97.7 F (36.5 C) (Oral)  Resp 16  Wt 164 lb (74.39 kg)  SpO2 99% Physical Exam  Constitutional: He is oriented to person, place, and time. He appears well-developed.  HENT:  Head: Normocephalic.  Eyes: Conjunctivae and EOM are normal. No scleral icterus.  Neck: Neck supple. No thyromegaly present.  Cardiovascular: Normal rate and regular rhythm.  Exam reveals no gallop and no friction rub.   No murmur heard. Pulmonary/Chest: No stridor. He has no wheezes. He has no rales. He exhibits no tenderness.  Abdominal: He exhibits no distension. There is no tenderness. There is no rebound.  Musculoskeletal: Normal range of motion. He exhibits edema.  Patient has edema to both lower legs. Left legs shows a rash going up to his knee.  Lymphadenopathy:    He has no cervical adenopathy.  Neurological: He is oriented to person, place, and time. He exhibits normal muscle tone. Coordination normal.  Skin: No rash noted. No erythema.  Psychiatric: He has a normal mood and affect. His behavior is normal.    ED Course  Procedures (including critical care time) Labs Review Labs Reviewed  CBC WITH DIFFERENTIAL/PLATELET - Abnormal; Notable for the following:    Hemoglobin 12.7 (*)    All other components within normal limits  COMPREHENSIVE METABOLIC PANEL - Abnormal; Notable for  the following:    BUN 24 (*)    GFR calc non Af Amer 59 (*)    All other components within normal limits    Imaging Review No results found. I have personally reviewed and evaluated these images and lab results as part of my medical decision-making.   EKG Interpretation None      MDM   Final diagnoses:  Cellulitis of left lower extremity    Rash left lower leg suspect cellulitis. Patient will be put on Bactrim as outpatient he is to come back tomorrow for  ultrasound to rule out DVT    Milton Ferguson, MD 11/05/15 1925

## 2015-11-05 NOTE — ED Notes (Signed)
Pt comes in with bilateral leg swelling and redness. Daughter states this is new. Also, pt was recently seen for a fall and has an area on his elbow the daughter would like looks at.

## 2015-11-05 NOTE — Discharge Instructions (Signed)
Follow up to get the ultrasound of your leg tomorrow.  Follow up with your md this week for recheck

## 2015-11-06 ENCOUNTER — Encounter (HOSPITAL_COMMUNITY): Payer: Self-pay | Admitting: Emergency Medicine

## 2015-11-06 ENCOUNTER — Ambulatory Visit (HOSPITAL_COMMUNITY)
Admit: 2015-11-06 | Discharge: 2015-11-06 | Disposition: A | Discharge status: Home / Self Care | Payer: Medicare Other | Attending: Emergency Medicine | Admitting: Emergency Medicine

## 2015-11-06 ENCOUNTER — Emergency Department (HOSPITAL_COMMUNITY)
Admission: EM | Admit: 2015-11-06 | Discharge: 2015-11-06 | Disposition: A | Discharge status: Home / Self Care | Payer: Medicare Other | Attending: Emergency Medicine | Admitting: Emergency Medicine

## 2015-11-06 DIAGNOSIS — Z792 Long term (current) use of antibiotics: Secondary | ICD-10-CM | POA: Diagnosis not present

## 2015-11-06 DIAGNOSIS — I82402 Acute embolism and thrombosis of unspecified deep veins of left lower extremity: Secondary | ICD-10-CM | POA: Diagnosis not present

## 2015-11-06 DIAGNOSIS — Z8739 Personal history of other diseases of the musculoskeletal system and connective tissue: Secondary | ICD-10-CM | POA: Insufficient documentation

## 2015-11-06 DIAGNOSIS — F0390 Unspecified dementia without behavioral disturbance: Secondary | ICD-10-CM | POA: Diagnosis not present

## 2015-11-06 DIAGNOSIS — I82412 Acute embolism and thrombosis of left femoral vein: Secondary | ICD-10-CM | POA: Diagnosis not present

## 2015-11-06 DIAGNOSIS — Y9289 Other specified places as the place of occurrence of the external cause: Secondary | ICD-10-CM | POA: Diagnosis not present

## 2015-11-06 DIAGNOSIS — I82502 Chronic embolism and thrombosis of unspecified deep veins of left lower extremity: Secondary | ICD-10-CM | POA: Diagnosis not present

## 2015-11-06 DIAGNOSIS — Y998 Other external cause status: Secondary | ICD-10-CM | POA: Diagnosis not present

## 2015-11-06 DIAGNOSIS — R600 Localized edema: Secondary | ICD-10-CM

## 2015-11-06 DIAGNOSIS — Y9389 Activity, other specified: Secondary | ICD-10-CM | POA: Diagnosis not present

## 2015-11-06 DIAGNOSIS — Z8701 Personal history of pneumonia (recurrent): Secondary | ICD-10-CM | POA: Diagnosis not present

## 2015-11-06 DIAGNOSIS — W1839XA Other fall on same level, initial encounter: Secondary | ICD-10-CM | POA: Diagnosis not present

## 2015-11-06 DIAGNOSIS — Z043 Encounter for examination and observation following other accident: Secondary | ICD-10-CM | POA: Diagnosis present

## 2015-11-06 HISTORY — DX: Unspecified dementia, mild, without behavioral disturbance, psychotic disturbance, mood disturbance, and anxiety: F03.A0

## 2015-11-06 HISTORY — DX: Unspecified dementia without behavioral disturbance: F03.90

## 2015-11-06 MED ORDER — APIXABAN 2.5 MG PO TABS: 2.5000 mg | ORAL_TABLET | Freq: Two times a day (BID) | ORAL | Status: DC

## 2015-11-06 NOTE — Discharge Instructions (Signed)
Deep Vein Thrombosis °A deep vein thrombosis (DVT) is a blood clot (thrombus) that usually occurs in a deep, larger vein of the lower leg or the pelvis, or in an upper extremity such as the arm. These are dangerous and can lead to serious and even life-threatening complications if the clot travels to the lungs. °A DVT can damage the valves in your leg veins so that instead of flowing upward, the blood pools in the lower leg. This is called post-thrombotic syndrome, and it can result in pain, swelling, discoloration, and sores on the leg. °CAUSES °A DVT is caused by the formation of a blood clot in your leg, pelvis, or arm. Usually, several things contribute to the formation of blood clots. A clot may develop when: °· Your blood flow slows down. °· Your vein becomes damaged in some way. °· You have a condition that makes your blood clot more easily. °RISK FACTORS °A DVT is more likely to develop in: °· People who are older, especially over 60 years of age. °· People who are overweight (obese). °· People who sit or lie still for a long time, such as during long-distance travel (over 4 hours), bed rest, hospitalization, or during recovery from certain medical conditions like a stroke. °· People who do not engage in much physical activity (sedentary lifestyle). °· People who have chronic breathing disorders. °· People who have a personal or family history of blood clots or blood clotting disease. °· People who have peripheral vascular disease (PVD), diabetes, or some types of cancer. °· People who have heart disease, especially if the person had a recent heart attack or has congestive heart failure. °· People who have neurological diseases that affect the legs (leg paresis). °· People who have had a traumatic injury, such as breaking a hip or leg. °· People who have recently had major or lengthy surgery, especially on the hip, knee, or abdomen. °· People who have had a central line placed inside a large vein. °· People  who take medicines that contain the hormone estrogen. These include birth control pills and hormone replacement therapy. °· Pregnancy or during childbirth or the postpartum period. °· Long plane flights (over 8 hours). °SIGNS AND SYMPTOMS °Symptoms of a DVT can include:  °· Swelling of your leg or arm, especially if one side is much worse. °· Warmth and redness of your leg or arm, especially if one side is much worse. °· Pain in your arm or leg. If the clot is in your leg, symptoms may be more noticeable or worse when you stand or walk. °· A feeling of pins and needles, if the clot is in the arm. °The symptoms of a DVT that has traveled to the lungs (pulmonary embolism, PE) usually start suddenly and include: °· Shortness of breath while active or at rest. °· Coughing or coughing up blood or blood-tinged mucus. °· Chest pain that is often worse with deep breaths. °· Rapid or irregular heartbeat. °· Feeling light-headed or dizzy. °· Fainting. °· Feeling anxious. °· Sweating. °There may also be pain and swelling in a leg if that is where the blood clot started. °These symptoms may represent a serious problem that is an emergency. Do not wait to see if the symptoms will go away. Get medical help right away. Call your local emergency services (911 in the [place] Do not drive yourself to the hospital. °DIAGNOSIS °Your health care provider will take a medical history and perform a physical exam. You may also   have other tests, including: °· Blood tests to assess the clotting properties of your blood. °· Imaging tests, such as CT, ultrasound, MRI, X-ray, and other tests to see if you have clots anywhere in your body. °TREATMENT °After a DVT is identified, it can be treated. The type of treatment that you receive depends on many factors, such as the cause of your DVT, your risk for bleeding or developing more clots, and other medical conditions that you have. Sometimes, a combination of treatments is necessary. Treatment  options may be combined and include: °· Monitoring the blood clot with ultrasound. °· Taking medicines by mouth, such as newer blood thinners (anticoagulants), thrombolytics, or warfarin. °· Taking anticoagulant medicine by injection or through an IV tube. °· Wearing compression stockings or using different types of devices. °· Surgery (rare) to remove the blood clot or to place a filter in your abdomen to stop the blood clot from traveling to your lungs. °Treatments for a DVT are often divided into immediate treatment and long-term treatment (up to 3 months after DVT). You can work with your health care provider to choose the treatment program that is best for you. °HOME CARE INSTRUCTIONS °If you are taking a newer oral anticoagulant: °· Take the medicine every single day at the same time each day. °· Understand what foods and drugs interact with this medicine. °· Understand that there are no regular blood tests required when using this medicine. °· Understand the side effects of this medicine, including excessive bruising or bleeding. Ask your health care provider or pharmacist about other possible side effects. °If you are taking warfarin: °· Understand how to take warfarin and know which foods can affect how warfarin works in your body. °· Understand that it is dangerous to take too much or too little warfarin. Too much warfarin increases the risk of bleeding. Too little warfarin continues to allow the risk for blood clots. °· Follow your PT and INR blood testing schedule. The PT and INR results allow your health care provider to adjust your dose of warfarin. It is very important that you have your PT and INR tested as often as told by your health care provider. °· Avoid major changes in your diet, or tell your health care provider before you change your diet. Arrange a visit with a registered dietitian to answer your questions. Many foods, especially foods that are high in vitamin K, can interfere with warfarin  and affect the PT and INR results. Eat a consistent amount of foods that are high in vitamin K, such as: °¨ Spinach, kale, broccoli, cabbage, collard greens, turnip greens, Brussels sprouts, peas, cauliflower, seaweed, and parsley. °¨ Beef liver and pork liver. °¨ Green tea. °¨ Soybean oil. °· Tell your health care provider about any and all medicines, vitamins, and supplements that you take, including aspirin and other over-the-counter anti-inflammatory medicines. Be especially cautious with aspirin and anti-inflammatory medicines. Do not take those before you ask your health care provider if it is safe to do so. This is important because many medicines can interfere with warfarin and affect the PT and INR results. °· Do not start or stop taking any over-the-counter or prescription medicine unless your health care provider or pharmacist tells you to do so. °If you take warfarin, you will also need to do these things: °· Hold pressure over cuts for longer than usual. °· Tell your dentist and other health care providers that you are taking warfarin before you have any procedures in which   bleeding may occur. °· Avoid alcohol or drink very small amounts. Tell your health care provider if you change your alcohol intake. °· Do not use tobacco products, including cigarettes, chewing tobacco, and e-cigarettes. If you need help quitting, ask your health care provider. °· Avoid contact sports. °General Instructions °· Take over-the-counter and prescription medicines only as told by your health care provider. Anticoagulant medicines can have side effects, including easy bruising and difficulty stopping bleeding. If you are prescribed an anticoagulant, you will also need to do these things: °¨ Hold pressure over cuts for longer than usual. °¨ Tell your dentist and other health care providers that you are taking anticoagulants before you have any procedures in which bleeding may occur. °¨ Avoid contact sports. °· Wear a medical  alert bracelet or carry a medical alert card that says you have had a PE. °· Ask your health care provider how soon you can go back to your normal activities. Stay active to prevent new blood clots from forming. °· Make sure to exercise while traveling or when you have been sitting or standing for a long period of time. It is very important to exercise. Exercise your legs by walking or by tightening and relaxing your leg muscles often. Take frequent walks. °· Wear compression stockings as told by your health care provider to help prevent more blood clots from forming. °· Do not use tobacco products, including cigarettes, chewing tobacco, and e-cigarettes. If you need help quitting, ask your health care provider. °· Keep all follow-up appointments with your health care provider. This is important. °PREVENTION °Take these actions to decrease your risk of developing another DVT: °· Exercise regularly. For at least 30 minutes every day, engage in: °¨ Activity that involves moving your arms and legs. °¨ Activity that encourages good blood flow through your body by increasing your heart rate. °· Exercise your arms and legs every hour during long-distance travel (over 4 hours). Drink plenty of water and avoid drinking alcohol while traveling. °· Avoid sitting or lying in bed for long periods of time without moving your legs. °· Maintain a weight that is appropriate for your height. Ask your health care provider what weight is healthy for you. °· If you are a woman who is over 35 years of age, avoid unnecessary use of medicines that contain estrogen. These include birth control pills. °· Do not smoke, especially if you take estrogen medicines. If you need help quitting, ask your health care provider. °If you are hospitalized, prevention measures may include: °· Early walking after surgery, as soon as your health care provider says that it is safe. °· Receiving anticoagulants to prevent blood clots. If you cannot take  anticoagulants, other options may be available, such as wearing compression stockings or using different types of devices. °SEEK IMMEDIATE MEDICAL CARE IF: °· You have new or increased pain, swelling, or redness in an arm or leg. °· You have numbness or tingling in an arm or leg. °· You have shortness of breath while active or at rest. °· You have chest pain. °· You have a rapid or irregular heartbeat. °· You feel light-headed or dizzy. °· You cough up blood. °· You notice blood in your vomit, bowel movement, or urine. °These symptoms may represent a serious problem that is an emergency. Do not wait to see if the symptoms will go away. Get medical help right away. Call your local emergency services (911 in the [place] Do not drive yourself to the hospital. °  °  This information is not intended to replace advice given to you by your health care provider. Make sure you discuss any questions you have with your health care provider. °  °Document Released: 11/16/2005 Document Revised: 08/07/2015 Document Reviewed: 03/13/2015 °[employer] Interactive Patient Education ©2016 [employer] ° °

## 2015-11-06 NOTE — ED Provider Notes (Signed)
CSN: [id number]     Arrival date & time 11/06/15  1239 History   First MD Initiated Contact with Patient 11/06/15 1321     Chief Complaint  Patient presents with  . Fall    patient seen for      Level V caveat due to dementia. Patient is a 81 y.o. male presenting with fall. The history is provided by the patient.  Fall   patient was seen yesterday for possible cellulitis of his lower leg. Has a history of moderate dementia. Also had a fall a couple days ago. Reportedly has not had frequent falls but was told that he should use a walker instead of a cane and family states he is unlikely to do this. Ultrasound Doppler of his lower extremity was done and showed extensive clot but only obstructing in the calf but did have clot up to the femoral vein. No chest pain. No previous clots. He is not a smoker. No fevers. No trouble breathing.   Past Medical History  Diagnosis Date  . Arthritis, hip   . Pneumonia 2002, 2010  . Never smoked any substance   . Mild dementia    Past Surgical History  Procedure Laterality Date  . Tonsillectomy      As a child  . Appendectomy      At age 71  . Hip surgery      left   No family history on file. Social History  Substance Use Topics  . Smoking status: Never Smoker   . Smokeless tobacco: Not on file  . Alcohol Use: No    Review of Systems  Unable to perform ROS: Dementia      Allergies  Ciprofloxacin and Keflex  Home Medications   Prior to Admission medications   Medication Sig Start Date End Date Taking? Authorizing Provider  [facility] EX) Apply 1 application topically at bedtime.   Yes Historical Provider, MD  sulfamethoxazole-trimethoprim (BACTRIM DS,SEPTRA DS) 800-160 MG tablet Take 1 tablet by mouth 2 (two) times daily. 11/05/15 11/12/15 Yes Milton Ferguson, MD  apixaban (ELIQUIS) 2.5 MG TABS tablet Take 1 tablet (2.5 mg total) by mouth 2 (two) times daily. 11/06/15   Davonna Belling, MD   BP 127/67 mmHg  Temp(Src)  97.5 F (36.4 C) (Axillary)  Resp 18  SpO2 98% Physical Exam  Constitutional: He appears well-developed.  HENT:  Head: Atraumatic.  Neck: Neck supple.  Cardiovascular: Normal rate.   Pulmonary/Chest: Effort normal.  Abdominal: Soft.  Musculoskeletal: He exhibits edema.  Edema bilateral lower extremities. Erythema and rash on left lower leg with some fashion more proximally. Chronic venous changes. Somewhat equal edema bilaterally.  Neurological:  Patient is awake and pleasant, but demented.  Skin: Skin is warm.    ED Course  Procedures (including critical care time) Labs Review Labs Reviewed - No data to display  Imaging Review US Venous Img Lower Unilateral Left  11/06/2015  CLINICAL DATA:  Chronic lower extremity edema. EXAM: LEFT LOWER EXTREMITY VENOUS DOPPLER ULTRASOUND TECHNIQUE: Gray-scale sonography with graded compression, as well as color Doppler and duplex ultrasound were performed to evaluate the lower extremity deep venous systems from the level of the common femoral vein and including the common femoral, femoral, profunda femoral, popliteal and calf veins including the posterior tibial, peroneal and gastrocnemius veins when visible. The superficial great saphenous vein was also interrogated. Spectral Doppler was utilized to evaluate flow at rest and with distal augmentation maneuvers in the common femoral, femoral and popliteal veins.  COMPARISON:  None. FINDINGS: Contralateral Common Femoral Vein: Respiratory phasicity is normal and symmetric with the symptomatic side. No evidence of thrombus. Normal compressibility. Common Femoral Vein: No evidence of thrombus. Normal compressibility, respiratory phasicity and response to augmentation. Saphenofemoral Junction: No evidence of thrombus. Normal compressibility and flow on color Doppler imaging. Profunda Femoral Vein: No evidence of thrombus. Normal compressibility and flow on color Doppler imaging. Femoral Vein: There is  nonocclusive clot in the femoral vein from the proximal thigh extending into the popliteal vein. Popliteal Vein: Occlusive clot noted within the popliteal vein. Calf Veins: Nonocclusive clot extending from the popliteal vein into the calf veins. Superficial Great Saphenous Vein: No evidence of thrombus. Normal compressibility and flow on color Doppler imaging. Venous Reflux:  None. Other Findings:  None. IMPRESSION: Deep venous thrombosis within the left femoral vein, popliteal vein and calf veins. This is nonocclusive in the femoral vein and calf veins and occlusive in the popliteal vein. Electronically Signed   By: Rolm Baptise M.D.   On: 11/06/2015 12:14   I have personally reviewed and evaluated these images and lab results as part of my medical decision-making.   EKG Interpretation None      MDM   Final diagnoses:  DVT (deep venous thrombosis), left    Patient with DVT of left lower extremity. No signs of pulmonary embolism. Has dementia and is somewhat unsteady but I have discussed with the primary care doctor, Dr.Scott Luking, and he recommends starting Eliquis at 2.5 mg twice a day. He will see the patient follow-up in 2 days at [place], MD 11/06/15 1420

## 2015-11-06 NOTE — ED Notes (Signed)
Patient seen in ER yesterday for fall U/S today for DVT

## 2015-11-08 ENCOUNTER — Encounter: Payer: Self-pay | Admitting: Family Medicine

## 2015-11-08 ENCOUNTER — Ambulatory Visit (INDEPENDENT_AMBULATORY_CARE_PROVIDER_SITE_OTHER): Payer: Medicare Other | Admitting: Family Medicine

## 2015-11-08 VITALS — BP 118/66 | Ht 72.0 in | Wt 165.0 lb

## 2015-11-08 DIAGNOSIS — I82432 Acute embolism and thrombosis of left popliteal vein: Secondary | ICD-10-CM

## 2015-11-08 NOTE — Progress Notes (Signed)
   Subjective:    Patient ID: [name], male    DOB: 11/24/1927, 81 y.o.   MRN: [id number]  HPIfollow up DVT. Taking eliquis 2.5mg  one bid. Treated for cellulitis then had a ultrasound completed which showed a DVT started him on low-dose Arby Barrette because of his high risk of falling  Daughter has concerns about memory issues.  On discussion with the patient there is some obvious memory issues patient is aware of who I am and where he is but repeats himself. Probable early onset dementia.   Review of Systems Records from the hospital was reviewed. Patient denies fever chills vomiting.    Objective:   Physical Exam Lungs are clear hearts regular swelling in the left leg is noted and in the right leg cellulitis does not appear to be prominent.       Assessment & Plan:  DVT using low-dose Arby Barrette because of increased risk of falls. This patient had a high risk of falls but not treating DVT could be problematic. The plan would be low-dose medication. Follow-up later this month. Follow-up ultrasound of the leg.  Moderate early onset dementia yeah. Discussion of medication on next visit

## 2015-11-28 ENCOUNTER — Encounter: Payer: Self-pay | Admitting: Family Medicine

## 2015-11-28 ENCOUNTER — Ambulatory Visit (INDEPENDENT_AMBULATORY_CARE_PROVIDER_SITE_OTHER): Payer: Medicare Other | Admitting: Family Medicine

## 2015-11-28 VITALS — BP 138/86 | Ht 72.0 in | Wt 169.0 lb

## 2015-11-28 DIAGNOSIS — I82402 Acute embolism and thrombosis of unspecified deep veins of left lower extremity: Secondary | ICD-10-CM | POA: Insufficient documentation

## 2015-11-28 DIAGNOSIS — G309 Alzheimer's disease, unspecified: Secondary | ICD-10-CM

## 2015-11-28 DIAGNOSIS — G4700 Insomnia, unspecified: Secondary | ICD-10-CM | POA: Diagnosis not present

## 2015-11-28 DIAGNOSIS — F0280 Dementia in other diseases classified elsewhere without behavioral disturbance: Secondary | ICD-10-CM | POA: Diagnosis not present

## 2015-11-28 DIAGNOSIS — G301 Alzheimer's disease with late onset: Secondary | ICD-10-CM

## 2015-11-28 DIAGNOSIS — I82432 Acute embolism and thrombosis of left popliteal vein: Secondary | ICD-10-CM | POA: Diagnosis not present

## 2015-11-28 MED ORDER — DONEPEZIL HCL 5 MG PO TABS: 5.0000 mg | ORAL_TABLET | Freq: Every day | ORAL | Status: DC

## 2015-11-28 NOTE — Progress Notes (Signed)
   Subjective:    Patient ID: [name], male    DOB: 03-Dec-1926, 81 y.o.   MRN: [id number]  HPI Patient arrives with daughter to discuss difficulty sleeping.  patient not sleeping well but this is mainly because his wife is somewhat combative in up all night long not letting him sleep Patient having a fair amount of forgetfulness worse over the past 6 months mainly short-term memory loss and dysfunction Also has DVT he is on a blood thinner no complications in this Also has chronic pedal edema this is been doing better because including keeping his legs elevated  No recent infection of the legs no fevers Eating overall okay. Behavior good.  Review of Systems  see above    Objective:   Physical Exam  lungs clear heart regular patient able to state his name able to state date of birth.    patient able to recall items 3 for 3 immediately after being spoken but after distraction unable to recall any. Also fails clock drawing.  25 minutes was spent with the patient. Greater than half the time was spent in discussion and answering questions and counseling regarding the issues that the patient came in for today.      Assessment & Plan:   probable early Alzheimer's-Aricept 5 mg follow-up within 2 months if nausea poor eating or other problems stop medicine    difficulty sleeping mainly due to his wife not sleeping well I believe the best approach is getting his wife sleeping better    DVT continue anticoagulant. Repeat ultrasound in approximately 2-1/2 months. Follow-up in 2 months.

## 2015-12-24 ENCOUNTER — Other Ambulatory Visit: Payer: Self-pay

## 2015-12-24 MED ORDER — APIXABAN 2.5 MG PO TABS: 2.5000 mg | ORAL_TABLET | Freq: Two times a day (BID) | ORAL | Status: DC

## 2015-12-24 NOTE — Telephone Encounter (Signed)
Was prescribed by another doctor. Do you wish to refill?

## 2016-01-20 ENCOUNTER — Telehealth: Payer: Self-pay | Admitting: Family Medicine

## 2016-01-20 NOTE — Telephone Encounter (Signed)
pts daughter is calling to see if he needs to put on his TED hose, he is having  Swelling in both legs and painful.   Please advise, he has an appt with you on the 27th

## 2016-01-20 NOTE — Telephone Encounter (Signed)
Consult with dr Nicki Reaper. Can wear ted hose. Follow up if worse. Keep appt.

## 2016-01-20 NOTE — Telephone Encounter (Signed)
Taking eliquis

## 2016-01-20 NOTE — Telephone Encounter (Signed)
Pain and swelling in both legs from knees down. Same in both legs. Started 2 days ago.

## 2016-01-27 ENCOUNTER — Ambulatory Visit: Payer: Medicare Other | Admitting: Family Medicine

## 2016-02-12 ENCOUNTER — Telehealth: Payer: Self-pay | Admitting: Family Medicine

## 2016-02-12 NOTE — Telephone Encounter (Signed)
Daughter calling to say that pt is wanting to come in to see you but won't  Tell Collette why, she believes it is due to his wanting his drivers license She did not take him to renew them bc she feels he is unsafe to drive.   She would like to have you explain why not if this is what the appt is about She don't want him to know she has told you this. He can't even remember How to get to his bathroom in the house let alone get behind a wheel   Please advise when you would like to see him. No emergency

## 2016-02-12 NOTE — Telephone Encounter (Signed)
I would recommend an office visit in approximately 2-3 weeks unless there is an emergent situation. With the current flu going around it would not be wise for him to come to the office currently

## 2016-02-12 NOTE — Telephone Encounter (Signed)
Pts daughter is aware of your recommendation and will call back a later  Point to make the appt

## 2016-02-24 ENCOUNTER — Encounter (HOSPITAL_COMMUNITY): Payer: Self-pay

## 2016-02-24 ENCOUNTER — Emergency Department (HOSPITAL_COMMUNITY): Payer: Medicare Other

## 2016-02-24 ENCOUNTER — Inpatient Hospital Stay (HOSPITAL_COMMUNITY)
Admission: EM | Admit: 2016-02-24 | Discharge: 2016-03-02 | DRG: 641 | Disposition: A | Discharge status: Home Health | Payer: Medicare Other | Attending: Internal Medicine | Admitting: Internal Medicine

## 2016-02-24 DIAGNOSIS — F209 Schizophrenia, unspecified: Secondary | ICD-10-CM | POA: Diagnosis present

## 2016-02-24 DIAGNOSIS — M6281 Muscle weakness (generalized): Secondary | ICD-10-CM | POA: Diagnosis not present

## 2016-02-24 DIAGNOSIS — Z7901 Long term (current) use of anticoagulants: Secondary | ICD-10-CM

## 2016-02-24 DIAGNOSIS — R531 Weakness: Secondary | ICD-10-CM | POA: Diagnosis not present

## 2016-02-24 DIAGNOSIS — G309 Alzheimer's disease, unspecified: Secondary | ICD-10-CM | POA: Diagnosis present

## 2016-02-24 DIAGNOSIS — F0280 Dementia in other diseases classified elsewhere without behavioral disturbance: Secondary | ICD-10-CM | POA: Diagnosis present

## 2016-02-24 DIAGNOSIS — E860 Dehydration: Secondary | ICD-10-CM | POA: Diagnosis not present

## 2016-02-24 DIAGNOSIS — E871 Hypo-osmolality and hyponatremia: Secondary | ICD-10-CM | POA: Diagnosis not present

## 2016-02-24 DIAGNOSIS — R29898 Other symptoms and signs involving the musculoskeletal system: Secondary | ICD-10-CM

## 2016-02-24 DIAGNOSIS — N390 Urinary tract infection, site not specified: Secondary | ICD-10-CM | POA: Diagnosis present

## 2016-02-24 DIAGNOSIS — R918 Other nonspecific abnormal finding of lung field: Secondary | ICD-10-CM | POA: Diagnosis present

## 2016-02-24 DIAGNOSIS — Z7401 Bed confinement status: Secondary | ICD-10-CM

## 2016-02-24 DIAGNOSIS — R8290 Unspecified abnormal findings in urine: Secondary | ICD-10-CM | POA: Diagnosis present

## 2016-02-24 DIAGNOSIS — R4182 Altered mental status, unspecified: Secondary | ICD-10-CM

## 2016-02-24 DIAGNOSIS — R911 Solitary pulmonary nodule: Secondary | ICD-10-CM | POA: Diagnosis not present

## 2016-02-24 DIAGNOSIS — G629 Polyneuropathy, unspecified: Secondary | ICD-10-CM | POA: Diagnosis not present

## 2016-02-24 DIAGNOSIS — Z8701 Personal history of pneumonia (recurrent): Secondary | ICD-10-CM

## 2016-02-24 DIAGNOSIS — I872 Venous insufficiency (chronic) (peripheral): Secondary | ICD-10-CM | POA: Diagnosis not present

## 2016-02-24 DIAGNOSIS — Z66 Do not resuscitate: Secondary | ICD-10-CM | POA: Diagnosis present

## 2016-02-24 DIAGNOSIS — R404 Transient alteration of awareness: Secondary | ICD-10-CM | POA: Diagnosis not present

## 2016-02-24 DIAGNOSIS — Z86718 Personal history of other venous thrombosis and embolism: Secondary | ICD-10-CM

## 2016-02-24 DIAGNOSIS — Z515 Encounter for palliative care: Secondary | ICD-10-CM | POA: Insufficient documentation

## 2016-02-24 DIAGNOSIS — F319 Bipolar disorder, unspecified: Secondary | ICD-10-CM | POA: Diagnosis present

## 2016-02-24 HISTORY — DX: Need for assistance with personal care: Z74.1

## 2016-02-24 LAB — CBC WITH DIFFERENTIAL/PLATELET
BASOS ABS: 0 10*3/uL (ref 0.0–0.1)
BASOS PCT: 0 %
EOS ABS: 0.1 10*3/uL (ref 0.0–0.7)
Eosinophils Relative: 1 %
HCT: 40.5 % (ref 39.0–52.0)
HEMOGLOBIN: 13.5 g/dL (ref 13.0–17.0)
Lymphocytes Relative: 10 %
Lymphs Abs: 0.6 10*3/uL — ABNORMAL LOW (ref 0.7–4.0)
MCH: 27.6 pg (ref 26.0–34.0)
MCHC: 33.3 g/dL (ref 30.0–36.0)
MCV: 82.8 fL (ref 78.0–100.0)
Monocytes Absolute: 1.3 10*3/uL — ABNORMAL HIGH (ref 0.1–1.0)
Monocytes Relative: 20 %
NEUTROS ABS: 4.4 10*3/uL (ref 1.7–7.7)
NEUTROS PCT: 69 %
Platelets: 193 10*3/uL (ref 150–400)
RBC: 4.89 MIL/uL (ref 4.22–5.81)
RDW: 14.1 % (ref 11.5–15.5)
WBC: 6.3 10*3/uL (ref 4.0–10.5)

## 2016-02-24 LAB — TROPONIN I: Troponin I: <0.03 ng/mL (ref <0.031)

## 2016-02-24 LAB — LACTIC ACID, PLASMA
Lactic Acid, Venous: 2.3 mmol/L (ref 0.5–2.0)
Lactic Acid, Venous: 0.9 mmol/L (ref 0.5–2.0)

## 2016-02-24 LAB — URINALYSIS, ROUTINE W REFLEX MICROSCOPIC
Bilirubin Urine: NEGATIVE
GLUCOSE, UA: NEGATIVE mg/dL
Ketones, ur: NEGATIVE mg/dL
Leukocytes, UA: NEGATIVE
Nitrite: NEGATIVE
PROTEIN: NEGATIVE mg/dL
Specific Gravity, Urine: 1.005 (ref 1.005–1.030)
pH: 5 (ref 5.0–8.0)

## 2016-02-24 LAB — URINE MICROSCOPIC-ADD ON

## 2016-02-24 LAB — COMPREHENSIVE METABOLIC PANEL
ALBUMIN: 3.2 g/dL — AB (ref 3.5–5.0)
ALT: 27 U/L (ref 17–63)
ANION GAP: 9 (ref 5–15)
AST: 35 U/L (ref 15–41)
Alkaline Phosphatase: 79 U/L (ref 38–126)
BILIRUBIN TOTAL: 1 mg/dL (ref 0.3–1.2)
BUN: 26 mg/dL — ABNORMAL HIGH (ref 6–20)
CO2: 26 mmol/L (ref 22–32)
Calcium: 8.5 mg/dL — ABNORMAL LOW (ref 8.9–10.3)
Chloride: 97 mmol/L — ABNORMAL LOW (ref 101–111)
Creatinine, Ser: 1 mg/dL (ref 0.61–1.24)
GFR calc Af Amer: >60 mL/min (ref >60)
GFR calc non Af Amer: >60 mL/min (ref >60)
GLUCOSE: 114 mg/dL — AB (ref 65–99)
POTASSIUM: 4 mmol/L (ref 3.5–5.1)
SODIUM: 132 mmol/L — AB (ref 135–145)
Total Protein: 6.7 g/dL (ref 6.5–8.1)

## 2016-02-24 MED ORDER — SODIUM CHLORIDE 0.9 % IV BOLUS (SEPSIS)
500.0000 mL | Freq: Once | INTRAVENOUS | Status: AC
  Administered 2016-02-24: 20:00:00 via INTRAVENOUS

## 2016-02-24 MED ORDER — SODIUM CHLORIDE 0.9 % IV SOLN
INTRAVENOUS | Status: DC
  Administered 2016-02-25 – 2016-03-01 (×6): via INTRAVENOUS

## 2016-02-24 MED ORDER — SODIUM CHLORIDE 0.9 % IV BOLUS (SEPSIS)
500.0000 mL | Freq: Once | INTRAVENOUS | Status: AC
Start: 2016-02-24 — End: 2016-02-24
  Administered 2016-02-24: 500 mL via INTRAVENOUS

## 2016-02-24 MED ORDER — DEXTROSE 5 % IV SOLN: 1.0000 g | INTRAVENOUS | Status: DC

## 2016-02-24 NOTE — ED Notes (Signed)
Patient was to weak to ambulate could not stand on his own. Made Dr. Thurnell Garbe aware

## 2016-02-24 NOTE — ED Provider Notes (Signed)
CSN: [id number]     Arrival date & time 02/24/16  1508 History   First MD Initiated Contact with Patient 02/24/16 1536     Chief Complaint  Patient presents with  . Weakness      Patient is a 82 y.o. male presenting with weakness. The history is provided by a relative, the patient and the EMS personnel. The history is limited by the condition of the patient (Hx dementia).  Weakness  Pt was seen at 1545. Per EMS, pt's family and pt: Pt's family states pt was "too weak to get up and stand or walk" this morning after awakening. Pt's family states pt was "lethargic" and "more confused." Family believes pt "has a urine infection." Pt himself only c/o feeling "weak." Denies fevers, no focal motor weakness, no syncope, no fall, no CP/SOB, no abd pain, no vomiting/diarrhea.    Past Medical History  Diagnosis Date  . Arthritis, hip   . Pneumonia 2002, 2010  . Never smoked any substance   . Mild dementia   . Assistance needed for mobility     walks with walker   Past Surgical History  Procedure Laterality Date  . Tonsillectomy      As a child  . Appendectomy      At age 77  . Hip surgery      left    Social History  Substance Use Topics  . Smoking status: Never Smoker   . Smokeless tobacco: None  . Alcohol Use: No    Review of Systems  Unable to perform ROS: Dementia  Neurological: Positive for weakness.      Allergies  Ciprofloxacin and Keflex  Home Medications   Prior to Admission medications   Medication Sig Start Date End Date Taking? Authorizing Provider  apixaban (ELIQUIS) 2.5 MG TABS tablet Take 1 tablet (2.5 mg total) by mouth 2 (two) times daily. 12/24/15   Kathyrn Drown, MD  donepezil (ARICEPT) 5 MG tablet Take 1 tablet (5 mg total) by mouth at bedtime. 11/28/15   Kathyrn Drown, MD  [facility] EX) Apply 1 application topically at bedtime.    Historical Provider, MD   BP 114/79 mmHg  Pulse 70  Temp(Src) 98 F (36.7 C) (Oral)  Resp 20  SpO2  100%   19:40 Orthostatic Vital Signs JW  Orthostatic Lying  - BP- Lying: 140/107 mmHg ; Pulse- Lying: 76  Orthostatic Sitting - BP- Sitting: 129/109 mmHg ; Pulse- Sitting: 82  Orthostatic Standing at 0 minutes - BP- Standing at 0 minutes: 139/123 mmHg ; Pulse- Standing at 0 minutes: 80      Physical Exam  1550: Physical examination:  Nursing notes reviewed; Vital signs and O2 SAT reviewed;  Constitutional: Well developed, Well nourished, In no acute distress; Head:  Normocephalic, atraumatic; Eyes: EOMI, PERRL, No scleral icterus; ENMT: Mouth and pharynx normal, Mucous membranes dry; Neck: Supple, Full range of motion, No lymphadenopathy; Cardiovascular: Regular rate and rhythm, No gallop; Respiratory: Breath sounds clear & equal bilaterally, No wheezes.  Speaking full sentences with ease, Normal respiratory effort/excursion; Chest: Nontender, Movement normal; Abdomen: Soft, Nontender, Nondistended, Normal bowel sounds; Genitourinary: No CVA tenderness; Extremities: Pulses normal, No tenderness, No edema, No calf edema or asymmetry.; Neuro: Awake, alert, confused per hx dementia.  Speech clear. Moves all extremities spontaneously.; Skin: Color normal, Warm, Dry.   ED Course  Procedures (including critical care time) Labs Review  Imaging Review I have personally reviewed and evaluated these images and lab results as  part of my medical decision-making.   EKG Interpretation   Date/Time:  Monday February 24 2016 15:32:38 EDT Ventricular Rate:  68 PR Interval:    QRS Duration: 147 QT Interval:  422 QTC Calculation: 449 R Axis:   -88 Text Interpretation:  Atrial fibrillation RBBB and LAFB Probable left  ventricular hypertrophy Artifact SUGGEST REPEAT TRACING Confirmed by  [facility]  MD, Nunzio Cory [phone]) on 02/24/2016 4:40:00 PM      EKG Interpretation  Date/Time:  Monday February 24 2016 15:35:57 EDT Ventricular Rate:  68 PR Interval:  227 QRS Duration: 149 QT Interval:  430 QTC  Calculation: 457 R Axis:   -86 Text Interpretation:  Sinus rhythm with 1st degree A-V block Left axis deviation RBBB and LAFB Probable left ventricular hypertrophy When compared with ECG of 11/01/2015 No significant change was found Confirmed by [facility]  MD, Nunzio Cory [phone]) on 02/24/2016 4:41:51 PM        MDM  MDM Reviewed: previous chart, nursing note and vitals Reviewed previous: ECG and labs Interpretation: labs, ECG, x-ray and CT scan      Results for orders placed or performed during the hospital encounter of 02/24/16  Urinalysis, Routine w reflex microscopic (not at [facility])  Result Value Ref Range   Color, Urine YELLOW YELLOW   APPearance CLEAR CLEAR   Specific Gravity, Urine 1.005 1.005 - 1.030   pH 5.0 5.0 - 8.0   Glucose, UA NEGATIVE NEGATIVE mg/dL   Hgb urine dipstick MODERATE (A) NEGATIVE   Bilirubin Urine NEGATIVE NEGATIVE   Ketones, ur NEGATIVE NEGATIVE mg/dL   Protein, ur NEGATIVE NEGATIVE mg/dL   Nitrite NEGATIVE NEGATIVE   Leukocytes, UA NEGATIVE NEGATIVE  Lactic acid, plasma  Result Value Ref Range   Lactic Acid, Venous 2.3 (HH) 0.5 - 2.0 mmol/L  Troponin I  Result Value Ref Range   Troponin I <0.03 <0.031 ng/mL  Comprehensive metabolic panel  Result Value Ref Range   Sodium 132 (L) 135 - 145 mmol/L   Potassium 4.0 3.5 - 5.1 mmol/L   Chloride 97 (L) 101 - 111 mmol/L   CO2 26 22 - 32 mmol/L   Glucose, Bld 114 (H) 65 - 99 mg/dL   BUN 26 (H) 6 - 20 mg/dL   Creatinine, Ser 1.00 0.61 - 1.24 mg/dL   Calcium 8.5 (L) 8.9 - 10.3 mg/dL   Total Protein 6.7 6.5 - 8.1 g/dL   Albumin 3.2 (L) 3.5 - 5.0 g/dL   AST 35 15 - 41 U/L   ALT 27 17 - 63 U/L   Alkaline Phosphatase 79 38 - 126 U/L   Total Bilirubin 1.0 0.3 - 1.2 mg/dL   GFR calc non Af Amer >60 >60 mL/min   GFR calc Af Amer >60 >60 mL/min   Anion gap 9 5 - 15  Urine microscopic-add on  Result Value Ref Range   Squamous Epithelial / LPF 0-5 (A) NONE SEEN   WBC, UA 0-5 0 - 5 WBC/hpf   RBC / HPF 0-5 0 - 5  RBC/hpf   Bacteria, UA RARE (A) NONE SEEN  CBC with Differential  Result Value Ref Range   WBC 6.3 4.0 - 10.5 K/uL   RBC 4.89 4.22 - 5.81 MIL/uL   Hemoglobin 13.5 13.0 - 17.0 g/dL   HCT 40.5 39.0 - 52.0 %   MCV 82.8 78.0 - 100.0 fL   MCH 27.6 26.0 - 34.0 pg   MCHC 33.3 30.0 - 36.0 g/dL   RDW 14.1 11.5 - 15.5 %  Platelets 193 150 - 400 K/uL   Neutrophils Relative % 69 %   Neutro Abs 4.4 1.7 - 7.7 K/uL   Lymphocytes Relative 10 %   Lymphs Abs 0.6 (L) 0.7 - 4.0 K/uL   Monocytes Relative 20 %   Monocytes Absolute 1.3 (H) 0.1 - 1.0 K/uL   Eosinophils Relative 1 %   Eosinophils Absolute 0.1 0.0 - 0.7 K/uL   Basophils Relative 0 %   Basophils Absolute 0.0 0.0 - 0.1 K/uL   Dg Chest 2 View 02/24/2016  CLINICAL DATA:  Daughter states that he was lethargic and a little confused this morning and unable to ambulate on his own. This is not his norm. EXAM: CHEST - 2 VIEW COMPARISON:  11/01/2015 FINDINGS: Chronic linear scarring in the left upper lobe. 13 mm nodular opacity projecting over the posterior aspect left fifth rib was not evident on films from 08/12/2014. No focal airspace disease or overt edema. Heart size normal. No effusion. No pneumothorax. Minimal spurring in the mid thoracic spine. IMPRESSION: 1. New left upper lobe pulmonary nodule. CT chest would be useful for further characterization. Electronically Signed   By: Lucrezia Europe M.D.   On: 02/24/2016 16:35   Ct Head Wo Contrast 02/24/2016  CLINICAL DATA:  Increased weakness today. No known injury. Dementia. EXAM: CT HEAD WITHOUT CONTRAST TECHNIQUE: Contiguous axial images were obtained from the base of the skull through the vertex without intravenous contrast. COMPARISON:  Head CT 11/01/2015 and 12/26/2014. FINDINGS: Examination is mildly motion degraded. Brain: There is no evidence of acute intracranial hemorrhage, mass lesion, brain edema or extra-axial fluid collection. The ventricles and subarachnoid spaces are prominent but stable.  Patchy and confluent periventricular and subcortical white matter disease appears unchanged. No evidence of acute infarct. Bones/sinuses/visualized face: The visualized paranasal sinuses, mastoid air cells and middle ears are clear. The calvarium is intact. IMPRESSION: Stable atrophy and chronic small vessel ischemic changes. No acute intracranial findings. Electronically Signed   By: Richardean Sale M.D.   On: 02/24/2016 17:00   Ct Chest Wo Contrast 02/24/2016  CLINICAL DATA:  Questionable new pulmonary nodule in the left upper lung on chest radiograph from earlier today. EXAM: CT CHEST WITHOUT CONTRAST TECHNIQUE: Multidetector CT imaging of the chest was performed following the standard protocol without IV contrast. COMPARISON:  Chest radiograph from earlier today. 05/15/2009 chest CT. FINDINGS: Mediastinum/Nodes: Normal heart size. Stable trace pericardial fluid/ thickening. Three-vessel coronary atherosclerosis. Great vessels are normal in course and caliber. Normal visualized thyroid. Normal esophagus. No pathologically enlarged axillary, mediastinal or gross hilar lymph nodes, noting limited sensitivity for the detection of hilar adenopathy on this noncontrast study. Lungs/Pleura: No pneumothorax. No pleural effusion. Stable tiny calcified granulomas in the right upper, right middle and right lower lobes. There is a new irregular 1.5 x 1.0 cm peripheral left upper lobe pulmonary nodule (series 3/ image 23). No acute consolidative airspace disease or additional significant pulmonary nodules. Stable mild biapical pleural-parenchymal scarring. Upper abdomen: Unremarkable. Musculoskeletal: No aggressive appearing focal osseous lesions. Marked degenerative changes in the thoracic spine. IMPRESSION: 1. New irregular 1.5 x 1.0 cm peripheral left upper lobe pulmonary nodule, for which a primary bronchogenic carcinoma cannot be excluded. Recommend further evaluation with PET-CT. 2. No thoracic adenopathy. 3.  Three-vessel coronary atherosclerosis. Electronically Signed   By: Ilona Sorrel M.D.   On: 02/24/2016 18:03    2020:  Pt unable to stand without heavy assist from multiple ED staff. Pt unable to ambulate. Pt incont of foul  smelling urine several times while in the ED. No clear UTI on Udip; UC is pending. New pulmonary nodule on CT scan; will need f/u. Dx and testing d/w pt and family.  Questions answered.  Verb understanding.  T/C to [facility] Dr. Shanon Brow, case discussed, including:  HPI, pertinent PM/SHx, VS/PE, dx testing, ED course and treatment:  Agreeable to come to ED for evaluation for observation admission, requests to dose total 1L NS IVF.   Francine Graven, DO 02/26/16 2331

## 2016-02-24 NOTE — ED Notes (Signed)
CRITICAL VALUE ALERT  Critical value received:  Lactic 2.3  Date of notification:  02/24/16  Time of notification:  [id number]  Critical value read back: yes  Nurse who received alert:  Ilda Mori  MD notified (1st page):  [email or web address]  Time of first page:  [phone]  MD notified (2nd page):  Time of second page:  Responding MD:  mcmanus  Time MD responded:  [phone]

## 2016-02-24 NOTE — ED Notes (Signed)
Per EMS, called out for generalized weakness. Pt denies being weaker than usual. Denies and n/v/ or diarrhea

## 2016-02-24 NOTE — ED Notes (Signed)
Physician in to discuss with pt and family 

## 2016-02-24 NOTE — ED Notes (Signed)
PT NOTED TO HAVE PULLED OUT IV- BED CHANGED PT REPOSITIONED.

## 2016-02-24 NOTE — ED Notes (Signed)
RESTLESS AGITATED PULLING AT LINES

## 2016-02-24 NOTE — ED Notes (Signed)
Changed bed and gown patient was wet. He also pulled out his iv made nurse aware.

## 2016-02-24 NOTE — H&P (Signed)
PCP:   Sallee Lange, MD   Chief Complaint:  Confused, strong smelling urine,  Not walking  HPI: 82 yo male h/o dementia lives at home with his daughter and grandson usually walks with a walker at home brought in by daughter when he woke up this morning feeling very weak and could not stand.  Daughter reports for the last day or so he has been more incontinent of his bladder and the urine is very odorous.  No n/v/d.  No complaints of pain anywhere now but daughter reports when he urinates he says it burns.  He does not have frequent infections.  No known fevers.  The goal of the family is to keep him at home irregardless if he can ambulate and they are not interested in nursing home placement.  In the ED pt cannot get up from bed, and appears dry.  Pt referred for admission for his weakness.  At his baseline, he does not know who his daughter is half of his days.   Review of Systems:  Unobtainable from pt due to his dementia  Past Medical History: Past Medical History  Diagnosis Date  . Arthritis, hip   . Pneumonia 2002, 2010  . Never smoked any substance   . Mild dementia   . Assistance needed for mobility     walks with walker   Past Surgical History  Procedure Laterality Date  . Tonsillectomy      As a child  . Appendectomy      At age 5  . Hip surgery      left    Medications: Prior to Admission medications   Medication Sig Start Date End Date Taking? Authorizing Provider  apixaban (ELIQUIS) 2.5 MG TABS tablet Take 1 tablet (2.5 mg total) by mouth 2 (two) times daily. 12/24/15  Yes Kathyrn Drown, MD  Emollient [facility] EX) Apply 1 application topically at bedtime.   Yes Historical Provider, MD    Allergies:   Allergies  Allergen Reactions  . Ciprofloxacin     Neurologic, pt believes it caused a stroke  . Keflex [Cephalexin] Nausea And Vomiting  . Sulfur Other (See Comments)    Reaction is unknown    Social History:  reports that he has never smoked. He does  not have any smokeless tobacco history on file. He reports that he does not drink alcohol or use illicit drugs.  Family History: No premature CAD  Physical Exam: Filed Vitals:   02/24/16 1830 02/24/16 1900 02/24/16 1930 02/24/16 2000  BP: 145/79 145/89 138/110 114/66  Pulse:  65  72  Temp:      TempSrc:      Resp: 16 15 16 17   SpO2:  100%  95%   General appearance: alert, cooperative and no distress dry MMM Head: Normocephalic, without obvious abnormality, atraumatic Eyes: negative Nose: Nares normal. Septum midline. Mucosa normal. No drainage or sinus tenderness. Neck: no JVD and supple, symmetrical, trachea midline Lungs: clear to auscultation bilaterally Heart: regular rate and rhythm, S1, S2 normal, no murmur, click, rub or gallop Abdomen: soft, non-tender; bowel sounds normal; no masses,  no organomegaly Extremities: extremities normal, atraumatic, no cyanosis or edema Pulses: 2+ and symmetric Skin: Skin color, texture, turgor normal. No rashes or lesions Neurologic: Grossly normal  MAE, no focal deficits.  Follows simple commands.  Pleasantly demented    Labs on Admission:   Recent Labs  02/24/16 1721  NA 132*  K 4.0  CL 97*  CO2 26  GLUCOSE 114*  BUN 26*  CREATININE 1.00  CALCIUM 8.5*    Recent Labs  02/24/16 1721  AST 35  ALT 27  ALKPHOS 79  BILITOT 1.0  PROT 6.7  ALBUMIN 3.2*     Recent Labs  02/24/16 1937  WBC 6.3  NEUTROABS 4.4  HGB 13.5  HCT 40.5  MCV 82.8  PLT 193    Recent Labs  02/24/16 1549  TROPONINI <0.03    Radiological Exams on Admission: Dg Chest 2 View  02/24/2016  CLINICAL DATA:  Daughter states that he was lethargic and a little confused this morning and unable to ambulate on his own. This is not his norm. EXAM: CHEST - 2 VIEW COMPARISON:  11/01/2015 FINDINGS: Chronic linear scarring in the left upper lobe. 13 mm nodular opacity projecting over the posterior aspect left fifth rib was not evident on films from  08/12/2014. No focal airspace disease or overt edema. Heart size normal. No effusion. No pneumothorax. Minimal spurring in the mid thoracic spine. IMPRESSION: 1. New left upper lobe pulmonary nodule. CT chest would be useful for further characterization. Electronically Signed   By: Lucrezia Europe M.D.   On: 02/24/2016 16:35   Ct Head Wo Contrast  02/24/2016  CLINICAL DATA:  Increased weakness today. No known injury. Dementia. EXAM: CT HEAD WITHOUT CONTRAST TECHNIQUE: Contiguous axial images were obtained from the base of the skull through the vertex without intravenous contrast. COMPARISON:  Head CT 11/01/2015 and 12/26/2014. FINDINGS: Examination is mildly motion degraded. Brain: There is no evidence of acute intracranial hemorrhage, mass lesion, brain edema or extra-axial fluid collection. The ventricles and subarachnoid spaces are prominent but stable. Patchy and confluent periventricular and subcortical white matter disease appears unchanged. No evidence of acute infarct. Bones/sinuses/visualized face: The visualized paranasal sinuses, mastoid air cells and middle ears are clear. The calvarium is intact. IMPRESSION: Stable atrophy and chronic small vessel ischemic changes. No acute intracranial findings. Electronically Signed   By: Richardean Sale M.D.   On: 02/24/2016 17:00   Ct Chest Wo Contrast  02/24/2016  CLINICAL DATA:  Questionable new pulmonary nodule in the left upper lung on chest radiograph from earlier today. EXAM: CT CHEST WITHOUT CONTRAST TECHNIQUE: Multidetector CT imaging of the chest was performed following the standard protocol without IV contrast. COMPARISON:  Chest radiograph from earlier today. 05/15/2009 chest CT. FINDINGS: Mediastinum/Nodes: Normal heart size. Stable trace pericardial fluid/ thickening. Three-vessel coronary atherosclerosis. Great vessels are normal in course and caliber. Normal visualized thyroid. Normal esophagus. No pathologically enlarged axillary, mediastinal or  gross hilar lymph nodes, noting limited sensitivity for the detection of hilar adenopathy on this noncontrast study. Lungs/Pleura: No pneumothorax. No pleural effusion. Stable tiny calcified granulomas in the right upper, right middle and right lower lobes. There is a new irregular 1.5 x 1.0 cm peripheral left upper lobe pulmonary nodule (series 3/ image 23). No acute consolidative airspace disease or additional significant pulmonary nodules. Stable mild biapical pleural-parenchymal scarring. Upper abdomen: Unremarkable. Musculoskeletal: No aggressive appearing focal osseous lesions. Marked degenerative changes in the thoracic spine. IMPRESSION: 1. New irregular 1.5 x 1.0 cm peripheral left upper lobe pulmonary nodule, for which a primary bronchogenic carcinoma cannot be excluded. Recommend further evaluation with PET-CT. 2. No thoracic adenopathy. 3. Three-vessel coronary atherosclerosis. Electronically Signed   By: Ilona Sorrel M.D.   On: 02/24/2016 18:03    Assessment/Plan  82 yo male with moderate to advanced dementia with more confusion and possible uti  Principal Problem:   Weakness  generalized-  Will empirically treat for uti although ua is not that impressive, but due to symptoms place on iv rocephin and await urine cx.   Will give ivf overnight and obtain PT eval in am.    Active Problems:   Venous (peripheral) insufficiency- noted   Alzheimer's disease- noted   Foul smelling urine-  As above, empiric tx for uti   Lung mass- daughter is aware of this new lung mass and it is concerning for malignancy.  She will speak to her brother, but they will likely want no further work up of this mass.  Have discussed code status with daughter, there is also a son who is the HCPOA.  She thinks he would want their father to be DNR but is not sure.  i have asked her to discuss this with her brother and let us know.  For now he will be full code, but i suspect they will change this status prior to his  discharge.  i have also discussed treatment options with daughter including ivf and abx.  She would like for him to get ivf and abx overnight with the hopes that he will regain some strength after some hydration.  Irregardless of how he responds overnight (i have advised that it is very likely he will not be ambulating by tomorrow and discussed the natural course of infections in dementia patients) she would like for him to go home tomorrow.  Their home is not set up with bedside commode, etc.  Will obtain PT eval for am.  She will also discuss with her brother about the new lung mass, but it appears they will not want further work up of this due to his dementia which i feel is appropriate.  obs on  Medical.    Svara Twyman A 02/24/2016, 9:16 PM

## 2016-02-24 NOTE — ED Notes (Signed)
Pt's clothing soaked with urine. Pt cleaned and clothes changed. Pt confused at times. No family here with pt at present

## 2016-02-24 NOTE — ED Notes (Signed)
Pt incontinent of urine again. Pt changed. Pt asking what school he was at. Pt made aware that he was at [employer]

## 2016-02-24 NOTE — ED Notes (Signed)
Daughter here now. States he dad was doing fine until this morning. States he was lethargic and they were unable to ambulate him.

## 2016-02-25 DIAGNOSIS — I872 Venous insufficiency (chronic) (peripheral): Secondary | ICD-10-CM | POA: Diagnosis not present

## 2016-02-25 DIAGNOSIS — R531 Weakness: Secondary | ICD-10-CM | POA: Diagnosis not present

## 2016-02-25 DIAGNOSIS — G301 Alzheimer's disease with late onset: Secondary | ICD-10-CM

## 2016-02-25 DIAGNOSIS — R918 Other nonspecific abnormal finding of lung field: Secondary | ICD-10-CM | POA: Diagnosis not present

## 2016-02-25 DIAGNOSIS — F0280 Dementia in other diseases classified elsewhere without behavioral disturbance: Secondary | ICD-10-CM

## 2016-02-25 LAB — CBC
HCT: 39.2 % (ref 39.0–52.0)
HEMOGLOBIN: 12.9 g/dL — AB (ref 13.0–17.0)
MCH: 27.3 pg (ref 26.0–34.0)
MCHC: 32.9 g/dL (ref 30.0–36.0)
MCV: 82.9 fL (ref 78.0–100.0)
PLATELETS: 221 10*3/uL (ref 150–400)
RBC: 4.73 MIL/uL (ref 4.22–5.81)
RDW: 13.9 % (ref 11.5–15.5)
WBC: 7.7 10*3/uL (ref 4.0–10.5)

## 2016-02-25 LAB — BASIC METABOLIC PANEL
Anion gap: 10 (ref 5–15)
BUN: 25 mg/dL — ABNORMAL HIGH (ref 6–20)
CHLORIDE: 96 mmol/L — AB (ref 101–111)
CO2: 26 mmol/L (ref 22–32)
CREATININE: 0.99 mg/dL (ref 0.61–1.24)
Calcium: 8.2 mg/dL — ABNORMAL LOW (ref 8.9–10.3)
GFR calc non Af Amer: >60 mL/min (ref >60)
Glucose, Bld: 126 mg/dL — ABNORMAL HIGH (ref 65–99)
POTASSIUM: 4.1 mmol/L (ref 3.5–5.1)
SODIUM: 132 mmol/L — AB (ref 135–145)

## 2016-02-25 LAB — INFLUENZA PANEL BY PCR (TYPE A & B)
H1N1FLUPCR: NOT DETECTED
INFLBPCR: NEGATIVE
Influenza A By PCR: NEGATIVE

## 2016-02-25 MED ORDER — APIXABAN 5 MG PO TABS
2.5000 mg | ORAL_TABLET | Freq: Two times a day (BID) | ORAL | Status: DC
  Administered 2016-02-25 – 2016-02-27 (×6): 2.5 mg via ORAL
  Filled 2016-02-25 (×8): qty 1

## 2016-02-25 MED ORDER — APIXABAN 2.5 MG PO TABS
ORAL_TABLET | ORAL | Status: AC
  Filled 2016-02-25: qty 1

## 2016-02-25 MED ORDER — CEFTRIAXONE SODIUM 1 G IJ SOLR
1.0000 g | Freq: Once | INTRAMUSCULAR | Status: AC
  Administered 2016-02-25: 1 g via INTRAMUSCULAR
  Filled 2016-02-25: qty 10

## 2016-02-25 MED ORDER — STERILE WATER FOR INJECTION IJ SOLN
INTRAMUSCULAR | Status: AC
  Filled 2016-02-25: qty 10

## 2016-02-25 MED ORDER — ONDANSETRON HCL 4 MG/2ML IJ SOLN: 4.0000 mg | Freq: Four times a day (QID) | INTRAMUSCULAR | Status: DC | PRN

## 2016-02-25 MED ORDER — ONDANSETRON HCL 4 MG PO TABS: 4.0000 mg | ORAL_TABLET | Freq: Four times a day (QID) | ORAL | Status: DC | PRN

## 2016-02-25 MED ORDER — SODIUM CHLORIDE 0.9 % IV SOLN
INTRAVENOUS | Status: AC
Start: 2016-02-25 — End: 2016-02-25

## 2016-02-25 NOTE — ED Notes (Signed)
Pt continues very agitated and cannot be redirected. He is confused and pulling at all lines and blankets

## 2016-02-25 NOTE — Care Management Note (Signed)
Case Management Note  Patient Details  Name: DAIGAN MUDD MRN: [id number] Date of Birth: 1926-12-31  Subjective/Objective:                  Pt is from home, lives with daughter and grandson. Pt's family is very supportive and provides 24/7 supervision. Pt uses a walker at baseline for mobility. Pt has used [facility] in the past and daughter would like to use them again. Daughter has requested [facility] RN at DC. Pt has PCP, transportation and no difficulty obtaining his meds.   Action/Plan: Anticipate DC home with [facility] services. Will cont to follow and make arrangements for [facility] as appropriate.   Expected Discharge Date:     02/26/2016             Expected Discharge Plan:  [place]  In-House Referral:  NA  Discharge planning Services  CM Consult  Post Acute Care Choice:  Home Health Choice offered to:  Adult Children  DME Arranged:    DME Agency:     HH Arranged:    HH Agency:  [place]  Status of Service:  In process, will continue to follow  Medicare Important Message Given:    Date Medicare IM Given:    Medicare IM give by:    Date Additional Medicare IM Given:    Additional Medicare Important Message give by:     If discussed at [place] of Stay Meetings, dates discussed:    Additional Comments:  Sherald Barge, RN 02/25/2016, 3:39 PM

## 2016-02-25 NOTE — ED Notes (Signed)
Pt continues confused with restlessness and agitation- pinching- unable to redirect

## 2016-02-25 NOTE — ED Notes (Signed)
Physician called re: pt pulling out IVs as well as inability to redirect- request for change of med to IM from IV- physician agreed

## 2016-02-25 NOTE — Progress Notes (Signed)
PT Cancellation Note  Patient Details Name: [name] MRN: [id number] DOB: March 12, 1927   Cancelled Treatment:    Reason Eval/Treat Not Completed: Patient not medically ready. Chart reviewed, RN consulted. Holding pt treatment at this time due to a restless night with agitation, multiple episodes of pulling out IV. Pt now appears drowsy and somnolent. The family has a strong desire to return the patient to home if ambulatory, hence I will allow the patient to rest and allow meds more time, prior to appraisal of basic mobility. This should allow the patient to demonstrate an optimal performance. Will attempt at later date/time.     9:07 AM, 02/25/2016 Etta Grandchild, PT, DPT PRN Physical Therapist at [place] License # [id number] (wireless)  [phone] (mobile)

## 2016-02-25 NOTE — ED Notes (Signed)
Call to [facility] re pt agitation and confusion.

## 2016-02-25 NOTE — Progress Notes (Signed)
[facility] PROGRESS NOTE  [name] [id number] DOB: 1927-10-25 DOA: 02/24/2016 PCP: Sallee Lange, MD  Assessment/Plan: 1. Generalized weakness- likely from dehydration, advanced dementia. ? UTI. Patient was given 1 dose of empiric  ceftriaxone but UA was normal. Follow urine culture results.  2. Lung mass- chest x-ray showed new left upper lobe pulmonary nodule. CT chest was done which showed 1.5-1 cm peripheral left upper lobe parotid nodule. Bronchogenic carcinoma cannot be excluded. Follow-up PET/CT recommended. I called and discussed with patient's daughter, who says they're not interested in further workup at this time. She will discuss with healthcare power of attorney her brother. 3. History of left leg DVT- continue apixaban  Code Status: Full code Family Communication: *Discussed with patient's daughter on phone Disposition Plan: Home when urine culture resulted   Consultants:  None  Procedures:  None  Antibiotics:   None   HPI/Subjective: 82 yo male h/o dementia lives at home with his daughter and grandson usually walks with a walker at home brought in by daughter when he woke up this morning feeling very weak and could not stand. Daughter reports for the last day or so he has been more incontinent of his bladder and the urine is very odorous. No n/v/d. No complaints of pain anywhere now but daughter reports when he urinates he says it burns. He does not have frequent infections. No known fevers. The goal of the family is to keep him at home irregardless if he can ambulate and they are not interested in nursing home placement.  Patient communicating this morning. Denies any complaints.  Objective: Filed Vitals:   02/25/16 0247 02/25/16 0553  BP: 157/97 157/94  Pulse: 82 84  Temp: 98.4 F (36.9 C)   Resp: 20 17   No intake or output data in the 24 hours ending 02/25/16 1015 There were no vitals filed for this visit.  Exam:   General:    Appears in no acute distress  Cardiovascular- S1-S2 regular  Respiratory:  Clear to auscultation bilaterally  Abdomen:  Soft, nontender, no organomegaly  Musculoskeletal:  No cyanosis/clubbing/edema of the lower extremities   Data Reviewed: Basic Metabolic Panel:  Recent Labs Lab 02/24/16 1721 02/25/16 0321  NA 132* 132*  K 4.0 4.1  CL 97* 96*  CO2 26 26  GLUCOSE 114* 126*  BUN 26* 25*  CREATININE 1.00 0.99  CALCIUM 8.5* 8.2*   Liver Function Tests:  Recent Labs Lab 02/24/16 1721  AST 35  ALT 27  ALKPHOS 79  BILITOT 1.0  PROT 6.7  ALBUMIN 3.2*   CBC:  Recent Labs Lab 02/24/16 1937 02/25/16 0321  WBC 6.3 7.7  NEUTROABS 4.4  --   HGB 13.5 12.9*  HCT 40.5 39.2  MCV 82.8 82.9  PLT 193 221   Cardiac Enzymes:  Recent Labs Lab 02/24/16 1549  TROPONINI <0.03    Studies: Dg Chest 2 View  02/24/2016  CLINICAL DATA:  Daughter states that he was lethargic and a little confused this morning and unable to ambulate on his own. This is not his norm. EXAM: CHEST - 2 VIEW COMPARISON:  11/01/2015 FINDINGS: Chronic linear scarring in the left upper lobe. 13 mm nodular opacity projecting over the posterior aspect left fifth rib was not evident on films from 08/12/2014. No focal airspace disease or overt edema. Heart size normal. No effusion. No pneumothorax. Minimal spurring in the mid thoracic spine. IMPRESSION: 1. New left upper lobe pulmonary nodule. CT chest would be useful for further  characterization. Electronically Signed   By: Lucrezia Europe M.D.   On: 02/24/2016 16:35   Ct Head Wo Contrast  02/24/2016  CLINICAL DATA:  Increased weakness today. No known injury. Dementia. EXAM: CT HEAD WITHOUT CONTRAST TECHNIQUE: Contiguous axial images were obtained from the base of the skull through the vertex without intravenous contrast. COMPARISON:  Head CT 11/01/2015 and 12/26/2014. FINDINGS: Examination is mildly motion degraded. Brain: There is no evidence of acute intracranial  hemorrhage, mass lesion, brain edema or extra-axial fluid collection. The ventricles and subarachnoid spaces are prominent but stable. Patchy and confluent periventricular and subcortical white matter disease appears unchanged. No evidence of acute infarct. Bones/sinuses/visualized face: The visualized paranasal sinuses, mastoid air cells and middle ears are clear. The calvarium is intact. IMPRESSION: Stable atrophy and chronic small vessel ischemic changes. No acute intracranial findings. Electronically Signed   By: Richardean Sale M.D.   On: 02/24/2016 17:00   Ct Chest Wo Contrast  02/24/2016  CLINICAL DATA:  Questionable new pulmonary nodule in the left upper lung on chest radiograph from earlier today. EXAM: CT CHEST WITHOUT CONTRAST TECHNIQUE: Multidetector CT imaging of the chest was performed following the standard protocol without IV contrast. COMPARISON:  Chest radiograph from earlier today. 05/15/2009 chest CT. FINDINGS: Mediastinum/Nodes: Normal heart size. Stable trace pericardial fluid/ thickening. Three-vessel coronary atherosclerosis. Great vessels are normal in course and caliber. Normal visualized thyroid. Normal esophagus. No pathologically enlarged axillary, mediastinal or gross hilar lymph nodes, noting limited sensitivity for the detection of hilar adenopathy on this noncontrast study. Lungs/Pleura: No pneumothorax. No pleural effusion. Stable tiny calcified granulomas in the right upper, right middle and right lower lobes. There is a new irregular 1.5 x 1.0 cm peripheral left upper lobe pulmonary nodule (series 3/ image 23). No acute consolidative airspace disease or additional significant pulmonary nodules. Stable mild biapical pleural-parenchymal scarring. Upper abdomen: Unremarkable. Musculoskeletal: No aggressive appearing focal osseous lesions. Marked degenerative changes in the thoracic spine. IMPRESSION: 1. New irregular 1.5 x 1.0 cm peripheral left upper lobe pulmonary nodule, for  which a primary bronchogenic carcinoma cannot be excluded. Recommend further evaluation with PET-CT. 2. No thoracic adenopathy. 3. Three-vessel coronary atherosclerosis. Electronically Signed   By: Ilona Sorrel M.D.   On: 02/24/2016 18:03    Scheduled Meds: . apixaban  2.5 mg Oral BID  . sterile water (preservative free)       Continuous Infusions: . sodium chloride    . sodium chloride      Principal Problem:   Weakness generalized Active Problems:   Venous (peripheral) insufficiency   Alzheimer's disease   Foul smelling urine   Lung mass    Time spent: 25 min    [facility] Pager [phone]. If 7PM-7AM, please contact night-coverage at [email or web address], password [email or web address] 02/25/2016, 10:15 AM

## 2016-02-25 NOTE — Progress Notes (Signed)
Report given to [facility] RN. Pt stable at this time, sitter at bedside.

## 2016-02-25 NOTE — Care Management Obs Status (Signed)
[place] NOTIFICATION   Patient Details  Name: KAMARIAN CASHELL MRN: [id number] Date of Birth: [birth year]   Medicare Observation Status Notification Given:  Yes    Sherald Barge, RN 02/25/2016, 3:39 PM

## 2016-02-26 ENCOUNTER — Telehealth: Payer: Self-pay | Admitting: Family Medicine

## 2016-02-26 ENCOUNTER — Observation Stay (HOSPITAL_COMMUNITY): Payer: Medicare Other

## 2016-02-26 DIAGNOSIS — G301 Alzheimer's disease with late onset: Secondary | ICD-10-CM | POA: Diagnosis not present

## 2016-02-26 DIAGNOSIS — F0280 Dementia in other diseases classified elsewhere without behavioral disturbance: Secondary | ICD-10-CM | POA: Diagnosis not present

## 2016-02-26 DIAGNOSIS — R531 Weakness: Secondary | ICD-10-CM | POA: Diagnosis not present

## 2016-02-26 LAB — URINE CULTURE: CULTURE: NO GROWTH

## 2016-02-26 LAB — TSH: TSH: 2.838 u[IU]/mL (ref 0.350–4.500)

## 2016-02-26 LAB — VITAMIN B12: VITAMIN B 12: 374 pg/mL (ref 180–914)

## 2016-02-26 MED ORDER — ENSURE ENLIVE PO LIQD
237.0000 mL | Freq: Two times a day (BID) | ORAL | Status: DC
  Administered 2016-02-27 – 2016-03-02 (×7): 237 mL via ORAL

## 2016-02-26 NOTE — Telephone Encounter (Signed)
Notified Colette (daughter) it is generally standard to do anticoagulation for approximately 4 months then reassess. Dr. Nicki Reaper would recommend that Colette speak with the hospitalist regarding doing a follow-up ultrasound. It is possible that they may advise for her to keep her dad on this medication in then as an outpatient in April we will recheck this. Dr. Nicki Reaper is uncertain why the son is angry about this patient being on proper therapy for DVTs. Her dad is at high risk of additional DVTs because of inability to move around. Currently her dad is under the care of the hospitalists-we do not have any privileges at the hospital to supersede or ordered testing at this point. When he is discharge for her to please let me know. Dr. Nicki Reaper will be happy to do a home visit in follow-up. Colette verbalized understanding.

## 2016-02-26 NOTE — Telephone Encounter (Signed)
It is generally standard to do anticoagulation for approximately 4 months then reassess. I would recommend that Colette speak with the hospitalist regarding doing a follow-up ultrasound. It is possible that they may advise for her to keep her dad on this medication in then as an outpatient in April we will recheck this. I am uncertain why the son is angry about this patient being on proper therapy for DVTs. Her dad is at high risk of additional DVTs because of inability to move around. Currently her dad is under the care of the hospitalists-we do not have any privileges at the hospital to supersede or ordered testing at this point. When he is discharge for her to please let me know. I will be happy to do a home visit in follow-up.

## 2016-02-26 NOTE — Evaluation (Addendum)
Physical Therapy Evaluation Patient Details Name: [name] MRN: [id number] DOB: 06-17-1927 Today's Date: 02/26/2016   History of Present Illness  [name], [name] is an 82yo white male who is brought to [facility] after sudden onset weakness, AMS, and odorous urine. He endorses pyuria when asks but initital UA is negative. PMH: dementia and prior THA 10YA per family. At baseline, pt requires supervision for all ADL, and total assistance for IADL. Grandson lives with him 24/7 who endorses PLOF as limited distances within the house witheither RW or [facility], and daily playing of the piano, reading of the bible. Early Chars denies language deficits at baseline. At eval, pt is unable to report his age, maintain a clear train of thought without confusion, and is unable to recognize that he is in a hospital.   Clinical Impression  At evaluation, pt is received semirecumbent in bed upon entry, no family/caregiver present, however subjective history was obtained from Daughter and Grandson yesterday at bedside. The pt is sleep but awakens easily and is agreeable to participate. We speak at length about his piano playing. No acute distress noted at this time. The pt is alert and oriented to self only with intermittent frustration as he is aware of his own disorientation. The pt is pleasant, conversational, and following simple commands consistently with additional time required, and some verbal cues to remain on task. Family denies any falls history. Pt grip strength is strong and symmetrical; global strength as screened during functional mobility assessment presents with moderate impairment, the pt now requiring physical assistance for bed mobility, transfers, and gait, whereas the patient performs these indep at baseline. The patient also demonstrates some limitation in joint range, ambulating on flexed knees and in rigid kyphosis with forward head posture. Pt falls risk is high as evidenced by slow gait speed, forward  reach <10", and unsafe use of RW.    Patient presenting with impairment of strength, range of motion, balance, and activity tolerance, limiting ability to perform ADL and mobility tasks at  baseline level of function. Patient will benefit from skilled intervention to address the above impairments and limitations, in order to restore to prior level of function, improve patient safety upon discharge, and to decrease falls risk. Pt will need 24/7 supervision, and additional assistance with bed mobility/ transfers upon return to home until he returns to his baseline function in mobility.     Follow Up Recommendations Home health PT    Equipment Recommendations  None recommended by PT    Recommendations for Other Services       Precautions / Restrictions Precautions Precautions: None Precaution Comments: No score available       Mobility  Bed Mobility Overal bed mobility: Needs Assistance Bed Mobility: Supine to Sit;Sit to Supine     Supine to sit: Mod assist Sit to supine: Mod assist   General bed mobility comments: fairly weak, no difficulty at baseline.   Transfers Overall transfer level: Needs assistance Equipment used: Rolling walker (2 wheeled) Transfers: Sit to/from Stand Sit to Stand: From elevated surface;Min guard         General transfer comment: tall patient with very long legs, performs well once he comes to EOB which requires verbalc cues.   Ambulation/Gait Ambulation/Gait assistance: Min guard Ambulation Distance (Feet): 30 Feet Assistive device: Rolling walker (2 wheeled)     Gait velocity interpretation: <1.8 ft/sec, indicative of risk for recurrent falls General Gait Details: very slow, lacking continuous ambulation, lots of pausing, mild confusion  about obstacles in room, n recollection of room after return. Walks on bent knees, fairiy stable.   Stairs            Wheelchair Mobility    Modified Rankin (Stroke Patients Only)       Balance  Overall balance assessment: No apparent balance deficits (not formally assessed);Needs assistance Sitting-balance support: No upper extremity supported;Feet supported Sitting balance-Leahy Scale: Good     Standing balance support: During functional activity;Bilateral upper extremity supported Standing balance-Leahy Scale: Fair                               Pertinent Vitals/Pain Pain Assessment: No/denies pain    Home Living Family/patient expects to be discharged to:: Private residence Living Arrangements: Children;Other relatives Available Help at Discharge: Family Type of Home: House Home Access: Stairs to enter Entrance Stairs-Rails: Right Entrance Stairs-Number of Steps: 2 Home Layout: One level Home Equipment: Walker - 2 wheels;Bedside commode;Cane - single point      Prior Function Level of Independence: Independent with assistive device(s);Needs assistance   Gait / Transfers Assistance Needed: very limited distances in the house, limited by fatigue.   ADL's / Homemaking Assistance Needed: supervision        Hand Dominance        Extremity/Trunk Assessment   Upper Extremity Assessment: Generalized weakness;Overall [facility] for tasks assessed           Lower Extremity Assessment: Generalized weakness;Overall WFL for tasks assessed (walks on bent knees. )      Cervical / Trunk Assessment: Kyphotic  Communication   Communication: Expressive difficulties (mild anomia and short term memory deficits. )  Cognition Arousal/Alertness: Awake/alert Behavior During Therapy: WFL for tasks assessed/performed Overall Cognitive Status: Impaired/Different from baseline Area of Impairment: Orientation;Attention;Memory;Following commands;Awareness Orientation Level: Person Current Attention Level: Sustained;Selective Memory: Decreased short-term memory;Decreased recall of precautions Following Commands: Follows one step commands consistently;Follows multi-step  commands inconsistently   Awareness: Anticipatory;Intellectual   General Comments: Seems more confused than described baseline.     General Comments      Exercises        Assessment/Plan    PT Assessment Patient needs continued PT services  PT Diagnosis Difficulty walking;Abnormality of gait;Generalized weakness;Altered mental status   PT Problem List Decreased strength;Decreased range of motion;Decreased activity tolerance;Decreased balance;Decreased mobility;Decreased cognition;Decreased safety awareness;Decreased knowledge of use of DME;Decreased knowledge of precautions;Cardiopulmonary status limiting activity  PT Treatment Interventions Gait training;Stair training;Functional mobility training;Therapeutic activities;Therapeutic exercise;Patient/family education   PT Goals (Current goals can be found in the Care Plan section) Acute Rehab PT Goals Patient Stated Goal: regain strength and indep mobility.  PT Goal Formulation: With family Time For Goal Achievement: 03/11/16 Potential to Achieve Goals: Fair    Frequency Min 3X/week   Barriers to discharge        Co-evaluation               End of Session Equipment Utilized During Treatment: Gait belt Activity Tolerance: Patient tolerated treatment well;No increased pain;Other (comment) (pt is confused and unable to provide consistent feedback about pain or fatigue. ) Patient left: in bed;with call bell/phone within reach;with bed alarm set Nurse Communication: Mobility status;Other (comment)    Functional Assessment Tool Used: Clinical Judgment Functional Limitation: Mobility: Walking and moving around Mobility: Walking and Moving Around Current Status [phone]): At least 60 percent but less than 80 percent impaired, limited or restricted Mobility: Walking and Moving Around Goal  Status [phone]): At least 60 percent but less than 80 percent impaired, limited or restricted    Time: 0919-0942 PT Time Calculation (min)  (ACUTE ONLY): 23 min   Charges:   PT Evaluation $PT Eval High Complexity: 1 Procedure PT Treatments $Therapeutic Activity: 23-37 mins   PT G Codes:   PT G-Codes **NOT FOR INPATIENT CLASS** Functional Assessment Tool Used: Clinical Judgment Functional Limitation: Mobility: Walking and moving around Mobility: Walking and Moving Around Current Status [id number]): At least 60 percent but less than 80 percent impaired, limited or restricted Mobility: Walking and Moving Around Goal Status [phone]): At least 60 percent but less than 80 percent impaired, limited or restricted    10:06 AM, 02/26/2016 Etta Grandchild, PT, DPT PRN Physical Therapist at [place] License # [id number] (wireless)  [phone] (mobile)

## 2016-02-26 NOTE — Progress Notes (Addendum)
[facility] PROGRESS NOTE  [name] [id number] DOB: 1927/03/30 DOA: 02/24/2016 PCP: Sallee Lange, MD  Assessment/Plan: 1-hyponatremia, dehydration.  Continue with IV fluids, increase to 100/   2-Generalized weakness- likely from dehydration, advanced dementia. ? UTI. Patient was given 1 dose of empiric  ceftriaxone but UA was normal. Follow urine culture results.  Awaiting urine culture.  Influenza negative.  CT head negative.  Check TSH and B 12.   3--Lung mass- chest x-ray showed new left upper lobe pulmonary nodule. CT chest was done which showed 1.5-1 cm peripheral left upper lobe parotid nodule. Bronchogenic carcinoma cannot be excluded. Follow-up PET/CT recommended. Dr Frederich Chick discussed with patient's daughter, who says they're not interested in further workup at this time. She will discuss with healthcare power of attorney her brother. This will need to be follow up outpatient if family wishes.   4-History of left leg DVT- continue apixaban Discussed with daughter, her brother does not wants to continue this medications because there is not antidote to reverse effect. i think patient needs to continue anticoagulation. Explain to her he is at high risk for blood clot now we have lung nodule that could be cancer. Will get repeated doppler. She will let me know decision regarding change of medications to Lovenox/coumadin.   Code Status: Full code Family Communication: no family at bedside.  Disposition Plan: Home when urine culture resulted   Consultants:  None  Procedures:  None  Antibiotics:   None   HPI/Subjective: Patient alert, pleasantly confused/   Objective: Filed Vitals:   02/25/16 2100 02/26/16 0500  BP: 142/85 162/86  Pulse: 78 68  Temp: 98.2 F (36.8 C) 98.4 F (36.9 C)  Resp: 18 18    Intake/Output Summary (Last 24 hours) at 02/26/16 1112 Last data filed at 02/26/16 0600  Gross per 24 hour  Intake    875 ml  Output      0 ml  Net     875 ml   There were no vitals filed for this visit.  Exam:   General:   Appears in no acute distress  Cardiovascular- S1-S2 regular  Respiratory:  Clear to auscultation bilaterally  Abdomen:  Soft, nontender, no organomegaly  Musculoskeletal:  No cyanosis/clubbing/edema of the lower extremities   Data Reviewed: Basic Metabolic Panel:  Recent Labs Lab 02/24/16 1721 02/25/16 0321  NA 132* 132*  K 4.0 4.1  CL 97* 96*  CO2 26 26  GLUCOSE 114* 126*  BUN 26* 25*  CREATININE 1.00 0.99  CALCIUM 8.5* 8.2*   Liver Function Tests:  Recent Labs Lab 02/24/16 1721  AST 35  ALT 27  ALKPHOS 79  BILITOT 1.0  PROT 6.7  ALBUMIN 3.2*   CBC:  Recent Labs Lab 02/24/16 1937 02/25/16 0321  WBC 6.3 7.7  NEUTROABS 4.4  --   HGB 13.5 12.9*  HCT 40.5 39.2  MCV 82.8 82.9  PLT 193 221   Cardiac Enzymes:  Recent Labs Lab 02/24/16 1549  TROPONINI <0.03    Studies: Dg Chest 2 View  02/24/2016  CLINICAL DATA:  Daughter states that he was lethargic and a little confused this morning and unable to ambulate on his own. This is not his norm. EXAM: CHEST - 2 VIEW COMPARISON:  11/01/2015 FINDINGS: Chronic linear scarring in the left upper lobe. 13 mm nodular opacity projecting over the posterior aspect left fifth rib was not evident on films from 08/12/2014. No focal airspace disease or overt edema. Heart size normal. No effusion.  No pneumothorax. Minimal spurring in the mid thoracic spine. IMPRESSION: 1. New left upper lobe pulmonary nodule. CT chest would be useful for further characterization. Electronically Signed   By: Lucrezia Europe M.D.   On: 02/24/2016 16:35   Ct Head Wo Contrast  02/24/2016  CLINICAL DATA:  Increased weakness today. No known injury. Dementia. EXAM: CT HEAD WITHOUT CONTRAST TECHNIQUE: Contiguous axial images were obtained from the base of the skull through the vertex without intravenous contrast. COMPARISON:  Head CT 11/01/2015 and 12/26/2014. FINDINGS: Examination  is mildly motion degraded. Brain: There is no evidence of acute intracranial hemorrhage, mass lesion, brain edema or extra-axial fluid collection. The ventricles and subarachnoid spaces are prominent but stable. Patchy and confluent periventricular and subcortical white matter disease appears unchanged. No evidence of acute infarct. Bones/sinuses/visualized face: The visualized paranasal sinuses, mastoid air cells and middle ears are clear. The calvarium is intact. IMPRESSION: Stable atrophy and chronic small vessel ischemic changes. No acute intracranial findings. Electronically Signed   By: Richardean Sale M.D.   On: 02/24/2016 17:00   Ct Chest Wo Contrast  02/24/2016  CLINICAL DATA:  Questionable new pulmonary nodule in the left upper lung on chest radiograph from earlier today. EXAM: CT CHEST WITHOUT CONTRAST TECHNIQUE: Multidetector CT imaging of the chest was performed following the standard protocol without IV contrast. COMPARISON:  Chest radiograph from earlier today. 05/15/2009 chest CT. FINDINGS: Mediastinum/Nodes: Normal heart size. Stable trace pericardial fluid/ thickening. Three-vessel coronary atherosclerosis. Great vessels are normal in course and caliber. Normal visualized thyroid. Normal esophagus. No pathologically enlarged axillary, mediastinal or gross hilar lymph nodes, noting limited sensitivity for the detection of hilar adenopathy on this noncontrast study. Lungs/Pleura: No pneumothorax. No pleural effusion. Stable tiny calcified granulomas in the right upper, right middle and right lower lobes. There is a new irregular 1.5 x 1.0 cm peripheral left upper lobe pulmonary nodule (series 3/ image 23). No acute consolidative airspace disease or additional significant pulmonary nodules. Stable mild biapical pleural-parenchymal scarring. Upper abdomen: Unremarkable. Musculoskeletal: No aggressive appearing focal osseous lesions. Marked degenerative changes in the thoracic spine. IMPRESSION: 1.  New irregular 1.5 x 1.0 cm peripheral left upper lobe pulmonary nodule, for which a primary bronchogenic carcinoma cannot be excluded. Recommend further evaluation with PET-CT. 2. No thoracic adenopathy. 3. Three-vessel coronary atherosclerosis. Electronically Signed   By: Ilona Sorrel M.D.   On: 02/24/2016 18:03    Scheduled Meds: . apixaban  2.5 mg Oral BID   Continuous Infusions: . sodium chloride 75 mL/hr at 02/26/16 1102    Principal Problem:   Weakness generalized Active Problems:   Venous (peripheral) insufficiency   Alzheimer's disease   Foul smelling urine   Lung mass    Time spent: 25 min    Kalijah Westfall, [facility] Pager [phone]. If 7PM-7AM, please contact night-coverage at [email or web address], password [facility] 02/26/2016, 11:12 AM

## 2016-02-26 NOTE — Telephone Encounter (Signed)
Pt was admitted for UTI, not walking, very confused Done chest xray and found cancer in his chest according to [facility] Her brother is wanting to have him come off the eliquis that he is on  He is angry that he is still on it and wants him off immediately. She is also  Requesting an [place] of his legs to see if he still has the clots to keep him on  Eliquis. Collette and the brother are not on the same page for this med.   I advised he was in the Hospital and the Hospitalitis would have to be the one to  Order a [place] right now that I did not think you could. That I would send you this message  And get your response in what to do from here.   Told Collette someone would call her back with your response

## 2016-02-27 ENCOUNTER — Observation Stay (HOSPITAL_COMMUNITY): Payer: Medicare Other

## 2016-02-27 DIAGNOSIS — G301 Alzheimer's disease with late onset: Secondary | ICD-10-CM | POA: Diagnosis not present

## 2016-02-27 DIAGNOSIS — R531 Weakness: Secondary | ICD-10-CM | POA: Diagnosis not present

## 2016-02-27 DIAGNOSIS — F0280 Dementia in other diseases classified elsewhere without behavioral disturbance: Secondary | ICD-10-CM | POA: Diagnosis not present

## 2016-02-27 LAB — BASIC METABOLIC PANEL
Anion gap: 8 (ref 5–15)
BUN: 15 mg/dL (ref 6–20)
CALCIUM: 7.9 mg/dL — AB (ref 8.9–10.3)
CHLORIDE: 100 mmol/L — AB (ref 101–111)
CO2: 26 mmol/L (ref 22–32)
CREATININE: 0.78 mg/dL (ref 0.61–1.24)
Glucose, Bld: 107 mg/dL — ABNORMAL HIGH (ref 65–99)
Potassium: 3.7 mmol/L (ref 3.5–5.1)
SODIUM: 134 mmol/L — AB (ref 135–145)

## 2016-02-27 MED ORDER — APIXABAN 5 MG PO TABS
2.5000 mg | ORAL_TABLET | Freq: Two times a day (BID) | ORAL | Status: AC
  Administered 2016-02-27: 2.5 mg via ORAL
  Filled 2016-02-27: qty 1

## 2016-02-27 NOTE — Consult Note (Signed)
Patient's chart briefly reviewed.  He is a candidate for continued anticoagulation.  Son is refusing anticoagulation due to "lack of reversal of Eliquis."  Fortunately, for life-threatening situations, idarucizumab is approved for reversal.  He would benefit from anticoagulation particularly in light of a new lung mass, not yet diagnosed.  I would recommend anticoagulation with Heparin which can be quickly reversed.  If patient/family refuse, then document this.    I will be glad to perform an official consult tomorrow as the patient will not be discharged today.  Hematology will see patient tomorrow afternoon after clinic.  Robynn Pane, PA-C 02/27/2016 4:43 PM

## 2016-02-27 NOTE — Care Management Note (Signed)
Case Management Note  Patient Details  Name: [name] MRN: [id number] Date of Birth: 05-25-1927  Subjective/Objective:     Referral placed with [facility] per family request. Open to advanced previously . Referral called to [facility]. PT RN and AID. Orders present in EPIC.     Family informed Nurse that they were purchasing a queen bed that does everything a hospital bed will do.          Action/Plan: Home with family care and home health services.   Expected Discharge Date:                  Expected Discharge Plan:  [place]  In-House Referral:  NA  Discharge planning Services  CM Consult  Post Acute Care Choice:  Home Health Choice offered to:  Adult Children  DME Arranged:    DME Agency:     HH Arranged:    HH Agency:  [facility]  Status of Service:  Completed, signed off  Medicare Important Message Given:    Date Medicare IM Given:    Medicare IM give by:    Date Additional Medicare IM Given:    Additional Medicare Important Message give by:     If discussed at [place] of Stay Meetings, dates discussed:    Additional Comments:  Alvie Heidelberg, RN 02/27/2016, 11:10 AM

## 2016-02-27 NOTE — Consult Note (Signed)
   [facility] Inpatient Consult   02/27/2016  [name] January 27, 1927 [id number]   Patient screened for potential [facility] Management services. Patient is eligible for [place]. Epic reviewed and there were no identifiable [facility] care management needs at this time. [facility] Care Management services not appropriate at this time. If patient's post hospital needs change please place a [facility] Care Management consult.  For questions please contact:   Royetta Crochet. Laymond Purser, RN, BSN, [place] [phone]) Business Cell  [phone]) Toll Free Office

## 2016-02-27 NOTE — Progress Notes (Addendum)
[facility] PROGRESS NOTE  SEDERICK LASTINGER [id number] DOB: 07/18/1927 DOA: 02/24/2016 PCP: Sallee Lange, MD  Assessment/Plan: 1-hyponatremia, dehydration.  Continue with IV fluids, improved.   2-Generalized weakness- likely from dehydration, advanced dementia. ? UTI. Patient was given 1 dose of empiric  ceftriaxone but UA was normal. Follow urine culture results.  Awaiting urine culture.  Influenza negative.  CT head negative.  TSH normal  and B 12 normal. .   3--Lung mass- no further work up per family.   4-History of left leg DVT- on  apixaban Doppler with persistent Blood clot, patient is at increase risk for developing further blood clot if anticoagulation is stop due to bed bound status and now with lung nodule.  After discussion with oncologist the decision was not to continue with anticoagulation.    Code Status: Full code Family Communication: son , and daughter  Disposition Plan: Son relates that he is afraid that his father wont have care at home. He doesn't think that his sister will be able to provide care. I will keep patient overnight, will get PT. Will need CM to help family arranging 24 hours. They only have part time. i have consulted palliative.    Consultants:  None  Procedures:  None  Antibiotics:   None   HPI/Subjective: Patient alert, pleasantly confused/ no new complaints.   Objective: Filed Vitals:   02/26/16 2100 02/27/16 0500  BP: 138/82 155/78  Pulse: 72 72  Temp: 98.3 F (36.8 C) 98.5 F (36.9 C)  Resp: 18 18    Intake/Output Summary (Last 24 hours) at 02/27/16 1444 Last data filed at 02/27/16 0900  Gross per 24 hour  Intake    360 ml  Output      0 ml  Net    360 ml   There were no vitals filed for this visit.  Exam:   General:   Appears in no acute distress  Cardiovascular- S1-S2 regular  Respiratory:  Clear to auscultation bilaterally  Abdomen:  Soft, nontender, no organomegaly  Musculoskeletal:  No  cyanosis/clubbing/edema of the lower extremities   Data Reviewed: Basic Metabolic Panel:  Recent Labs Lab 02/24/16 1721 02/25/16 0321 02/27/16 0614  NA 132* 132* 134*  K 4.0 4.1 3.7  CL 97* 96* 100*  CO2 26 26 26   GLUCOSE 114* 126* 107*  BUN 26* 25* 15  CREATININE 1.00 0.99 0.78  CALCIUM 8.5* 8.2* 7.9*   Liver Function Tests:  Recent Labs Lab 02/24/16 1721  AST 35  ALT 27  ALKPHOS 79  BILITOT 1.0  PROT 6.7  ALBUMIN 3.2*   CBC:  Recent Labs Lab 02/24/16 1937 02/25/16 0321  WBC 6.3 7.7  NEUTROABS 4.4  --   HGB 13.5 12.9*  HCT 40.5 39.2  MCV 82.8 82.9  PLT 193 221   Cardiac Enzymes:  Recent Labs Lab 02/24/16 1549  TROPONINI <0.03    Studies: US Venous Img Lower Bilateral  02/27/2016  CLINICAL DATA:  History of DVT EXAM: BILATERAL LOWER EXTREMITY VENOUS DUPLEX ULTRASOUND TECHNIQUE: Doppler venous assessment of the bilateral lower extremity deep venous system was performed, including characterization of spectral flow, compressibility, and phasicity. COMPARISON:  11/06/2015 FINDINGS: There is complete compressibility of the right common femoral, femoral, and popliteal veins. Doppler analysis demonstrates respiratory phasicity and augmentation of flow with calf compression. Right calf veins were difficult to visualize due to limitations in patient position. There is complete compressibility of the left common femoral vein. The saphenous femoral junction is patent. Doppler  analysis demonstrates a normal-appearing venous waveform in the structures. There continues to be chronic appearing nonocclusive thrombus along the common femoral vein. The thrombus is nearly occlusive in the popliteal vein. There is some re- cannulization within the popliteal vein. The left posterior tibial vein is thrombosed. IMPRESSION: No evidence of right lower extremity DVT. There is chronic partially occlusive DVT in the left femoral and popliteal veins. It is nearly occlusive in the left  popliteal vein. There may be some increased re- cannulization through the chronic DVT in the left popliteal vein in comparison to the prior study. No definite new DVT in the lower extremities. Electronically Signed   By: Marybelle Killings M.D.   On: 02/27/2016 10:09    Scheduled Meds: . feeding supplement (ENSURE ENLIVE)  237 mL Oral BID BM   Continuous Infusions: . sodium chloride 100 mL/hr at 02/26/16 2137    Principal Problem:   Weakness generalized Active Problems:   Venous (peripheral) insufficiency   Alzheimer's disease   Foul smelling urine   Lung mass    Time spent: 35 min    [place], [place] [facility] Pager [phone]. If 7PM-7AM, please contact night-coverage at [email or web address], password [facility] 02/27/2016, 2:44 PM

## 2016-02-28 DIAGNOSIS — F319 Bipolar disorder, unspecified: Secondary | ICD-10-CM | POA: Diagnosis present

## 2016-02-28 DIAGNOSIS — R531 Weakness: Secondary | ICD-10-CM | POA: Diagnosis not present

## 2016-02-28 DIAGNOSIS — F209 Schizophrenia, unspecified: Secondary | ICD-10-CM | POA: Diagnosis present

## 2016-02-28 DIAGNOSIS — G300 Alzheimer's disease with early onset: Secondary | ICD-10-CM | POA: Diagnosis not present

## 2016-02-28 DIAGNOSIS — R918 Other nonspecific abnormal finding of lung field: Secondary | ICD-10-CM | POA: Diagnosis not present

## 2016-02-28 DIAGNOSIS — G309 Alzheimer's disease, unspecified: Secondary | ICD-10-CM | POA: Diagnosis present

## 2016-02-28 DIAGNOSIS — I872 Venous insufficiency (chronic) (peripheral): Secondary | ICD-10-CM | POA: Diagnosis present

## 2016-02-28 DIAGNOSIS — N390 Urinary tract infection, site not specified: Secondary | ICD-10-CM | POA: Diagnosis present

## 2016-02-28 DIAGNOSIS — M2550 Pain in unspecified joint: Secondary | ICD-10-CM | POA: Diagnosis not present

## 2016-02-28 DIAGNOSIS — Z66 Do not resuscitate: Secondary | ICD-10-CM | POA: Diagnosis present

## 2016-02-28 DIAGNOSIS — F0280 Dementia in other diseases classified elsewhere without behavioral disturbance: Secondary | ICD-10-CM | POA: Diagnosis not present

## 2016-02-28 DIAGNOSIS — E871 Hypo-osmolality and hyponatremia: Secondary | ICD-10-CM | POA: Diagnosis not present

## 2016-02-28 DIAGNOSIS — Z7401 Bed confinement status: Secondary | ICD-10-CM | POA: Diagnosis not present

## 2016-02-28 DIAGNOSIS — Z86718 Personal history of other venous thrombosis and embolism: Secondary | ICD-10-CM | POA: Diagnosis not present

## 2016-02-28 DIAGNOSIS — G629 Polyneuropathy, unspecified: Secondary | ICD-10-CM | POA: Diagnosis present

## 2016-02-28 DIAGNOSIS — E860 Dehydration: Secondary | ICD-10-CM | POA: Diagnosis present

## 2016-02-28 DIAGNOSIS — Z515 Encounter for palliative care: Secondary | ICD-10-CM | POA: Diagnosis not present

## 2016-02-28 DIAGNOSIS — Z7901 Long term (current) use of anticoagulants: Secondary | ICD-10-CM | POA: Diagnosis not present

## 2016-02-28 DIAGNOSIS — Z8701 Personal history of pneumonia (recurrent): Secondary | ICD-10-CM | POA: Diagnosis not present

## 2016-02-28 NOTE — Clinical Social Work Note (Signed)
Clinical Social Work Assessment  Patient Details  Name: [name] MRN: [id number] Date of Birth: 1927-11-16  Date of referral:  02/28/16               Reason for consult:  Facility Placement, Discharge Planning (vs Home with home health)                Permission sought to share information with:  Case Manager, Customer service manager, Family Supports Permission granted to share information::  Yes, Verbal Permission Granted  Name::        Agency::  none  Relationship::  daughter and grandson  Sport and exercise psychologist Information:     Housing/Transportation Living arrangements for the past 2 months:  Single Family Home Source of Information:  Medical Team, Adult Children Patient Interpreter Needed:  None Criminal Activity/Legal Involvement Pertinent to Current Situation/Hospitalization:  No - Comment as needed Significant Relationships:  Adult Children, Warehouse manager, Other Family Members Lives with:  Other (Comment) (grandson) Do you feel safe going back to the place where you live?  Yes (agreeable to home health) Need for family participation in patient care:  Yes (Comment) (memory is declining)  Care giving concerns:  No concerns noted at this time. Patient lives with his grandson and daughter lives right beside the two.  Patient was very mobile before Monday and then needed assistance in walking from family.  Patient is planning to return home with family who provides 24/7 care. HH has been arranged with [facility] per family request.   Social Worker assessment / plan:  LCSW was consulted and spoke with MD to discuss additional option of SNF.  Patient at this time is being recommended HH by PT and unless higher level of care is warranted to recommended, he will return to prior home with family at [place]. PT made aware of patient and concerns by MD for possible SNF. Patient is in observation status and potientally could meet, but with recommendations not changing he will follow up  with [facility].    Daughter in room and fully agreeable to plan.  No barriers at this time.  Employment status:  Retired Health visitor, Managed Care PT Recommendations:  Home with [place] / Referral to community resources:  [place], Other (Comment Required) (home health PT, [facility] services.)  Patient/Family's Response to care:  Agreeable to plan  Patient/Family's Understanding of and Emotional Response to Diagnosis, Current Treatment, and Prognosis:  Patient pleasantly confused and daughter is very aware of treatment plans and prognosis. She questions his mobility issues being from Dementia vs the DVT.  Daughter reasonable and concerned with patient, but hopeful for his baseline to return quickly.  Emotional Assessment Appearance:  Appears stated age Attitude/Demeanor/Rapport:  Other (cooperative, confused and pleasant) Affect (typically observed):  Pleasant, Appropriate Orientation:  Oriented to Self, Oriented to Place Alcohol / Substance use:  Not Applicable Psych involvement (Current and /or in the community):  No (Comment)  Discharge Needs  Concerns to be addressed:  No discharge needs identified Readmission within the last 30 days:  No Current discharge risk:  None Barriers to Discharge:  No Barriers Identified   [place], LCSW 02/28/2016, 11:28 AM

## 2016-02-28 NOTE — Progress Notes (Signed)
PT Cancellation Note  Patient Details Name: [name] MRN: [id number] DOB: [birth year]   Cancelled Treatment:    Reason Eval/Treat Not Completed: Patient not medically ready. Chart reviewed, imaging confirming LLE DVT, with family considering anticoagulation options. Per hospital policy, PT is contraindicated until 5hrs s/p initiation of therapeutic dose of anticoagulation, in the setting of DVT findings. Will continue to follow remotely and resume treatment once appropriate.   8:46 AM, 02/28/2016 Etta Grandchild, PT, DPT PRN Physical Therapist at [place] License # [id number] (wireless)  [phone] (mobile)

## 2016-02-28 NOTE — Progress Notes (Signed)
Physical Therapy Family Consult:  Patient Details Name: [name] MRN: [id number] DOB: 30-Apr-1927   Family Member Consult:     Chart reviewed and care plan discussed with CSW/attending physician. Stopped in room at request of attending to answer questions of family regarding HHPT v STR. I spoke with daughter at bedside for 15 minutes and explained my recommendations for return to home with HHPT. She acknowledges that I do not feel the patient would be able to tolerate high frequency/high volume PT, that his dementia would deem a familiar environment more beneficial, and that his prognosis for return to baseline functional mobility in unclear at this time due to murky etiology. She expresses concern with the abrupt change in disposition/mobility with the patient. She is grateful and agreeable that return to home with HHPT is the most appropriate available choice at this time in the restoration of the patient's prior level of function.    1:24 PM, 02/28/2016 Etta Grandchild, PT, DPT PRN Physical Therapist at [place] License # [id number] (wireless)  [phone] (mobile)

## 2016-02-28 NOTE — Consult Note (Signed)
[facility] Consultation Oncology  Name: [name]      MRN: [id number]    Location: X540/G867-61  Date: 02/28/2016 Time:2:26 PM   REFERRING PHYSICIAN:  Niel Hummer, MD (Hospitalist)  REASON FOR CONSULT:  URGENT: second opinion for anticoagulation, patient with chronic DVT, was diagnosed 3 months ago. son now does not wants eliquis, patient also with lung mass   DIAGNOSIS:  Left LE DVT within left femoral, popliteal, and calf veins diagnosed on [place] on 11/06/2015 after presenting to the ED with a fall and now with a newly discovered left upper lobe pulmonary nodule measuring 1.5 cm in size concerning for bronchogenic carcinoma.  HISTORY OF PRESENT ILLNESS:   [name] is an 82 year old white American with a past medical history significant for for falls and Alzheimer's disease who presents to the [facility] ED on 02/24/2016 with generalized weakness and hematology is consulted for anticoagulation recommendations as patient's son is refusing anticoagulation.  I personally reviewed and went over laboratory results with the patient.  The results are noted within this dictation.  I personally reviewed and went over radiographic studies with the patient.  The results are noted within this dictation.  [place] os left lower leg after 3 months of Eliquis anticoagulation demonstrates chronic partially occlusive DVT in left femoral and popliteal veins with near occlusion in the left popliteal vein and some re-cannulization through the chronic DVT in the left popliteal vein.  Additionally, he has a CT of chest that was completed on 02/24/2016 that demonstrates a LUL nodule measuring 1.5 cm after xray of chest demonstrated a new lung nodule.  Chart is reviewed in detail.  He is a patient of Dr. Wolfgang Phoenix.  I have reviewed Dr. Lance Sell notes and he clearly notes that the patient has moderate to advanced Alzheimer's disease.  The patient is in his hospital bed, not appropriately participatory in  conversation, but his daughter is at the bedside.  He is seen grasping the air with his right and left hand.    His daughter provides the patient's history.    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  He is a never smoker.  His daughter reports that he lives with her and her son who are the primary caregivers of her father and mother.  Both parents have dementia.  She notes that she found him on the tiled floor of the bathroom in December 2016 after he fell going to the restroom.  She notes that they brought him to the ED at which time his DVT was found.  He has been on Eliquis since with good tolerance.  She reports that since December 2016, he has not fallen.  She notes that he has become more bed bound at home.  At home, he complained of left leg pain and she notes that he never complains of pain.  She notes that her brother, [name], is the patient's POA.  He lives on the [place].  She notes that he is not always the most pleasant person to interact with.  She is on disability for bipolar disorder and schizophrenia.  She reports that [name] does not think the Eliquis worked because he has been on the medication for 4 months without resolution of the DVT.    The daughter reports knowing about the the patient's recently found LUL lesion.  She is not interested in pursuing this diagnosis, but defers this decision to  [name], her brother (POA).     PAST MEDICAL HISTORY:   Past Medical History  Diagnosis Date  . Arthritis, hip   . Pneumonia 2002, 2010  . Never smoked any substance   . Mild dementia   . Assistance needed for mobility     walks with walker    ALLERGIES: Allergies  Allergen Reactions  . Ciprofloxacin     Neurologic, pt believes it caused a stroke  . Keflex [Cephalexin] Nausea And Vomiting  . Sulfur Other (See Comments)    Reaction is unknown      MEDICATIONS: I have reviewed the patient's current medications.      PAST SURGICAL HISTORY Past Surgical History  Procedure Laterality Date  . Tonsillectomy      As a child  . Appendectomy      At age 79  . Hip surgery      left    FAMILY HISTORY: No family history on file.  SOCIAL HISTORY:  reports that he has never smoked. He does not have any smokeless tobacco history on file. He reports that he does not drink alcohol or use illicit drugs.  PERFORMANCE STATUS: The patient's performance status is 4 - Bedbound  PHYSICAL EXAM: Most Recent Vital Signs: Blood pressure 157/86, pulse 67, temperature 98 F (36.7 C), temperature source Oral, resp. rate 21, SpO2 98 %. General appearance: alert, cooperative, appears stated age, no distress and daughter at the bedside Head: Normocephalic, without obvious abnormality, atraumatic Eyes: negative findings: lids and lashes normal, conjunctivae and sclerae normal and corneas clear Throat: normal findings: lips normal without lesions, buccal mucosa normal and oropharynx pink & moist without lesions or evidence of thrush Neck: no adenopathy Lungs: clear to auscultation bilaterally and anteriorly Heart: regular rate and rhythm Extremities: venous stasis dermatitis noted and increased heat in L leg compared to the right leg Skin: Skin color, texture, turgor normal. No rashes or lesions Lymph nodes: no cervical and clavicular lymphadenopathy Neurologic: Mental status: alertness: lethargic, orientation: place, affect: redirectable, thought content exhibits tangential connections  LABORATORY DATA:  Results for orders placed or performed during the hospital encounter of 02/24/16 (from the past 48 hour(s))  Basic metabolic panel     Status: Abnormal   Collection Time: 02/27/16  6:14 AM  Result Value Ref Range   Sodium 134 (L) 135 - 145 mmol/L   Potassium 3.7 3.5 - 5.1 mmol/L   Chloride 100 (L) 101 - 111 mmol/L   CO2 26 22 - 32 mmol/L   Glucose, Bld 107 (H) 65 - 99 mg/dL   BUN 15 6 - 20 mg/dL   Creatinine, Ser  0.78 0.61 - 1.24 mg/dL   Calcium 7.9 (L) 8.9 - 10.3 mg/dL   GFR calc non Af Amer >60 >60 mL/min   GFR calc Af Amer >60 >60 mL/min    Comment: (NOTE) The eGFR has been calculated using the CKD EPI equation. This calculation has not been validated in all clinical situations. eGFR's persistently <60 mL/min signify possible Chronic Kidney Disease.    Anion gap 8 5 - 15      RADIOGRAPHY: US Venous Img Lower Bilateral  02/27/2016  CLINICAL DATA:  History of DVT EXAM: BILATERAL LOWER EXTREMITY VENOUS DUPLEX ULTRASOUND TECHNIQUE: Doppler venous assessment of the bilateral lower extremity deep venous system was performed, including characterization of spectral flow, compressibility, and phasicity. COMPARISON:  11/06/2015 FINDINGS: There is complete compressibility of the right common femoral, femoral, and popliteal veins. Doppler analysis demonstrates respiratory phasicity and  augmentation of flow with calf compression. Right calf veins were difficult to visualize due to limitations in patient position. There is complete compressibility of the left common femoral vein. The saphenous femoral junction is patent. Doppler analysis demonstrates a normal-appearing venous waveform in the structures. There continues to be chronic appearing nonocclusive thrombus along the common femoral vein. The thrombus is nearly occlusive in the popliteal vein. There is some re- cannulization within the popliteal vein. The left posterior tibial vein is thrombosed. IMPRESSION: No evidence of right lower extremity DVT. There is chronic partially occlusive DVT in the left femoral and popliteal veins. It is nearly occlusive in the left popliteal vein. There may be some increased re- cannulization through the chronic DVT in the left popliteal vein in comparison to the prior study. No definite new DVT in the lower extremities. Electronically Signed   By: Marybelle Killings M.D.   On: 02/27/2016 10:09       PATHOLOGY:  None    ASSESSMENT/PLAN:   82 year old white American man with a 1.5 cm left upper lobe lung lesion, new, suspicious for bronchogenic carcinoma with a left lower extremity DVT found on 11/06/2015 after falling at home.  Anticoagulated x 4 months with Eliquis and now patient's son, [name] refusing further anticoagulation with Eliquis.  Ultimately, the choice of anticoagulation relies on the patient's plan regarding intervention of LUL lung nodule.  I will call the patient's POA for discussion regarding this: [phone].  Phone call to son, POA, performed at 1535 hours.  No answer.  Voice message left.  I will try again later today.  Second attempt was at 1605 hours.  Third attempt was at 1635 hours.  I was able to get in touch with the patient at 1642 hours.  He reports "I only have one message from you and someone is telling me that you have tried to reach."  I explained that I left one voicemail on the initial phone call and all others I hung-up upon hearing the voice message answering service.    We had a long conversation regarding his father.  Mr. Janoski has a DVT in the setting of a suspicious LUL lung mass. [name] reports "with all due respect" he is not a fan of medicine as they are "poisons that have good side effects, sometimes."  He is not interested in pursuing the patient's lung nodule.  He is educated on the prothrombic nature of malignancy, which at this time is concerning.  He relays a story about cancer in someone who lived to be 80 years old who did not die of the cancer.  I agree that pursing this lung mass is not in the patient's best interest given his poor performance status.  "With all due respect, I am not interested in cancer care because I think it is a legitimate method of murder. I have seen a number of people undergo cancer care who are never the same afterwards and those poisons affect the liver and other internal components."    At this time, I would not recommend  anticoagulation as the risks outweigh the benefit.  He has been falling and he has a history of a few falls.  His Alzheimer's disease is severe.  His performance status is a 4.  He is not a candidate for cancer treatment as a result.  Additionally, I would not recommend further cancer work-up as [name] is adamant that he does not want his father to be subjected to further "poisoning."  I have recommended Hospice.  His son will consider this option.  He is a not a candidate for discharge home, in my opinion, without 24 hours care at home.  I learned after completing my conversation with [name] that the family does have 24 hours care.  I have recommended that [name] is made aware of the discharge planning for the patient as I was not privy to this information prior to calling the patient's son.   I will send a copy of this note to the patient's primary care provider, Dr. Wolfgang Phoenix.  All questions were answered. The patient knows to call the clinic with any problems, questions or concerns. We can certainly see the patient much sooner if necessary.  60 minutes spent in telephone contact with [name] (POA).  Total of 120 minutes spent in total patient encounter time.  Patient and plan discussed with Dr. Ancil Linsey and she is in agreement with the aforementioned.   KEFALAS,THOMAS  02/28/2016 3:29 PM    As Above, patient however seems to be becoming more bedbound with limited mobility. Falling is therefore a lower risk. Any older patient on anticoagulation needs to have active involvement by those around to reduce fall risks.  He certainly has indications for anticoagulation and I would choose eliquis given he was doing fine on it previously. Coumadin would not be optimal given narrow therapeutic window and frequent monitoring. Pradaxa although with an agent for reversal has an increased risk of bleeding over the age of 46.   If family cannot ensure patient's safety and son is adamant he does not want  father on anticoagulation (being aware of risks/benefits) then discontinuing is certainly within their right to do so as son is HCPOA.  Donald Pore MD

## 2016-02-29 LAB — BASIC METABOLIC PANEL
ANION GAP: 7 (ref 5–15)
BUN: 16 mg/dL (ref 6–20)
CALCIUM: 8 mg/dL — AB (ref 8.9–10.3)
CO2: 28 mmol/L (ref 22–32)
CREATININE: 0.86 mg/dL (ref 0.61–1.24)
Chloride: 98 mmol/L — ABNORMAL LOW (ref 101–111)
Glucose, Bld: 144 mg/dL — ABNORMAL HIGH (ref 65–99)
Potassium: 3.8 mmol/L (ref 3.5–5.1)
SODIUM: 133 mmol/L — AB (ref 135–145)

## 2016-02-29 LAB — CBC
HCT: 38.5 % — ABNORMAL LOW (ref 39.0–52.0)
HEMOGLOBIN: 12.6 g/dL — AB (ref 13.0–17.0)
MCH: 27.2 pg (ref 26.0–34.0)
MCHC: 32.7 g/dL (ref 30.0–36.0)
MCV: 83.2 fL (ref 78.0–100.0)
PLATELETS: 252 10*3/uL (ref 150–400)
RBC: 4.63 MIL/uL (ref 4.22–5.81)
RDW: 13.9 % (ref 11.5–15.5)
WBC: 7.8 10*3/uL (ref 4.0–10.5)

## 2016-02-29 MED ORDER — APIXABAN 5 MG PO TABS
2.5000 mg | ORAL_TABLET | Freq: Two times a day (BID) | ORAL | Status: DC
  Administered 2016-02-29 – 2016-03-02 (×5): 2.5 mg via ORAL
  Filled 2016-02-29 (×4): qty 1

## 2016-02-29 NOTE — Evaluation (Signed)
Physical Therapy Evaluation Patient Details Name: [name] MRN: [id number] DOB: [birth year] Today's Date: 02/29/2016   History of Present Illness  82 yo male h/o dementia lives at home with his daughter and grandson usually walks with a walker at home brought in by daughter when he woke up this morning feeling very weak and could not stand. Daughter reports for the last day or so he has been more incontinent of his bladder and the urine is very odorous. No n/v/d. No complaints of pain anywhere now but daughter reports when he urinates he says it burns. He does not have frequent infections. No known fevers. The goal of the family is to keep him at home irregardless if he can ambulate and they are not interested in nursing home placement. In the ED pt cannot get up from bed, and appears dry. Pt referred for admission for his weakness. At his baseline, he does not know who his daughter is half of his days.  Clinical Impression  RN requests that PT see patient today, states that MD really would like patient to be seen today by PT to assist in determining DC location; PT educated RN regarding PT concern in mobilizing patient with active clots not being treated by anti-coagulants, and RN reported MD aware/would still like patient seen. Patient found sidelying in bed with daughter in room, patient sleeping but easily woken however appears somewhat confused this morning- required Mod cues from PT and family to participate in PT. Supine to sit with Mod(A) from PT, Min(A) from family as patient had some posterior lean today however was able to correct this with cues. Sat at EOB perhaps 1-2 minutes before patient stated that he "....had to get back in bed before I go to the bathroom", patient refused standing up with PT today. Sit to supine with Mod(A) by PT, Min(A) by family; Max(A)x2 to reposition patient in bed. Educated daughter regarding impact of dementia on performance with PT in hospital  environment, PT recommendation for SNF/short term rehab given patient's physical performance today; also educated daughter on PT's caution with mobility today given presence of clots without anti-coagulants, as well as benefit of medical staff being present in short term rehab as patient participates more/becomes more mobile in this scenario. Daughter gave verbal agreement and understanding to PT education, reports she is open to SNF however the final decision is up to patient's POA, who is 5 hours away; she does report that if patient does go home, they will have assistance 24/7. Spoke with MD and RN in hallway after evaluation today, educated regarding PT recommendations for potential DC location.     Follow Up Recommendations SNF;Other (comment) (SNF preferable; 24/7 assist and HHPT if family refuses SNF)    Equipment Recommendations  None recommended by PT    Recommendations for Other Services       Precautions / Restrictions Precautions Precautions: Fall;Other (comment) Precaution Comments: active DVTs with no anti-coagulants per POA request  Restrictions Weight Bearing Restrictions: No      Mobility  Bed Mobility Overal bed mobility: Needs Assistance Bed Mobility: Supine to Sit;Sit to Supine     Supine to sit: Mod assist;HOB elevated;+2 for safety/equipment;Min assist (family assisted; PT mod, family Min assist ) Sit to supine: Mod assist;+2 for safety/equipment;Min assist (PT mod, family min assist )   General bed mobility comments: weak, posterior lean   Transfers                 General transfer  comment: patient refuesed to attempt stand today, reporting he needed to lay back down and go to bathroom   Ambulation/Gait         Gait velocity: unable to attempt today       Stairs            Wheelchair Mobility    Modified Rankin (Stroke Patients Only)       Balance   Sitting-balance support: Bilateral upper extremity supported Sitting  balance-Leahy Scale: Good                                       Pertinent Vitals/Pain Pain Assessment: Faces (no facial expresions that would indicate pain ) Pain Score: 0-No pain Faces Pain Scale: No hurt Pain Intervention(s): Monitored during session    Home Living Family/patient expects to be discharged to:: Private residence Living Arrangements: Children;Other relatives Available Help at Discharge: Family Type of Home: House Home Access: Stairs to enter Entrance Stairs-Rails: Right Entrance Stairs-Number of Steps: 2 Home Layout: One level Home Equipment: Walker - 2 wheels;Bedside commode;Cane - single point      Prior Function Level of Independence: Independent with assistive device(s);Needs assistance   Gait / Transfers Assistance Needed: very limited distances in the house, limited by fatigue.   ADL's / Homemaking Assistance Needed: supervision        Hand Dominance        Extremity/Trunk Assessment   Upper Extremity Assessment: Defer to OT evaluation           Lower Extremity Assessment: Generalized weakness      Cervical / Trunk Assessment: Kyphotic  Communication      Cognition Arousal/Alertness: Awake/alert Behavior During Therapy: WFL for tasks assessed/performed Overall Cognitive Status: History of cognitive impairments - at baseline Area of Impairment: Orientation;Attention;Memory;Following commands;Awareness;Safety/judgement Orientation Level: Person                  General Comments General comments (skin integrity, edema, etc.): patient sleeping this morning, easlily awoken but requried mod cues from family to participate in PT; participation limitd by dementia today     Exercises        Assessment/Plan    PT Assessment Patient needs continued PT services  PT Diagnosis Difficulty walking;Abnormality of gait;Generalized weakness   PT Problem List Decreased strength;Decreased coordination;Decreased range of  motion;Decreased activity tolerance;Decreased knowledge of use of DME;Decreased balance;Decreased mobility  PT Treatment Interventions Gait training;Stair training;Functional mobility training;Therapeutic activities;Therapeutic exercise;Patient/family education   PT Goals (Current goals can be found in the Care Plan section) Acute Rehab PT Goals Patient Stated Goal: regain strength and indep mobility.  PT Goal Formulation: With family Time For Goal Achievement: 03/14/16 Potential to Achieve Goals: Fair    Frequency Min 3X/week   Barriers to discharge        Co-evaluation               End of Session   Activity Tolerance: Patient limited by fatigue;Patient tolerated treatment well;No increased pain Patient left: in bed;with call bell/phone within reach;with bed alarm set;with family/visitor present Nurse Communication: Other (comment);Mobility status (MD and RN education regarding mobility status, PT recommendation)    Functional Assessment Tool Used: Based on skilled clincial assessment of mobility and balance sitting at edge of bed  Functional Limitation: Mobility: Walking and moving around Mobility: Walking and Moving Around Current Status [id number]): At least 60 percent but less than 80  percent impaired, limited or restricted Mobility: Walking and Moving Around Goal Status [phone]): At least 40 percent but less than 60 percent impaired, limited or restricted    Time: 1100-1128 PT Time Calculation (min) (ACUTE ONLY): 28 min   Charges:   PT Evaluation $PT Eval High Complexity: 1 Procedure PT Treatments $Self Care/Home Management: 8-22   PT G Codes:   PT G-Codes **NOT FOR INPATIENT CLASS** Functional Assessment Tool Used: Based on skilled clincial assessment of mobility and balance sitting at edge of bed  Functional Limitation: Mobility: Walking and moving around Mobility: Walking and Moving Around Current Status [id number]): At least 60 percent but less than 80 percent  impaired, limited or restricted Mobility: Walking and Moving Around Goal Status [phone]): At least 40 percent but less than 60 percent impaired, limited or restricted    Deniece Ree PT, DPT [phone]

## 2016-02-29 NOTE — Progress Notes (Signed)
Hematology/Oncology Progress Note  There is ongoing confusion concerning whether to anticoagulate patient. Yesterday pm a Kirby Crigler spoke with patient's son who is the patient's HCPOA and resides on the [facility].  The following points are emphasized.  1. There is no perfect anticoagulant. All have a risk of bleeding. Institution of an anticoagulant is done by weighing risks/benefits of anticoagulation in each patient.  Mr. Sermersheim who is now (per daughter's report) minimally ambulatory has DVT of the LLE diagnosed in December with some improvement (and no extension) on repeat imaging on 02/27/2016. He certainly has an indication for anticoagulation.  Patient's son desires now an anticoagulant that is reversible, currently only coumadin (not practical in this patient) or pradaxa (with Praxbind) also higher risk in this patient.  Point to emphasize is that CNS bleeds are not reversible in the sense the son thinks. There again is no guarantee patient will not bleed on anticoagulation.  However given fact he has limited mobility (His mobility has been steadily declining since December), existing DVT, and new pulm nodule (? Etiology)  his risk of clot extension is NOT insignificant. Certainly resuming anticoagulation with previously used Eliquis that he was tolerating is a reasonable choice. If family desires to re-institute anticoagulation would use eliquis.   2. If family chooses to NOT re-institute anticoagulation that is within their right as son is HCPOA.  He has opted to not pursue evaluation of lung nodule.   Therefore I feel best choice for anticoagulation would be eliquis.   Donald Pore MD

## 2016-02-29 NOTE — Progress Notes (Addendum)
Update: patient's RN stopped PT in hallway and reporting that MD has been angry that patient was not formally evaluated by PT yet, wants eval done TODAY. Spoke with RN regarding PT concern about mobilizing patient in presence of clots as he is still not on any anti-coagulants. RN stated that she and MD are aware of this, MD would like eval done today regardless.  ADDENDUM 4/117: MD stopped PT in hallway and explained that she was not angry with PT, she had just really wanted to get the patient seen to determine potential DC. PT apologized, as this had been a misunderstanding. Educated MD and RN regarding patient performance during evaluation today, as well as PT recommendation for short term rehab at this time.   Deniece Ree PT, DPT [phone]

## 2016-02-29 NOTE — Progress Notes (Signed)
Cancellation/Patient Not Seen: Noted imminent DC order and began chart review, however upon further inspection of chart saw that Imminent DC order was from 3/28, only regular order placed for 4/1. Called and spoke to regular acute PT to confirm situation, who confirmed that Imminent DC order was from earlier in week, and that he had already spoken to patient's MD regarding situation/had addressed patient.   Patient not seen due to imminent DC order already having been addressed by regular acute PT earlier in week, and due to  most recent MD order placed as "routine" priority.     Deniece Ree PT, DPT [phone]

## 2016-02-29 NOTE — Progress Notes (Addendum)
CM spoke with doctor regarding patient services.  Doctor was able to contact Education officer, museum.  CM spoke with SW regarding patient services.  CM to contact POA. CM followed up with [place] spoke with [name] and no fax was received for services.  CM faxed orders for processing.  CM spoke with daughter [name] at bedside regarding home care services.  [facility] still choice for home care and prefers to have a nurse name Alfonzo Beers that works for [facility].  CM will give information to place with orders.  Received information for hospital bed.  Informed family for patient to receive PT, RN, Home Health Aide and Education officer, museum.  Sitter information will be provided and family understands because of the insurance the financial part will have to be paid out of pocket.  [facility] does not provide sitter services.  The Sitter services will have to be provided by another company.   Contact# for daughter [name] [phone].  POA Son [name]:  [phone].  Daughter is going to follow up with POA regarding the [facility] services.  Per family, to have wheelchair sent home and someone will be available at home once hospital bed is delivered.  Patient's height and weight for bed: 165 lbs and 10ft 4" height.

## 2016-02-29 NOTE — Progress Notes (Signed)
[facility] PROGRESS NOTE  [name] [id number] DOB: [birth year] DOA: 02/24/2016 PCP: Sallee Lange, MD  Assessment/Plan: 1-Hyponatremia, dehydration.  Continue with IV fluids. Decrease in sodium level, will increase IV fluids to 125 for 4 hour then 100 cc Repeat labs in am.  BUN improved, decreasing.   2-Generalized weakness- likely from dehydration, advanced dementia. Deconditioning, probably underline malignancyI. Patient was given 1 dose of empiric  ceftriaxone but UA was normal.  Urine culture no growth to date,.  Influenza negative.  CT head negative.  TSH normal  and B 12 normal. .   3--Lung mass- no further work up per family.   4-History of left leg DVT- Resume  apixaban Doppler with persistent Blood clot, patient is at increase risk for developing further blood clot if anticoagulation is stop due to bed bound status and now with lung nodule.  Patient son [name], now wants his father to be on anticoagulation. He thought about it and now he wants his father to be back on eliquis, because it has been working, he understand the risk for further blood clot , he thought that there was a reversal agent for eliquis. I clarify that with him, I explain to him that only coumadin and pradaxa has reversal agent. Explain to him that pradaxa is higher risk on his father. He understand risk and benefits.  Patient was resume on eliquis.    Code Status: Full code Family Communication: son , and daughter  Disposition Plan: will keep one more day for IV fluids.    Consultants:  None  Procedures:  None  Antibiotics:   None   HPI/Subjective: Patient alert, pleasantly confused. He smile, denies pain. He was appreciative of the care.   Objective: Filed Vitals:   02/29/16 0532 02/29/16 1413  BP: 154/79 120/57  Pulse: 76 68  Temp: 98.5 F (36.9 C) 98.1 F (36.7 C)  Resp: 20 18    Intake/Output Summary (Last 24 hours) at 02/29/16 1507 Last data filed at  02/28/16 1849  Gross per 24 hour  Intake    240 ml  Output      0 ml  Net    240 ml   There were no vitals filed for this visit.  Exam:   General:   Appears in no acute distress  Cardiovascular- S1-S2 regular  Respiratory:  Clear to auscultation bilaterally  Abdomen:  Soft, nontender, no organomegaly  Musculoskeletal:  No cyanosis/clubbing/edema of the lower extremities   Data Reviewed: Basic Metabolic Panel:  Recent Labs Lab 02/24/16 1721 02/25/16 0321 02/27/16 0614 02/29/16 1258  NA 132* 132* 134* 133*  K 4.0 4.1 3.7 3.8  CL 97* 96* 100* 98*  CO2 26 26 26 28   GLUCOSE 114* 126* 107* 144*  BUN 26* 25* 15 16  CREATININE 1.00 0.99 0.78 0.86  CALCIUM 8.5* 8.2* 7.9* 8.0*   Liver Function Tests:  Recent Labs Lab 02/24/16 1721  AST 35  ALT 27  ALKPHOS 79  BILITOT 1.0  PROT 6.7  ALBUMIN 3.2*   CBC:  Recent Labs Lab 02/24/16 1937 02/25/16 0321 02/29/16 1258  WBC 6.3 7.7 7.8  NEUTROABS 4.4  --   --   HGB 13.5 12.9* 12.6*  HCT 40.5 39.2 38.5*  MCV 82.8 82.9 83.2  PLT 193 221 252   Cardiac Enzymes:  Recent Labs Lab 02/24/16 1549  TROPONINI <0.03    Studies: No results found.  Scheduled Meds: . apixaban  2.5 mg Oral BID  . feeding supplement (  ENSURE ENLIVE)  237 mL Oral BID BM   Continuous Infusions: . sodium chloride 100 mL/hr at 02/28/16 1930    Principal Problem:   Weakness generalized Active Problems:   Venous (peripheral) insufficiency   Alzheimer's disease   Foul smelling urine   Lung mass   Generalized weakness    Time spent: 25 min    [place], [place] [facility] Pager [phone]. If 7PM-7AM, please contact night-coverage at [email or web address], password [facility] 02/29/2016, 3:07 PM  LOS: 1 day

## 2016-03-01 DIAGNOSIS — R29898 Other symptoms and signs involving the musculoskeletal system: Secondary | ICD-10-CM

## 2016-03-01 DIAGNOSIS — E871 Hypo-osmolality and hyponatremia: Secondary | ICD-10-CM

## 2016-03-01 LAB — BASIC METABOLIC PANEL
ANION GAP: 6 (ref 5–15)
BUN: 15 mg/dL (ref 6–20)
CHLORIDE: 102 mmol/L (ref 101–111)
CO2: 26 mmol/L (ref 22–32)
CREATININE: 0.67 mg/dL (ref 0.61–1.24)
Calcium: 7.8 mg/dL — ABNORMAL LOW (ref 8.9–10.3)
GFR calc non Af Amer: >60 mL/min (ref >60)
Glucose, Bld: 124 mg/dL — ABNORMAL HIGH (ref 65–99)
POTASSIUM: 3.6 mmol/L (ref 3.5–5.1)
SODIUM: 134 mmol/L — AB (ref 135–145)

## 2016-03-01 NOTE — Progress Notes (Signed)
CM contacted [place] spoke with representative Chastity to check status of equipment and services.  Confirmed information was received and family was contacted regarding the DME/ hospital bed and wheelchair.  Per family member Collette informed [place] that patient is still in hospital.   [facility] to follow up with her or she will contact them.

## 2016-03-01 NOTE — Progress Notes (Signed)
[facility] PROGRESS NOTE  [name] [id number] DOB: 1927-10-07 DOA: 02/24/2016 PCP: Sallee Lange, MD  Assessment/Plan: 1-Hyponatremia, dehydration.  Improved with IV fluids.  Repeat labs in am.  BUN improved, decreasing.   2-Generalized weakness- likely from dehydration, advanced dementia. Deconditioning, probably underline malignancyI. Patient was given 1 dose of empiric  ceftriaxone but UA was normal.  Urine culture no growth to date,.  Influenza negative.  CT head negative.  TSH normal  and B 12 normal. .  Patient was admitted with generalized weakness, today his weakness is more pronounced left leg. Will get MRI thoracic and lumbar spine.   3--Lung mass- no further work up per family.   4-History of left leg DVT- Continue with  Apixaban. After multiple discussion with patient 's son , eliquis was resume.     Code Status: Full code Family Communication: son , and daughter  Disposition Plan: will do MRI lumbar spine to further evaluate left leg weakness.    Consultants:  Oncology   Procedures:  None  Antibiotics:   None   HPI/Subjective: Patient alert, he is feeling better. He is having trouble lifting left leg, unknown by him for how long. He denies numbness, tingling, back pain.  I spoke with daughter, he has been having some trouble with left leg since diagnosis of clot. Patient has not been complaining of back pain. I will order MRI, she will discussed with her brother if they want the MRI and will let the nurse know.   Objective: Filed Vitals:   02/29/16 1413 03/01/16 0550  BP: 120/57   Pulse: 68 66  Temp: 98.1 F (36.7 C) 98.1 F (36.7 C)  Resp: 18 15    Intake/Output Summary (Last 24 hours) at 03/01/16 [id number] Last data filed at 03/01/16 0200  Gross per 24 hour  Intake 9718.75 ml  Output      0 ml  Net 9718.75 ml   Filed Weights   02/29/16 1600  Weight: 74.844 kg (165 lb)    Exam:   General:   Appears in no acute  distress  Cardiovascular- S1-S2 regular  Respiratory:  Clear to auscultation bilaterally  Abdomen:  Soft, nontender, no organomegaly  Musculoskeletal:  No cyanosis/clubbing/edema of the lower extremities   Neuro; alert, answering questions, speech is clear, upper extremities strength 5/5 right lower extremity 5/5. Left lower extremity 2/5 having difficulty raising left leg unclear if related to pain, or heavy from recent clot.   Data Reviewed: Basic Metabolic Panel:  Recent Labs Lab 02/24/16 1721 02/25/16 0321 02/27/16 0614 02/29/16 1258 03/01/16 0649  NA 132* 132* 134* 133* 134*  K 4.0 4.1 3.7 3.8 3.6  CL 97* 96* 100* 98* 102  CO2 26 26 26 28 26   GLUCOSE 114* 126* 107* 144* 124*  BUN 26* 25* 15 16 15   CREATININE 1.00 0.99 0.78 0.86 0.67  CALCIUM 8.5* 8.2* 7.9* 8.0* 7.8*   Liver Function Tests:  Recent Labs Lab 02/24/16 1721  AST 35  ALT 27  ALKPHOS 79  BILITOT 1.0  PROT 6.7  ALBUMIN 3.2*   CBC:  Recent Labs Lab 02/24/16 1937 02/25/16 0321 02/29/16 1258  WBC 6.3 7.7 7.8  NEUTROABS 4.4  --   --   HGB 13.5 12.9* 12.6*  HCT 40.5 39.2 38.5*  MCV 82.8 82.9 83.2  PLT 193 221 252   Cardiac Enzymes:  Recent Labs Lab 02/24/16 1549  TROPONINI <0.03    Studies: No results found.  Scheduled Meds: . apixaban  2.5 mg Oral BID  . feeding supplement (ENSURE ENLIVE)  237 mL Oral BID BM   Continuous Infusions: . sodium chloride 125 mL/hr at 03/01/16 0200    Principal Problem:   Weakness generalized Active Problems:   Venous (peripheral) insufficiency   Alzheimer's disease   Foul smelling urine   Lung mass   Generalized weakness    Time spent: 25 min    [name], [place] [facility] Pager [phone]. If 7PM-7AM, please contact night-coverage at [email or web address], password [facility] 03/01/2016, 9:09 AM  LOS: 2 days

## 2016-03-02 ENCOUNTER — Encounter (HOSPITAL_COMMUNITY): Payer: Self-pay | Admitting: Primary Care

## 2016-03-02 ENCOUNTER — Inpatient Hospital Stay (HOSPITAL_COMMUNITY): Payer: Medicare Other

## 2016-03-02 DIAGNOSIS — E871 Hypo-osmolality and hyponatremia: Principal | ICD-10-CM

## 2016-03-02 DIAGNOSIS — Z515 Encounter for palliative care: Secondary | ICD-10-CM

## 2016-03-02 DIAGNOSIS — G300 Alzheimer's disease with early onset: Secondary | ICD-10-CM

## 2016-03-02 MED ORDER — PREDNISONE 20 MG PO TABS
30.0000 mg | ORAL_TABLET | Freq: Every day | ORAL | Status: DC
  Administered 2016-03-02: 30 mg via ORAL
  Filled 2016-03-02: qty 1

## 2016-03-02 MED ORDER — ENSURE ENLIVE PO LIQD: 237.0000 mL | Freq: Two times a day (BID) | ORAL | Status: DC

## 2016-03-02 MED ORDER — PREDNISONE 10 MG PO TABS: 30.0000 mg | ORAL_TABLET | Freq: Every day | ORAL | Status: DC

## 2016-03-02 NOTE — Progress Notes (Signed)
Patient's IV removed.  Site WNL.  AVS reviewed with patient's daughter over the the telephone.  Verbalized understanding of discharge instructions, physician follow-up, medications.  Patient's daughter stated hospital bed to soon be delivered this evening.  Patient's daughter asked that EMS transport patient home.  EMS arranged to pickup patient between 2000-2100. Patient's daughter aware and stated this was a good time.  Packet at desk and ready for EMS pickup with prescription and AVS.

## 2016-03-02 NOTE — Care Management Note (Addendum)
Case Management Note  Patient Details  Name: [name] MRN: [id number] Date of Birth: 1927/10/28  Subjective/Objective:      Spoke with daughter colette concerning discharge plan, Discussed SNF and the requirements for  Participation in rehabilitation activities. Patient does have dementia which would present a problem for rehabilitation. Discussed that patient would need to be able to participate in therapy and recall goals. At this time patient is not able to participate.  Daughter agrees. After discussing options daughter agrees that patient would be able to come home with [facility] services PT RN OT Aid and CSW.  Daughter stated that her and her son would be able to assist patient with transfers and provide 24 hour assistance. Daughter has Advertising copywriter and stated that she will be getting a service to help her and the son. They are taking care of the patients spouse who lives in the home as well. Wheelchair and hospital bed have been ordered for the patient to be delivered. Today prior to discharge.   PT will need EMS transport home.  Spoke also with the son [name] who is the POA concerning home health and explained why patient would not qualify for SNF. Son is agreeable to plan. Patient to go home with home health and social work to follow. Patient open to [place] prior to admission. Case discussed with Dr Tyrell Antonio atttending CSW Vidal Schwalbe and also with Rebbeca Paul PT.    Action/Plan:  Home with home health services.  Expected Discharge Date:                  Expected Discharge Plan:  [place]  In-House Referral:  NA  Discharge planning Services  CM Consult  Post Acute Care Choice:  Home Health Choice offered to:  Adult Children  DME Arranged:    DME Agency:     HH Arranged:    HH Agency:  [facility]  Status of Service:  Completed, signed off  Medicare Important Message Given:    Date Medicare IM Given:    Medicare IM give by:    Date  Additional Medicare IM Given:    Additional Medicare Important Message give by:     If discussed at [place] of Stay Meetings, dates discussed:    Additional Comments:  Alvie Heidelberg, RN 03/02/2016, 2:14 PM

## 2016-03-02 NOTE — Consult Note (Signed)
Consultation Note Date: 03/02/2016   Patient Name: [name]  DOB: [birth year]  MRN: [id number]  Age / Sex: 82 y.o., male  PCP: Kathyrn Drown, MD Referring Physician: Elmarie Shiley, MD  Reason for Consultation: Disposition, Establishing goals of care and Psychosocial/spiritual support    Clinical Assessment/Narrative: Mr. Mcbeth is resting quietly in the bed, his daughter is at bedside.  He does not respond when I call his name. Daughter [name] shares that her goal is to take him home with home health services.  [facility] and have a relationship with the service provider. She shares that both she and her son will provide 24 hour daycare until they set up private pay 24 hour day caregivers. She denies questions or concerns. I encouraged her to reach out if/when returned to the hospital. He is expected to discharge saying.  Contacts/Participants in Discussion:  Daughter [name] Primary Decision Maker: Daughter [name]   Relationship to Patient Daughter [name] HCPOA: yes  Daughter [name]  SUMMARY OF RECOMMENDATIONS  Code Status/[facility] Care Planning: DNR    Code Status Orders        Start     Ordered   02/25/16 0159  Full code   Continuous     02/25/16 0158    Code Status History    Date Active Date Inactive Code Status Order ID Comments User Context   07/28/2014  8:52 PM 07/31/2014  1:50 PM Full Code [id number]  Truett Mainland, DO Inpatient      Other Directives:None  Symptom Management:   per hospitalist.  Palliative Prophylaxis:   Aspiration, Frequent Pain Assessment and Turn Reposition  Additional Recommendations (Limitations, Scope, Preferences):  Treat the treatable at this point  Psycho-social/Spiritual:  Support System: Strong Desire for further Chaplaincy support:yes Additional Recommendations: Caregiving   Support/Resources, Education on Hospice and Grief/Bereavement Support  Prognosis: < 3 months likely based on current health status.  Discharge Planning: Home with Home Health   Chief Complaint/ Primary Diagnoses: Present on Admission:  . Alzheimer's disease . Venous (peripheral) insufficiency . Foul smelling urine . Lung mass  I have reviewed the medical record, interviewed the patient and family, and examined the patient. The following aspects are pertinent.  Past Medical History  Diagnosis Date  . Arthritis, hip   . Pneumonia 2002, 2010  . Never smoked any substance   . Mild dementia   . Assistance needed for mobility     walks with walker   Social History   Social History  . Marital Status: Married    Spouse Name: N/A  . Number of Children: N/A  . Years of Education: N/A   Occupational History  . Retired Company secretary    Social History Main Topics  . Smoking status: Never Smoker   . Smokeless tobacco: None  . Alcohol Use: No  . Drug Use: No  . Sexual Activity: Not Asked   Other Topics Concern  . None   Social History Narrative   No regular exercise   History reviewed. No pertinent family history. Scheduled Meds: . apixaban  2.5 mg Oral BID  . feeding supplement (ENSURE ENLIVE)  237 mL Oral BID BM  . predniSONE  30 mg Oral Q breakfast   Continuous Infusions: . sodium chloride 75 mL/hr at 03/02/16 1007   PRN Meds:.ondansetron **OR** ondansetron (ZOFRAN) IV Medications Prior to Admission:  Prior to Admission medications   Medication Sig Start Date End Date  Taking? Authorizing Provider  apixaban (ELIQUIS) 2.5 MG TABS tablet Take 1 tablet (2.5 mg total) by mouth 2 (two) times daily. 12/24/15  Yes Kathyrn Drown, MD  Emollient [facility] EX) Apply 1 application topically at bedtime.   Yes Historical Provider, MD  feeding supplement, ENSURE ENLIVE, (ENSURE ENLIVE) LIQD Take 237 mLs by mouth 2 (two) times daily between meals. 03/02/16   Belkys A Regalado, MD    predniSONE (DELTASONE) 10 MG tablet Take 3 tablets (30 mg total) by mouth daily with breakfast. 03/02/16   Elmarie Shiley, MD   Allergies  Allergen Reactions  . Ciprofloxacin     Neurologic, pt believes it caused a stroke  . Keflex [Cephalexin] Nausea And Vomiting  . Sulfur Other (See Comments)    Reaction is unknown    Review of Systems  Unable to perform ROS: Dementia    Physical Exam  Nursing note and vitals reviewed. Constitutional: No distress.  Respiratory: No respiratory distress.  Neurological:  Lethargic, does not open eyes.  Skin: Skin is warm and dry.    Vital Signs: BP 119/68 mmHg  Pulse 73  Temp(Src) 98 F (36.7 C) (Oral)  Resp 20  Ht 6\' 4"  (1.93 m)  Wt 74.844 kg (165 lb)  BMI 20.09 kg/m2  SpO2 97%  SpO2: SpO2: 97 % O2 Device:SpO2: 97 % O2 Flow Rate: .   IO: Intake/output summary:  Intake/Output Summary (Last 24 hours) at 03/02/16 1608 Last data filed at 03/01/16 1833  Gross per 24 hour  Intake    240 ml  Output      0 ml  Net    240 ml    LBM: Last BM Date: 03/01/16 Baseline Weight: Weight: 74.844 kg (165 lb) Most recent weight: Weight: 74.844 kg (165 lb)      Palliative Assessment/Data:    Additional Data Reviewed:  CBC:    Component Value Date/Time   WBC 7.8 02/29/2016 1258   HGB 12.6* 02/29/2016 1258   HCT 38.5* 02/29/2016 1258   PLT 252 02/29/2016 1258   MCV 83.2 02/29/2016 1258   NEUTROABS 4.4 02/24/2016 1937   LYMPHSABS 0.6* 02/24/2016 1937   MONOABS 1.3* 02/24/2016 1937   EOSABS 0.1 02/24/2016 1937   BASOSABS 0.0 02/24/2016 1937   Comprehensive Metabolic Panel:    Component Value Date/Time   NA 134* 03/01/2016 0649   K 3.6 03/01/2016 0649   CL 102 03/01/2016 0649   CO2 26 03/01/2016 0649   BUN 15 03/01/2016 0649   CREATININE 0.67 03/01/2016 0649   CREATININE 1.09 12/25/2014 1538   GLUCOSE 124* 03/01/2016 0649   CALCIUM 7.8* 03/01/2016 0649   AST 35 02/24/2016 1721   ALT 27 02/24/2016 1721   ALKPHOS 79  02/24/2016 1721   BILITOT 1.0 02/24/2016 1721   PROT 6.7 02/24/2016 1721   ALBUMIN 3.2* 02/24/2016 1721     Time In: 1400 Time Out: 1430 Time Total: 30 minutes Greater than 50%  of this time was spent counseling and coordinating care related to the above assessment and plan.  Signed by: Drue Novel, NP  Drue Novel, NP  03/02/2016, 4:08 PM  Please contact Palliative Medicine Team phone at [phone] for questions and concerns.

## 2016-03-02 NOTE — Progress Notes (Signed)
EMS here to transport patient home, patient received bath, vital signs stable, patient oriented to self only.

## 2016-03-02 NOTE — Care Management Important Message (Signed)
Important Message  Patient Details  Name: [name] MRN: [id number] Date of Birth: 26-Oct-1927   Medicare Important Message Given:  Yes    Alvie Heidelberg, RN 03/02/2016, 3:10 PM

## 2016-03-02 NOTE — Care Management (Addendum)
Received call from [facility] (daughter) stating that Bed has not been delivered. Contacted Romualdo Bolk at [facility] about the delay.  She checked on this and stated that they are "calling her now".

## 2016-03-02 NOTE — Discharge Summary (Addendum)
Physician Discharge Summary  [name] [id number] DOB: May 18, 1927 DOA: 02/24/2016  PCP: Sallee Lange, MD  Admit date: 02/24/2016 Discharge date: 03/02/2016  Time spent: 35 minutes  Recommendations for Outpatient Follow-up:  Needs PT.  encourage oral intake.   Discharge Diagnoses:    Hyponatremia   Weakness generalized   Femoral neuropathy.    Venous (peripheral) insufficiency   Alzheimer's disease   Foul smelling urine   Lung mass   Generalized weakness    Left leg weakness   Discharge Condition: Stable  Diet recommendation: Heart healthy   Filed Weights   02/29/16 1600  Weight: 74.844 kg (165 lb)    History of present illness:  82 yo male h/o dementia lives at home with his daughter and grandson usually walks with a walker at home brought in by daughter when he woke up this morning feeling very weak and could not stand. Daughter reports for the last day or so he has been more incontinent of his bladder and the urine is very odorous. No n/v/d. No complaints of pain anywhere now but daughter reports when he urinates he says it burns. He does not have frequent infections. No known fevers. The goal of the family is to keep him at home irregardless if he can ambulate and they are not interested in nursing home placement. In the ED pt cannot get up from bed, and appears dry. Pt referred for admission for his weakness. At his baseline, he does not know who his daughter is half of his days.  Hospital Course:  1-Hyponatremia, dehydration.  Improved with IV fluids.  Repeat labs in am.  BUN improved, decreasing.   2-Generalized weakness- Likely from dehydration, advanced dementia. Deconditioning, probably underline malignancyI. Patient was given 1 dose of empiric ceftriaxone but UA was normal.  Urine culture no growth to date,.  Influenza negative.  CT head negative.  TSH normal and B 12 normal. .   Left Leg Weakness: multifactorial, maybe femoral  neuropathy, Chronic DVT, ... Patient was admitted with generalized weakness,  his weakness is more pronounced left leg.  MRI with mild L 4-5 stenosis, could affect L 4 nerve. Discussed case with neurosurgeon on call. Very mild stenosis. No surgery indicated. He recommends PT, treatment of DVT and could use prednisone short course.  I will start low dose prednisone.  Discussed case with PT , [place], he doesn't think that patient will be able to tolerates high intensity of rehab. Also CM discussed with POA, family wants patient to be discharge to home with Home health services.   3--Lung mass- no further work up per family.   4-History of left leg DVT- Continue with Apixaban. After multiple discussion with patient 's son , eliquis was resume.    Procedures:  Doppler.   Consultations: Oncology. Phone call with neurology   Discharge Exam: Filed Vitals:   03/01/16 2215 03/02/16 0600  BP: 126/72 132/76  Pulse:    Temp: 98.3 F (36.8 C) 98.4 F (36.9 C)  Resp: 18 20    General: NAD Cardiovascular: S 1, S 2 RRR Respiratory: CTA  Discharge Instructions   Discharge Instructions    Diet - low sodium heart healthy    Complete by:  As directed      Increase activity slowly    Complete by:  As directed           Current Discharge Medication List    START taking these medications   Details  feeding supplement, ENSURE ENLIVE, (ENSURE  ENLIVE) LIQD Take 237 mLs by mouth 2 (two) times daily between meals. Qty: 237 mL, Refills: 12    predniSONE (DELTASONE) 10 MG tablet Take 3 tablets (30 mg total) by mouth daily with breakfast. Qty: 18 tablet, Refills: 0      CONTINUE these medications which have NOT CHANGED   Details  apixaban (ELIQUIS) 2.5 MG TABS tablet Take 1 tablet (2.5 mg total) by mouth 2 (two) times daily. Qty: 60 tablet, Refills: 0    Emollient (DERMAPHOR EX) Apply 1 application topically at bedtime.       Allergies  Allergen Reactions  . Ciprofloxacin      Neurologic, pt believes it caused a stroke  . Keflex [Cephalexin] Nausea And Vomiting  . Sulfur Other (See Comments)    Reaction is unknown   Follow-up Information    Follow up with Sallee Lange, MD.   Specialty:  Family Medicine   Why:  Call office for appointment   Contact information:   [place] [id number] [phone]        The results of significant diagnostics from this hospitalization (including imaging, microbiology, ancillary and laboratory) are listed below for reference.    Significant Diagnostic Studies: Dg Chest 2 View  02/24/2016  CLINICAL DATA:  Daughter states that he was lethargic and a little confused this morning and unable to ambulate on his own. This is not his norm. EXAM: CHEST - 2 VIEW COMPARISON:  11/01/2015 FINDINGS: Chronic linear scarring in the left upper lobe. 13 mm nodular opacity projecting over the posterior aspect left fifth rib was not evident on films from 08/12/2014. No focal airspace disease or overt edema. Heart size normal. No effusion. No pneumothorax. Minimal spurring in the mid thoracic spine. IMPRESSION: 1. New left upper lobe pulmonary nodule. CT chest would be useful for further characterization. Electronically Signed   By: Lucrezia Europe M.D.   On: 02/24/2016 16:35   Ct Head Wo Contrast  02/24/2016  CLINICAL DATA:  Increased weakness today. No known injury. Dementia. EXAM: CT HEAD WITHOUT CONTRAST TECHNIQUE: Contiguous axial images were obtained from the base of the skull through the vertex without intravenous contrast. COMPARISON:  Head CT 11/01/2015 and 12/26/2014. FINDINGS: Examination is mildly motion degraded. Brain: There is no evidence of acute intracranial hemorrhage, mass lesion, brain edema or extra-axial fluid collection. The ventricles and subarachnoid spaces are prominent but stable. Patchy and confluent periventricular and subcortical white matter disease appears unchanged. No evidence of acute infarct.  Bones/sinuses/visualized face: The visualized paranasal sinuses, mastoid air cells and middle ears are clear. The calvarium is intact. IMPRESSION: Stable atrophy and chronic small vessel ischemic changes. No acute intracranial findings. Electronically Signed   By: Richardean Sale M.D.   On: 02/24/2016 17:00   Ct Chest Wo Contrast  02/24/2016  CLINICAL DATA:  Questionable new pulmonary nodule in the left upper lung on chest radiograph from earlier today. EXAM: CT CHEST WITHOUT CONTRAST TECHNIQUE: Multidetector CT imaging of the chest was performed following the standard protocol without IV contrast. COMPARISON:  Chest radiograph from earlier today. 05/15/2009 chest CT. FINDINGS: Mediastinum/Nodes: Normal heart size. Stable trace pericardial fluid/ thickening. Three-vessel coronary atherosclerosis. Great vessels are normal in course and caliber. Normal visualized thyroid. Normal esophagus. No pathologically enlarged axillary, mediastinal or gross hilar lymph nodes, noting limited sensitivity for the detection of hilar adenopathy on this noncontrast study. Lungs/Pleura: No pneumothorax. No pleural effusion. Stable tiny calcified granulomas in the right upper, right middle and right  lower lobes. There is a new irregular 1.5 x 1.0 cm peripheral left upper lobe pulmonary nodule (series 3/ image 23). No acute consolidative airspace disease or additional significant pulmonary nodules. Stable mild biapical pleural-parenchymal scarring. Upper abdomen: Unremarkable. Musculoskeletal: No aggressive appearing focal osseous lesions. Marked degenerative changes in the thoracic spine. IMPRESSION: 1. New irregular 1.5 x 1.0 cm peripheral left upper lobe pulmonary nodule, for which a primary bronchogenic carcinoma cannot be excluded. Recommend further evaluation with PET-CT. 2. No thoracic adenopathy. 3. Three-vessel coronary atherosclerosis. Electronically Signed   By: Ilona Sorrel M.D.   On: 02/24/2016 18:03   Mr Thoracic Spine  Wo Contrast  03/02/2016  CLINICAL DATA:  Mid and low back pain. Bilateral lower extremity pain. The patient is unable to walk. Initial encounter. No known injury. EXAM: MRI THORACIC SPINE WITHOUT CONTRAST TECHNIQUE: Multiplanar, multisequence MR imaging of the thoracic spine was performed. No intravenous contrast was administered. COMPARISON:  CT chest 02/24/2016. FINDINGS: Vertebral body height and alignment are maintained. Marrow signal is unremarkable. Changes of DISH are noted. Mild convex right curvature seen. The thoracic cord demonstrates normal signal. There is mild multilevel degenerative disc disease most notable at T6-7 where there is a shallow right paracentral protrusion. The central canal and foramina are open at all levels. There small bilateral pleural effusions, larger on the left. IMPRESSION: No finding to explain the patient's symptoms. Mild spondylosis without central canal or foraminal narrowing. Small bilateral pleural effusions, larger on the left. Electronically Signed   By: Inge Rise M.D.   On: 03/02/2016 10:04   Mr Lumbar Spine Wo Contrast  03/02/2016  CLINICAL DATA:  Thoracic and lumbar back pain. Bilateral leg pain. Left leg weakness. EXAM: MRI LUMBAR SPINE WITHOUT CONTRAST TECHNIQUE: Multiplanar, multisequence MR imaging of the lumbar spine was performed. No intravenous contrast was administered. COMPARISON:  Lumbar radiographs dated 05/10/2009 and CT scan of the abdomen dated 05/26/2005 FINDINGS: Normal conus tip at L1. Normal paraspinal soft tissues. Sigmoid diverticulosis. T11-12 and T12-L1: Negative. L1-2: Tiny broad-based disc bulge with no neural impingement. Schmorl's node in the inferior endplate of L1. L2-3: Disc space narrowing. Tiny broad-based disc bulge with no neural impingement. Tiny Schmorl's node in the inferior endplate of L2. Minimal degenerative changes of the facet joints. Right extra foraminal disc bulge. L3-4: Disc space narrowing. Tiny broad-based  endplate osteophytes asymmetric to the left with left lateral recess narrowing which could affect the left L4 nerve, best seen on image 26 of series 3. Slight hypertrophy of the left facet joint and ligamentum flavum. No disc bulging or protrusion. L4-5: Disc space narrowing. Minimal broad-based endplate osteophytes slightly indent the thecal sac. Hypertrophy of the ligamentum flavum and facet joints without neural impingement. L5-S1: Disc space narrowing. No disc protrusion or significant bulging. No neural impingement. IMPRESSION: *Diffuse degenerative disc disease throughout the lumbar spine. Left lateral recess stenosis at L3-4 which could affect the left L4 nerve. No other focal neural impingement. Electronically Signed   By: Lorriane Shire M.D.   On: 03/02/2016 10:00   US Venous Img Lower Bilateral  02/27/2016  CLINICAL DATA:  History of DVT EXAM: BILATERAL LOWER EXTREMITY VENOUS DUPLEX ULTRASOUND TECHNIQUE: Doppler venous assessment of the bilateral lower extremity deep venous system was performed, including characterization of spectral flow, compressibility, and phasicity. COMPARISON:  11/06/2015 FINDINGS: There is complete compressibility of the right common femoral, femoral, and popliteal veins. Doppler analysis demonstrates respiratory phasicity and augmentation of flow with calf compression. Right calf veins were difficult to  visualize due to limitations in patient position. There is complete compressibility of the left common femoral vein. The saphenous femoral junction is patent. Doppler analysis demonstrates a normal-appearing venous waveform in the structures. There continues to be chronic appearing nonocclusive thrombus along the common femoral vein. The thrombus is nearly occlusive in the popliteal vein. There is some re- cannulization within the popliteal vein. The left posterior tibial vein is thrombosed. IMPRESSION: No evidence of right lower extremity DVT. There is chronic partially  occlusive DVT in the left femoral and popliteal veins. It is nearly occlusive in the left popliteal vein. There may be some increased re- cannulization through the chronic DVT in the left popliteal vein in comparison to the prior study. No definite new DVT in the lower extremities. Electronically Signed   By: Marybelle Killings M.D.   On: 02/27/2016 10:09    Microbiology: Recent Results (from the past 240 hour(s))  Urine culture     Status: None   Collection Time: 02/24/16  4:50 PM  Result Value Ref Range Status   Specimen Description URINE, CATHETERIZED  Final   Special Requests NONE  Final   Culture   Final    NO GROWTH 1 DAY Performed at [facility]    Report Status 02/26/2016 FINAL  Final     Labs: Basic Metabolic Panel:  Recent Labs Lab 02/24/16 1721 02/25/16 0321 02/27/16 0614 02/29/16 1258 03/01/16 0649  NA 132* 132* 134* 133* 134*  K 4.0 4.1 3.7 3.8 3.6  CL 97* 96* 100* 98* 102  CO2 26 26 26 28 26   GLUCOSE 114* 126* 107* 144* 124*  BUN 26* 25* 15 16 15   CREATININE 1.00 0.99 0.78 0.86 0.67  CALCIUM 8.5* 8.2* 7.9* 8.0* 7.8*   Liver Function Tests:  Recent Labs Lab 02/24/16 1721  AST 35  ALT 27  ALKPHOS 79  BILITOT 1.0  PROT 6.7  ALBUMIN 3.2*   No results for input(s): LIPASE, AMYLASE in the last 168 hours. No results for input(s): AMMONIA in the last 168 hours. CBC:  Recent Labs Lab 02/24/16 1937 02/25/16 0321 02/29/16 1258  WBC 6.3 7.7 7.8  NEUTROABS 4.4  --   --   HGB 13.5 12.9* 12.6*  HCT 40.5 39.2 38.5*  MCV 82.8 82.9 83.2  PLT 193 221 252   Cardiac Enzymes:  Recent Labs Lab 02/24/16 1549  TROPONINI <0.03   BNP: BNP (last 3 results) No results for input(s): BNP in the last 8760 hours.  ProBNP (last 3 results) No results for input(s): PROBNP in the last 8760 hours.  CBG: No results for input(s): GLUCAP in the last 168 hours.     Signed:  Elmarie Shiley MD.  [facility] 03/02/2016, 10:53 AM

## 2016-03-04 ENCOUNTER — Telehealth: Payer: Self-pay | Admitting: Family Medicine

## 2016-03-04 DIAGNOSIS — G309 Alzheimer's disease, unspecified: Secondary | ICD-10-CM | POA: Diagnosis not present

## 2016-03-04 DIAGNOSIS — I872 Venous insufficiency (chronic) (peripheral): Secondary | ICD-10-CM | POA: Diagnosis not present

## 2016-03-04 DIAGNOSIS — M159 Polyosteoarthritis, unspecified: Secondary | ICD-10-CM | POA: Diagnosis not present

## 2016-03-04 DIAGNOSIS — J189 Pneumonia, unspecified organism: Secondary | ICD-10-CM | POA: Diagnosis not present

## 2016-03-04 DIAGNOSIS — L89321 Pressure ulcer of left buttock, stage 1: Secondary | ICD-10-CM | POA: Diagnosis not present

## 2016-03-04 DIAGNOSIS — F0280 Dementia in other diseases classified elsewhere without behavioral disturbance: Secondary | ICD-10-CM | POA: Diagnosis not present

## 2016-03-04 DIAGNOSIS — F329 Major depressive disorder, single episode, unspecified: Secondary | ICD-10-CM | POA: Diagnosis not present

## 2016-03-04 NOTE — Telephone Encounter (Signed)
[facility] 03/04/16 

## 2016-03-04 NOTE — Telephone Encounter (Signed)
May have OT consult via home health Also it is perfectly fine with me if they treat his stage I with what ever protocol they have As for the prednisone I would do 3 per day Wednesday, Thursday Friday then 2 per day for the next few days until it is finished Finally we will arrange with family for myself to do a home visit next week as follow-up

## 2016-03-04 NOTE — Telephone Encounter (Signed)
Spoke with home health health nurse per Dr.Scott LukingMay have OT consult via home health Also it is perfectly fine with me if they treat his stage I with what ever protocol they have As for the prednisone I would do 3 per day Wednesday, Thursday Friday then 2 per day for the next few days until it is finished Finally we will arrange with family for myself to do a home visit next week as follow-up. Home health nurse verbalized understanding.

## 2016-03-04 NOTE — Telephone Encounter (Signed)
Is it okay for OT to come and eval patient?  Also, he has a Stage 1 to his left buttocks and nurse recommends doing a do a derm weekly and PRN if needed if soiled.  Also, the patient was sent home from hospital on prednisone 10 mg 3 tabs by mouth daily for 7 days.  The nurse is questioning these directions because generally it is tapered.

## 2016-03-04 NOTE — Telephone Encounter (Signed)
Please set up for me to do a home visit next Tuesday at 11:30, please talk with the daughter Colette-to make sure that would be fine-note-patient recently in the hospital I believe he would be too weak to be able to follow-up here so therefore I am willing to do a home visit

## 2016-03-05 DIAGNOSIS — M159 Polyosteoarthritis, unspecified: Secondary | ICD-10-CM | POA: Diagnosis not present

## 2016-03-05 DIAGNOSIS — L89321 Pressure ulcer of left buttock, stage 1: Secondary | ICD-10-CM | POA: Diagnosis not present

## 2016-03-05 DIAGNOSIS — F0280 Dementia in other diseases classified elsewhere without behavioral disturbance: Secondary | ICD-10-CM | POA: Diagnosis not present

## 2016-03-05 DIAGNOSIS — G309 Alzheimer's disease, unspecified: Secondary | ICD-10-CM | POA: Diagnosis not present

## 2016-03-05 DIAGNOSIS — I872 Venous insufficiency (chronic) (peripheral): Secondary | ICD-10-CM | POA: Diagnosis not present

## 2016-03-05 DIAGNOSIS — J189 Pneumonia, unspecified organism: Secondary | ICD-10-CM | POA: Diagnosis not present

## 2016-03-05 NOTE — Telephone Encounter (Signed)
[facility] 03/05/16 

## 2016-03-06 DIAGNOSIS — F0280 Dementia in other diseases classified elsewhere without behavioral disturbance: Secondary | ICD-10-CM | POA: Diagnosis not present

## 2016-03-06 DIAGNOSIS — G309 Alzheimer's disease, unspecified: Secondary | ICD-10-CM | POA: Diagnosis not present

## 2016-03-06 DIAGNOSIS — I872 Venous insufficiency (chronic) (peripheral): Secondary | ICD-10-CM | POA: Diagnosis not present

## 2016-03-06 DIAGNOSIS — M159 Polyosteoarthritis, unspecified: Secondary | ICD-10-CM | POA: Diagnosis not present

## 2016-03-06 DIAGNOSIS — L89321 Pressure ulcer of left buttock, stage 1: Secondary | ICD-10-CM | POA: Diagnosis not present

## 2016-03-06 DIAGNOSIS — J189 Pneumonia, unspecified organism: Secondary | ICD-10-CM | POA: Diagnosis not present

## 2016-03-06 NOTE — Telephone Encounter (Signed)
Spoke with patient's daughter and informed patient that Dr.Scott Wolfgang Phoenix would like to do an Home visit with patient on Tuesday April 11th at 11:30. Patient's daughter verbalized understanding.

## 2016-03-10 ENCOUNTER — Ambulatory Visit: Payer: Medicare Other | Admitting: Family Medicine

## 2016-03-10 DIAGNOSIS — M159 Polyosteoarthritis, unspecified: Secondary | ICD-10-CM | POA: Diagnosis not present

## 2016-03-10 DIAGNOSIS — I82523 Chronic embolism and thrombosis of iliac vein, bilateral: Secondary | ICD-10-CM | POA: Diagnosis not present

## 2016-03-10 DIAGNOSIS — G309 Alzheimer's disease, unspecified: Secondary | ICD-10-CM | POA: Diagnosis not present

## 2016-03-10 DIAGNOSIS — J189 Pneumonia, unspecified organism: Secondary | ICD-10-CM | POA: Diagnosis not present

## 2016-03-10 DIAGNOSIS — F0280 Dementia in other diseases classified elsewhere without behavioral disturbance: Secondary | ICD-10-CM | POA: Diagnosis not present

## 2016-03-10 DIAGNOSIS — I872 Venous insufficiency (chronic) (peripheral): Secondary | ICD-10-CM | POA: Diagnosis not present

## 2016-03-10 DIAGNOSIS — L89321 Pressure ulcer of left buttock, stage 1: Secondary | ICD-10-CM | POA: Diagnosis not present

## 2016-03-10 DIAGNOSIS — G301 Alzheimer's disease with late onset: Secondary | ICD-10-CM

## 2016-03-10 MED ORDER — MUPIROCIN 2 % EX OINT: TOPICAL_OINTMENT | CUTANEOUS | Status: DC

## 2016-03-10 NOTE — Progress Notes (Signed)
   Subjective:    Patient ID: [name], male    DOB: 1927/09/20, 82 y.o.   MRN: [id number]  HPI Patient was seen as a face-to-face evaluation at home because of significant combination of weakness recent infection Alzheimer's and recent diagnosis of lung cancer patient also has DVTs is on Eliquis not having any bleeding trouble currently right now the home health comes out seen physical therapy occupational therapy. Family is doing homecare patient is DO NOT RESUSCITATE. Long discussion held with patient and family about current health condition overall stable yet has significant health problems and frailty. 25 minutes spent with family today.   Review of Systems    see above. No fevers recently. No wheezing or difficulty breathing Objective:   Physical Exam Patient looks to feel weak he is able to respond and interact but he does have confusion and short-term memory HEENT appears normal lungs are clear no crackles heart is regular abdomen soft extremities has skin tears noted family is doing a good job taking care of       Assessment & Plan:  Skin tear on legs-Bactroban as directed should gradually heal up  Alzheimer's stable with gradual progression of confusion family doing a good job taking care of the him  DVTs still present see recent hospital no continue Eliquis no bleeding complications risk of bleeding inside the brain if fall occurs was relayed to the daughter.  Lung cancer family desires not to do any intervention or treatment. Supportive measures only.  Home visit was accomplished. Face-to-face evaluation. I believe the patient would benefit from home therapy as well as physical therapy

## 2016-03-11 DIAGNOSIS — M159 Polyosteoarthritis, unspecified: Secondary | ICD-10-CM | POA: Diagnosis not present

## 2016-03-11 DIAGNOSIS — J189 Pneumonia, unspecified organism: Secondary | ICD-10-CM | POA: Diagnosis not present

## 2016-03-11 DIAGNOSIS — I872 Venous insufficiency (chronic) (peripheral): Secondary | ICD-10-CM | POA: Diagnosis not present

## 2016-03-11 DIAGNOSIS — F0280 Dementia in other diseases classified elsewhere without behavioral disturbance: Secondary | ICD-10-CM | POA: Diagnosis not present

## 2016-03-11 DIAGNOSIS — G309 Alzheimer's disease, unspecified: Secondary | ICD-10-CM | POA: Diagnosis not present

## 2016-03-11 DIAGNOSIS — L89321 Pressure ulcer of left buttock, stage 1: Secondary | ICD-10-CM | POA: Diagnosis not present

## 2016-03-11 NOTE — Progress Notes (Signed)
Home visit scheduled Tuesday May 9th at 11:30- Daughter notified

## 2016-03-12 DIAGNOSIS — F0280 Dementia in other diseases classified elsewhere without behavioral disturbance: Secondary | ICD-10-CM | POA: Diagnosis not present

## 2016-03-12 DIAGNOSIS — M159 Polyosteoarthritis, unspecified: Secondary | ICD-10-CM | POA: Diagnosis not present

## 2016-03-12 DIAGNOSIS — L89321 Pressure ulcer of left buttock, stage 1: Secondary | ICD-10-CM | POA: Diagnosis not present

## 2016-03-12 DIAGNOSIS — G309 Alzheimer's disease, unspecified: Secondary | ICD-10-CM | POA: Diagnosis not present

## 2016-03-12 DIAGNOSIS — J189 Pneumonia, unspecified organism: Secondary | ICD-10-CM | POA: Diagnosis not present

## 2016-03-12 DIAGNOSIS — I872 Venous insufficiency (chronic) (peripheral): Secondary | ICD-10-CM | POA: Diagnosis not present

## 2016-03-13 DIAGNOSIS — J189 Pneumonia, unspecified organism: Secondary | ICD-10-CM | POA: Diagnosis not present

## 2016-03-13 DIAGNOSIS — G309 Alzheimer's disease, unspecified: Secondary | ICD-10-CM | POA: Diagnosis not present

## 2016-03-13 DIAGNOSIS — F0280 Dementia in other diseases classified elsewhere without behavioral disturbance: Secondary | ICD-10-CM | POA: Diagnosis not present

## 2016-03-13 DIAGNOSIS — L89321 Pressure ulcer of left buttock, stage 1: Secondary | ICD-10-CM | POA: Diagnosis not present

## 2016-03-13 DIAGNOSIS — M159 Polyosteoarthritis, unspecified: Secondary | ICD-10-CM | POA: Diagnosis not present

## 2016-03-13 DIAGNOSIS — I872 Venous insufficiency (chronic) (peripheral): Secondary | ICD-10-CM | POA: Diagnosis not present

## 2016-03-16 DIAGNOSIS — I872 Venous insufficiency (chronic) (peripheral): Secondary | ICD-10-CM | POA: Diagnosis not present

## 2016-03-16 DIAGNOSIS — F0280 Dementia in other diseases classified elsewhere without behavioral disturbance: Secondary | ICD-10-CM | POA: Diagnosis not present

## 2016-03-16 DIAGNOSIS — J189 Pneumonia, unspecified organism: Secondary | ICD-10-CM | POA: Diagnosis not present

## 2016-03-16 DIAGNOSIS — M159 Polyosteoarthritis, unspecified: Secondary | ICD-10-CM | POA: Diagnosis not present

## 2016-03-16 DIAGNOSIS — L89321 Pressure ulcer of left buttock, stage 1: Secondary | ICD-10-CM | POA: Diagnosis not present

## 2016-03-16 DIAGNOSIS — G309 Alzheimer's disease, unspecified: Secondary | ICD-10-CM | POA: Diagnosis not present

## 2016-03-17 DIAGNOSIS — G309 Alzheimer's disease, unspecified: Secondary | ICD-10-CM | POA: Diagnosis not present

## 2016-03-17 DIAGNOSIS — I872 Venous insufficiency (chronic) (peripheral): Secondary | ICD-10-CM | POA: Diagnosis not present

## 2016-03-17 DIAGNOSIS — J189 Pneumonia, unspecified organism: Secondary | ICD-10-CM | POA: Diagnosis not present

## 2016-03-17 DIAGNOSIS — L89321 Pressure ulcer of left buttock, stage 1: Secondary | ICD-10-CM | POA: Diagnosis not present

## 2016-03-17 DIAGNOSIS — F0280 Dementia in other diseases classified elsewhere without behavioral disturbance: Secondary | ICD-10-CM | POA: Diagnosis not present

## 2016-03-17 DIAGNOSIS — M159 Polyosteoarthritis, unspecified: Secondary | ICD-10-CM | POA: Diagnosis not present

## 2016-03-18 DIAGNOSIS — L89321 Pressure ulcer of left buttock, stage 1: Secondary | ICD-10-CM | POA: Diagnosis not present

## 2016-03-18 DIAGNOSIS — J189 Pneumonia, unspecified organism: Secondary | ICD-10-CM | POA: Diagnosis not present

## 2016-03-18 DIAGNOSIS — I872 Venous insufficiency (chronic) (peripheral): Secondary | ICD-10-CM | POA: Diagnosis not present

## 2016-03-18 DIAGNOSIS — G309 Alzheimer's disease, unspecified: Secondary | ICD-10-CM | POA: Diagnosis not present

## 2016-03-18 DIAGNOSIS — F0280 Dementia in other diseases classified elsewhere without behavioral disturbance: Secondary | ICD-10-CM | POA: Diagnosis not present

## 2016-03-18 DIAGNOSIS — M159 Polyosteoarthritis, unspecified: Secondary | ICD-10-CM | POA: Diagnosis not present

## 2016-03-19 ENCOUNTER — Telehealth: Payer: Self-pay | Admitting: Family Medicine

## 2016-03-19 DIAGNOSIS — L89321 Pressure ulcer of left buttock, stage 1: Secondary | ICD-10-CM | POA: Diagnosis not present

## 2016-03-19 DIAGNOSIS — M159 Polyosteoarthritis, unspecified: Secondary | ICD-10-CM | POA: Diagnosis not present

## 2016-03-19 DIAGNOSIS — G309 Alzheimer's disease, unspecified: Secondary | ICD-10-CM | POA: Diagnosis not present

## 2016-03-19 DIAGNOSIS — F0280 Dementia in other diseases classified elsewhere without behavioral disturbance: Secondary | ICD-10-CM | POA: Diagnosis not present

## 2016-03-19 DIAGNOSIS — J189 Pneumonia, unspecified organism: Secondary | ICD-10-CM | POA: Diagnosis not present

## 2016-03-19 DIAGNOSIS — I872 Venous insufficiency (chronic) (peripheral): Secondary | ICD-10-CM | POA: Diagnosis not present

## 2016-03-19 NOTE — Telephone Encounter (Signed)
Please give verbal orders.

## 2016-03-19 NOTE — Telephone Encounter (Signed)
[facility] is needing verbal orders for ot two times a wk for 4 wks.

## 2016-03-20 NOTE — Telephone Encounter (Signed)
Discussed with KAT at [facility]

## 2016-03-23 DIAGNOSIS — J189 Pneumonia, unspecified organism: Secondary | ICD-10-CM | POA: Diagnosis not present

## 2016-03-23 DIAGNOSIS — I872 Venous insufficiency (chronic) (peripheral): Secondary | ICD-10-CM | POA: Diagnosis not present

## 2016-03-23 DIAGNOSIS — L89321 Pressure ulcer of left buttock, stage 1: Secondary | ICD-10-CM | POA: Diagnosis not present

## 2016-03-23 DIAGNOSIS — G309 Alzheimer's disease, unspecified: Secondary | ICD-10-CM | POA: Diagnosis not present

## 2016-03-23 DIAGNOSIS — M159 Polyosteoarthritis, unspecified: Secondary | ICD-10-CM | POA: Diagnosis not present

## 2016-03-23 DIAGNOSIS — F0280 Dementia in other diseases classified elsewhere without behavioral disturbance: Secondary | ICD-10-CM | POA: Diagnosis not present

## 2016-03-24 DIAGNOSIS — M159 Polyosteoarthritis, unspecified: Secondary | ICD-10-CM | POA: Diagnosis not present

## 2016-03-24 DIAGNOSIS — F0280 Dementia in other diseases classified elsewhere without behavioral disturbance: Secondary | ICD-10-CM | POA: Diagnosis not present

## 2016-03-24 DIAGNOSIS — L89321 Pressure ulcer of left buttock, stage 1: Secondary | ICD-10-CM | POA: Diagnosis not present

## 2016-03-24 DIAGNOSIS — G309 Alzheimer's disease, unspecified: Secondary | ICD-10-CM | POA: Diagnosis not present

## 2016-03-24 DIAGNOSIS — I872 Venous insufficiency (chronic) (peripheral): Secondary | ICD-10-CM | POA: Diagnosis not present

## 2016-03-24 DIAGNOSIS — J189 Pneumonia, unspecified organism: Secondary | ICD-10-CM | POA: Diagnosis not present

## 2016-03-25 DIAGNOSIS — M159 Polyosteoarthritis, unspecified: Secondary | ICD-10-CM | POA: Diagnosis not present

## 2016-03-25 DIAGNOSIS — J189 Pneumonia, unspecified organism: Secondary | ICD-10-CM | POA: Diagnosis not present

## 2016-03-25 DIAGNOSIS — L89321 Pressure ulcer of left buttock, stage 1: Secondary | ICD-10-CM | POA: Diagnosis not present

## 2016-03-25 DIAGNOSIS — G309 Alzheimer's disease, unspecified: Secondary | ICD-10-CM | POA: Diagnosis not present

## 2016-03-25 DIAGNOSIS — I872 Venous insufficiency (chronic) (peripheral): Secondary | ICD-10-CM | POA: Diagnosis not present

## 2016-03-25 DIAGNOSIS — F0280 Dementia in other diseases classified elsewhere without behavioral disturbance: Secondary | ICD-10-CM | POA: Diagnosis not present

## 2016-03-26 DIAGNOSIS — F0280 Dementia in other diseases classified elsewhere without behavioral disturbance: Secondary | ICD-10-CM | POA: Diagnosis not present

## 2016-03-26 DIAGNOSIS — G309 Alzheimer's disease, unspecified: Secondary | ICD-10-CM | POA: Diagnosis not present

## 2016-03-26 DIAGNOSIS — I872 Venous insufficiency (chronic) (peripheral): Secondary | ICD-10-CM | POA: Diagnosis not present

## 2016-03-26 DIAGNOSIS — J189 Pneumonia, unspecified organism: Secondary | ICD-10-CM | POA: Diagnosis not present

## 2016-03-26 DIAGNOSIS — L89321 Pressure ulcer of left buttock, stage 1: Secondary | ICD-10-CM | POA: Diagnosis not present

## 2016-03-26 DIAGNOSIS — M159 Polyosteoarthritis, unspecified: Secondary | ICD-10-CM | POA: Diagnosis not present

## 2016-03-27 DIAGNOSIS — L89321 Pressure ulcer of left buttock, stage 1: Secondary | ICD-10-CM | POA: Diagnosis not present

## 2016-03-27 DIAGNOSIS — F0280 Dementia in other diseases classified elsewhere without behavioral disturbance: Secondary | ICD-10-CM | POA: Diagnosis not present

## 2016-03-27 DIAGNOSIS — J189 Pneumonia, unspecified organism: Secondary | ICD-10-CM | POA: Diagnosis not present

## 2016-03-27 DIAGNOSIS — I872 Venous insufficiency (chronic) (peripheral): Secondary | ICD-10-CM | POA: Diagnosis not present

## 2016-03-27 DIAGNOSIS — M159 Polyosteoarthritis, unspecified: Secondary | ICD-10-CM | POA: Diagnosis not present

## 2016-03-27 DIAGNOSIS — G309 Alzheimer's disease, unspecified: Secondary | ICD-10-CM | POA: Diagnosis not present

## 2016-03-30 DIAGNOSIS — F0280 Dementia in other diseases classified elsewhere without behavioral disturbance: Secondary | ICD-10-CM | POA: Diagnosis not present

## 2016-03-30 DIAGNOSIS — M159 Polyosteoarthritis, unspecified: Secondary | ICD-10-CM | POA: Diagnosis not present

## 2016-03-30 DIAGNOSIS — L89321 Pressure ulcer of left buttock, stage 1: Secondary | ICD-10-CM | POA: Diagnosis not present

## 2016-03-30 DIAGNOSIS — G309 Alzheimer's disease, unspecified: Secondary | ICD-10-CM | POA: Diagnosis not present

## 2016-03-30 DIAGNOSIS — I872 Venous insufficiency (chronic) (peripheral): Secondary | ICD-10-CM | POA: Diagnosis not present

## 2016-03-30 DIAGNOSIS — J189 Pneumonia, unspecified organism: Secondary | ICD-10-CM | POA: Diagnosis not present

## 2016-03-31 DIAGNOSIS — L89321 Pressure ulcer of left buttock, stage 1: Secondary | ICD-10-CM | POA: Diagnosis not present

## 2016-03-31 DIAGNOSIS — I872 Venous insufficiency (chronic) (peripheral): Secondary | ICD-10-CM | POA: Diagnosis not present

## 2016-03-31 DIAGNOSIS — G309 Alzheimer's disease, unspecified: Secondary | ICD-10-CM | POA: Diagnosis not present

## 2016-03-31 DIAGNOSIS — F0280 Dementia in other diseases classified elsewhere without behavioral disturbance: Secondary | ICD-10-CM | POA: Diagnosis not present

## 2016-03-31 DIAGNOSIS — M159 Polyosteoarthritis, unspecified: Secondary | ICD-10-CM | POA: Diagnosis not present

## 2016-03-31 DIAGNOSIS — J189 Pneumonia, unspecified organism: Secondary | ICD-10-CM | POA: Diagnosis not present

## 2016-04-01 DIAGNOSIS — F0280 Dementia in other diseases classified elsewhere without behavioral disturbance: Secondary | ICD-10-CM | POA: Diagnosis not present

## 2016-04-01 DIAGNOSIS — G309 Alzheimer's disease, unspecified: Secondary | ICD-10-CM | POA: Diagnosis not present

## 2016-04-01 DIAGNOSIS — I872 Venous insufficiency (chronic) (peripheral): Secondary | ICD-10-CM | POA: Diagnosis not present

## 2016-04-01 DIAGNOSIS — M159 Polyosteoarthritis, unspecified: Secondary | ICD-10-CM | POA: Diagnosis not present

## 2016-04-01 DIAGNOSIS — J189 Pneumonia, unspecified organism: Secondary | ICD-10-CM | POA: Diagnosis not present

## 2016-04-01 DIAGNOSIS — L89321 Pressure ulcer of left buttock, stage 1: Secondary | ICD-10-CM | POA: Diagnosis not present

## 2016-04-02 ENCOUNTER — Other Ambulatory Visit: Payer: Self-pay | Admitting: Family Medicine

## 2016-04-02 ENCOUNTER — Telehealth: Payer: Self-pay | Admitting: Family Medicine

## 2016-04-02 DIAGNOSIS — J189 Pneumonia, unspecified organism: Secondary | ICD-10-CM | POA: Diagnosis not present

## 2016-04-02 DIAGNOSIS — G309 Alzheimer's disease, unspecified: Secondary | ICD-10-CM | POA: Diagnosis not present

## 2016-04-02 DIAGNOSIS — M159 Polyosteoarthritis, unspecified: Secondary | ICD-10-CM | POA: Diagnosis not present

## 2016-04-02 DIAGNOSIS — F0280 Dementia in other diseases classified elsewhere without behavioral disturbance: Secondary | ICD-10-CM | POA: Diagnosis not present

## 2016-04-02 DIAGNOSIS — L89321 Pressure ulcer of left buttock, stage 1: Secondary | ICD-10-CM | POA: Diagnosis not present

## 2016-04-02 DIAGNOSIS — I872 Venous insufficiency (chronic) (peripheral): Secondary | ICD-10-CM | POA: Diagnosis not present

## 2016-04-02 MED ORDER — CEFPROZIL 500 MG PO TABS: 500.0000 mg | ORAL_TABLET | Freq: Two times a day (BID) | ORAL | Status: DC

## 2016-04-02 NOTE — Telephone Encounter (Signed)
We will go ahead and send then Cefzil 500 mg twice a day 10 days I spoke with the daughter if fevers difficulty breathing or worse immediately go to ER or get rechecked I will be doing a home visit next week

## 2016-04-02 NOTE — Telephone Encounter (Signed)
Home health nurse called stating that he is coughing up green mucous and there is some crackling to right lower lobe. There is no fever. Nurse states they do have the ability to do a portable chest xray tomorrow if needed. Please advise.

## 2016-04-02 NOTE — Telephone Encounter (Signed)
disucssed with home health nurse

## 2016-04-03 DIAGNOSIS — I872 Venous insufficiency (chronic) (peripheral): Secondary | ICD-10-CM | POA: Diagnosis not present

## 2016-04-03 DIAGNOSIS — M159 Polyosteoarthritis, unspecified: Secondary | ICD-10-CM | POA: Diagnosis not present

## 2016-04-03 DIAGNOSIS — F0280 Dementia in other diseases classified elsewhere without behavioral disturbance: Secondary | ICD-10-CM | POA: Diagnosis not present

## 2016-04-03 DIAGNOSIS — G309 Alzheimer's disease, unspecified: Secondary | ICD-10-CM | POA: Diagnosis not present

## 2016-04-03 DIAGNOSIS — L89321 Pressure ulcer of left buttock, stage 1: Secondary | ICD-10-CM | POA: Diagnosis not present

## 2016-04-03 DIAGNOSIS — J189 Pneumonia, unspecified organism: Secondary | ICD-10-CM | POA: Diagnosis not present

## 2016-04-07 ENCOUNTER — Ambulatory Visit: Payer: Medicare Other | Admitting: Family Medicine

## 2016-04-07 DIAGNOSIS — J189 Pneumonia, unspecified organism: Secondary | ICD-10-CM | POA: Diagnosis not present

## 2016-04-07 DIAGNOSIS — B9689 Other specified bacterial agents as the cause of diseases classified elsewhere: Secondary | ICD-10-CM

## 2016-04-07 DIAGNOSIS — F0280 Dementia in other diseases classified elsewhere without behavioral disturbance: Secondary | ICD-10-CM | POA: Diagnosis not present

## 2016-04-07 DIAGNOSIS — L89321 Pressure ulcer of left buttock, stage 1: Secondary | ICD-10-CM | POA: Diagnosis not present

## 2016-04-07 DIAGNOSIS — M159 Polyosteoarthritis, unspecified: Secondary | ICD-10-CM | POA: Diagnosis not present

## 2016-04-07 DIAGNOSIS — J019 Acute sinusitis, unspecified: Secondary | ICD-10-CM

## 2016-04-07 DIAGNOSIS — J209 Acute bronchitis, unspecified: Secondary | ICD-10-CM | POA: Diagnosis not present

## 2016-04-07 DIAGNOSIS — I872 Venous insufficiency (chronic) (peripheral): Secondary | ICD-10-CM | POA: Diagnosis not present

## 2016-04-07 DIAGNOSIS — G309 Alzheimer's disease, unspecified: Secondary | ICD-10-CM | POA: Diagnosis not present

## 2016-04-07 NOTE — Progress Notes (Signed)
   Subjective:    Patient ID: Algis Liming, male    DOB: May 20, 1927, 82 y.o.   MRN: [id number]  HPI This elderly gentleman is homebound. He was having significant chest congestion coughing and was seen by home health nurse who diagnosed him with possible pneumonia he was placed on antibiotics I am seeing him today for follow-up he also has history DVT he is on blood thinner but not having any bleeding side effects in addition to this patient has pedal edema which is chronic   Review of Systems     Objective:   Physical Exam Patient is frail lungs are clear no crackles legs with some edema heart regular       Assessment & Plan:  Continue anticoagulant No bleeding complications I believe pneumonia is resolved Finish out antibiotics Follow-up if any problems We will recheck patient in a proximally 6 weeks

## 2016-04-08 DIAGNOSIS — L89321 Pressure ulcer of left buttock, stage 1: Secondary | ICD-10-CM | POA: Diagnosis not present

## 2016-04-08 DIAGNOSIS — F0280 Dementia in other diseases classified elsewhere without behavioral disturbance: Secondary | ICD-10-CM | POA: Diagnosis not present

## 2016-04-08 DIAGNOSIS — I872 Venous insufficiency (chronic) (peripheral): Secondary | ICD-10-CM | POA: Diagnosis not present

## 2016-04-08 DIAGNOSIS — J189 Pneumonia, unspecified organism: Secondary | ICD-10-CM | POA: Diagnosis not present

## 2016-04-08 DIAGNOSIS — G309 Alzheimer's disease, unspecified: Secondary | ICD-10-CM | POA: Diagnosis not present

## 2016-04-08 DIAGNOSIS — M159 Polyosteoarthritis, unspecified: Secondary | ICD-10-CM | POA: Diagnosis not present

## 2016-04-09 DIAGNOSIS — G309 Alzheimer's disease, unspecified: Secondary | ICD-10-CM | POA: Diagnosis not present

## 2016-04-09 DIAGNOSIS — F0280 Dementia in other diseases classified elsewhere without behavioral disturbance: Secondary | ICD-10-CM | POA: Diagnosis not present

## 2016-04-09 DIAGNOSIS — I872 Venous insufficiency (chronic) (peripheral): Secondary | ICD-10-CM | POA: Diagnosis not present

## 2016-04-09 DIAGNOSIS — L89321 Pressure ulcer of left buttock, stage 1: Secondary | ICD-10-CM | POA: Diagnosis not present

## 2016-04-09 DIAGNOSIS — J189 Pneumonia, unspecified organism: Secondary | ICD-10-CM | POA: Diagnosis not present

## 2016-04-09 DIAGNOSIS — M159 Polyosteoarthritis, unspecified: Secondary | ICD-10-CM | POA: Diagnosis not present

## 2016-04-10 DIAGNOSIS — I872 Venous insufficiency (chronic) (peripheral): Secondary | ICD-10-CM | POA: Diagnosis not present

## 2016-04-10 DIAGNOSIS — J189 Pneumonia, unspecified organism: Secondary | ICD-10-CM | POA: Diagnosis not present

## 2016-04-10 DIAGNOSIS — G309 Alzheimer's disease, unspecified: Secondary | ICD-10-CM | POA: Diagnosis not present

## 2016-04-10 DIAGNOSIS — M159 Polyosteoarthritis, unspecified: Secondary | ICD-10-CM | POA: Diagnosis not present

## 2016-04-10 DIAGNOSIS — L89321 Pressure ulcer of left buttock, stage 1: Secondary | ICD-10-CM | POA: Diagnosis not present

## 2016-04-10 DIAGNOSIS — F0280 Dementia in other diseases classified elsewhere without behavioral disturbance: Secondary | ICD-10-CM | POA: Diagnosis not present

## 2016-04-14 DIAGNOSIS — J189 Pneumonia, unspecified organism: Secondary | ICD-10-CM | POA: Diagnosis not present

## 2016-04-14 DIAGNOSIS — I872 Venous insufficiency (chronic) (peripheral): Secondary | ICD-10-CM | POA: Diagnosis not present

## 2016-04-14 DIAGNOSIS — G309 Alzheimer's disease, unspecified: Secondary | ICD-10-CM | POA: Diagnosis not present

## 2016-04-14 DIAGNOSIS — L89321 Pressure ulcer of left buttock, stage 1: Secondary | ICD-10-CM | POA: Diagnosis not present

## 2016-04-14 DIAGNOSIS — M159 Polyosteoarthritis, unspecified: Secondary | ICD-10-CM | POA: Diagnosis not present

## 2016-04-14 DIAGNOSIS — F0280 Dementia in other diseases classified elsewhere without behavioral disturbance: Secondary | ICD-10-CM | POA: Diagnosis not present

## 2016-04-16 DIAGNOSIS — M159 Polyosteoarthritis, unspecified: Secondary | ICD-10-CM | POA: Diagnosis not present

## 2016-04-16 DIAGNOSIS — J189 Pneumonia, unspecified organism: Secondary | ICD-10-CM | POA: Diagnosis not present

## 2016-04-16 DIAGNOSIS — I872 Venous insufficiency (chronic) (peripheral): Secondary | ICD-10-CM | POA: Diagnosis not present

## 2016-04-16 DIAGNOSIS — L89321 Pressure ulcer of left buttock, stage 1: Secondary | ICD-10-CM | POA: Diagnosis not present

## 2016-04-16 DIAGNOSIS — G309 Alzheimer's disease, unspecified: Secondary | ICD-10-CM | POA: Diagnosis not present

## 2016-04-16 DIAGNOSIS — F0280 Dementia in other diseases classified elsewhere without behavioral disturbance: Secondary | ICD-10-CM | POA: Diagnosis not present

## 2016-04-23 DIAGNOSIS — I872 Venous insufficiency (chronic) (peripheral): Secondary | ICD-10-CM | POA: Diagnosis not present

## 2016-04-23 DIAGNOSIS — J189 Pneumonia, unspecified organism: Secondary | ICD-10-CM | POA: Diagnosis not present

## 2016-04-23 DIAGNOSIS — L89321 Pressure ulcer of left buttock, stage 1: Secondary | ICD-10-CM | POA: Diagnosis not present

## 2016-04-23 DIAGNOSIS — M159 Polyosteoarthritis, unspecified: Secondary | ICD-10-CM | POA: Diagnosis not present

## 2016-04-23 DIAGNOSIS — G309 Alzheimer's disease, unspecified: Secondary | ICD-10-CM | POA: Diagnosis not present

## 2016-04-23 DIAGNOSIS — F0280 Dementia in other diseases classified elsewhere without behavioral disturbance: Secondary | ICD-10-CM | POA: Diagnosis not present

## 2016-04-24 DIAGNOSIS — M159 Polyosteoarthritis, unspecified: Secondary | ICD-10-CM | POA: Diagnosis not present

## 2016-04-24 DIAGNOSIS — I872 Venous insufficiency (chronic) (peripheral): Secondary | ICD-10-CM | POA: Diagnosis not present

## 2016-04-24 DIAGNOSIS — J189 Pneumonia, unspecified organism: Secondary | ICD-10-CM | POA: Diagnosis not present

## 2016-04-24 DIAGNOSIS — G309 Alzheimer's disease, unspecified: Secondary | ICD-10-CM | POA: Diagnosis not present

## 2016-04-24 DIAGNOSIS — L89321 Pressure ulcer of left buttock, stage 1: Secondary | ICD-10-CM | POA: Diagnosis not present

## 2016-04-24 DIAGNOSIS — F0280 Dementia in other diseases classified elsewhere without behavioral disturbance: Secondary | ICD-10-CM | POA: Diagnosis not present

## 2016-04-29 DIAGNOSIS — F0280 Dementia in other diseases classified elsewhere without behavioral disturbance: Secondary | ICD-10-CM | POA: Diagnosis not present

## 2016-04-29 DIAGNOSIS — J189 Pneumonia, unspecified organism: Secondary | ICD-10-CM | POA: Diagnosis not present

## 2016-04-29 DIAGNOSIS — I872 Venous insufficiency (chronic) (peripheral): Secondary | ICD-10-CM | POA: Diagnosis not present

## 2016-04-29 DIAGNOSIS — G309 Alzheimer's disease, unspecified: Secondary | ICD-10-CM | POA: Diagnosis not present

## 2016-04-29 DIAGNOSIS — M159 Polyosteoarthritis, unspecified: Secondary | ICD-10-CM | POA: Diagnosis not present

## 2016-04-29 DIAGNOSIS — L89321 Pressure ulcer of left buttock, stage 1: Secondary | ICD-10-CM | POA: Diagnosis not present

## 2016-05-01 DIAGNOSIS — F0280 Dementia in other diseases classified elsewhere without behavioral disturbance: Secondary | ICD-10-CM | POA: Diagnosis not present

## 2016-05-01 DIAGNOSIS — L89321 Pressure ulcer of left buttock, stage 1: Secondary | ICD-10-CM | POA: Diagnosis not present

## 2016-05-01 DIAGNOSIS — I872 Venous insufficiency (chronic) (peripheral): Secondary | ICD-10-CM | POA: Diagnosis not present

## 2016-05-01 DIAGNOSIS — G309 Alzheimer's disease, unspecified: Secondary | ICD-10-CM | POA: Diagnosis not present

## 2016-05-01 DIAGNOSIS — J189 Pneumonia, unspecified organism: Secondary | ICD-10-CM | POA: Diagnosis not present

## 2016-05-01 DIAGNOSIS — M159 Polyosteoarthritis, unspecified: Secondary | ICD-10-CM | POA: Diagnosis not present

## 2016-06-05 ENCOUNTER — Telehealth (INDEPENDENT_AMBULATORY_CARE_PROVIDER_SITE_OTHER): Payer: Self-pay | Admitting: *Deleted

## 2016-06-05 NOTE — Telephone Encounter (Signed)
Patient on recall for 5 yr TCS, due to age does he need, please advise

## 2016-06-07 NOTE — Telephone Encounter (Signed)
Patient had single adenoma removed on on his last colon. Would not recommend colonoscopy unless he is having symptoms.

## 2016-06-12 ENCOUNTER — Telehealth: Payer: Self-pay | Admitting: Family Medicine

## 2016-06-12 NOTE — Telephone Encounter (Signed)
Colette wants you to know that her husband is taking Mr Alban to renew his  License on Monday. She states that she would like for you to call up there to try  An coax them into denying this renewal. Advised that due to privacy reasons the [facility] May not be able to speak to you about him but that I would pass the message to you.  She says one of her friends doc did this and they denied the license? True or not she wants  you to know.  She wonders if you planned to come visit them at any point in the near future. She states Otherwise there is no big changes.

## 2016-06-15 DIAGNOSIS — C44329 Squamous cell carcinoma of skin of other parts of face: Secondary | ICD-10-CM | POA: Diagnosis not present

## 2016-06-15 DIAGNOSIS — L820 Inflamed seborrheic keratosis: Secondary | ICD-10-CM | POA: Diagnosis not present

## 2016-06-23 ENCOUNTER — Ambulatory Visit: Payer: Medicare Other | Admitting: Family Medicine

## 2016-06-23 DIAGNOSIS — I82432 Acute embolism and thrombosis of left popliteal vein: Secondary | ICD-10-CM | POA: Diagnosis not present

## 2016-06-23 NOTE — Progress Notes (Signed)
Patient ID: [name], male   DOB: 19-Nov-1927, 82 y.o.   MRN: [id number] Home visit, patient is homebound, patient has DVT. Is on Ella Quiros. Not having any bleeding problems. Patient also starting to have cognitive dysfunction, patient also has difficult time with motor skills walks with a cane has not had any falls recently has trouble with pedal edema patient is impaired mobility. Blood pressure is good 120/70. Lungs are clear hearts regular extremities some edema Patient will need to continue Eliquis into late September. We will follow-up patient in 2 months. Afterwards there is a risk of blood clot could redevelop. We will discuss this further with family at the next office visit in 2 months

## 2016-07-06 NOTE — Telephone Encounter (Signed)
It should be noted that I have informed the patient as well as his daughter that he should not drive. I have done this over several different visits over the past year

## 2016-07-28 ENCOUNTER — Telehealth: Payer: Self-pay | Admitting: Family Medicine

## 2016-07-28 NOTE — Telephone Encounter (Signed)
Requesting DNR to be completed.  The one that was filled out before was misplaced in their home.

## 2016-07-28 NOTE — Telephone Encounter (Signed)
Please fill out yellow DO NOT RESUSCITATE form thank you, I will sign

## 2016-07-29 NOTE — Telephone Encounter (Signed)
The form was filled out. Please clarify with the daughter that the brother is also on board with this order-I thought that he was power of medical attorney? Thank you

## 2016-07-29 NOTE — Telephone Encounter (Signed)
Spoke with daughter who states her brother is on board with this decision and it is also stated in the will and was the wish of her parents when they were in their right mind.

## 2016-07-29 NOTE — Telephone Encounter (Signed)
Form in yellow basket

## 2016-08-18 ENCOUNTER — Other Ambulatory Visit: Payer: Self-pay | Admitting: Family Medicine

## 2016-08-27 ENCOUNTER — Ambulatory Visit: Payer: Medicare Other | Admitting: Family Medicine

## 2016-08-27 VITALS — BP 118/70

## 2016-08-27 DIAGNOSIS — I872 Venous insufficiency (chronic) (peripheral): Secondary | ICD-10-CM | POA: Diagnosis not present

## 2016-08-27 DIAGNOSIS — G301 Alzheimer's disease with late onset: Secondary | ICD-10-CM

## 2016-08-27 DIAGNOSIS — F0280 Dementia in other diseases classified elsewhere without behavioral disturbance: Secondary | ICD-10-CM | POA: Diagnosis not present

## 2016-08-28 ENCOUNTER — Encounter: Payer: Self-pay | Admitting: Family Medicine

## 2016-08-28 NOTE — Progress Notes (Signed)
Patient was seen for home visit. Overall the patient is doing a good job watching how he gets around. Family keeps his legs propped up when he is at rest. He is on all his medicines including anticoagulant. Not having any bleeding issues. Overall activity level has been acceptable. Patient does have mild dementia but he is still able to converse and interact appropriately no behavioral issues. Patient also has history of cellulitis of the legs and pneumonia on exam his lungs clear hearts regular pulse normal extremities no edema skin warm dry DVT under good control continue current measures venous insufficiency stable Alzheimer's disease/dementia yeah-stable. No lab work indicated currently.

## 2016-10-30 ENCOUNTER — Telehealth: Payer: Self-pay | Admitting: Family Medicine

## 2016-10-30 NOTE — Telephone Encounter (Signed)
Patient's Daughter called stating that patient is having episodes of diarrhea today with abdominal cramping. Denies fever. States patient is been in bed which is not like him.  Has had 3-4 loose stools. He is currently eating and drinking normally. Patient's daughter was requesting Dr.Scott to make a house call as he usually does but I informed her that Dr.Scott is out of office today. Please advise?  [place]

## 2016-11-01 NOTE — Telephone Encounter (Signed)
Please inform family I can do a home visit but it will not be able to be on Monday due to scheduling reasons. With a like for this home visit to be this week or next week? They may use OTC Imodium for diarrhea. If he starts running fever or bloody stools this is an obvious different situation that needs attending to ASAP

## 2016-11-02 NOTE — Telephone Encounter (Signed)
Left message on voicemail to return call.

## 2016-11-02 NOTE — Telephone Encounter (Signed)
Spoke with patient and informed her per Dr.Scott Luking- family I can do a home visit but it will not be able to be on Monday due to scheduling reasons. With a like for this home visit to be this week or next week?  May use OTC Imodium for diarrhea. If he starts running fever or bloody stools this is an obvious different situation that needs attending to ASAP. Patient daughter verbalized understanding.

## 2016-11-12 ENCOUNTER — Ambulatory Visit: Payer: Medicare Other | Admitting: Family Medicine

## 2016-11-12 DIAGNOSIS — Z23 Encounter for immunization: Secondary | ICD-10-CM | POA: Diagnosis not present

## 2016-11-12 DIAGNOSIS — F0280 Dementia in other diseases classified elsewhere without behavioral disturbance: Secondary | ICD-10-CM | POA: Diagnosis not present

## 2016-11-12 DIAGNOSIS — I82523 Chronic embolism and thrombosis of iliac vein, bilateral: Secondary | ICD-10-CM

## 2016-11-12 DIAGNOSIS — G301 Alzheimer's disease with late onset: Secondary | ICD-10-CM | POA: Diagnosis not present

## 2016-11-12 NOTE — Progress Notes (Signed)
Home visit was conducted No sign of CHF or pneumonia Blood pressure good Flu vaccine given The patient was seen for home visit. Overall he is hanging in there doing well. He has a history of a DVT he is on chronic anticoagulation his DVT was unprovoked and it was felt that chronic anticoagulation was the best answer for this patient. The patient states that overall he's been feeling fairly good diet doing pretty good he is not active because of frailty and debilitated condition does not really get around. He does have some chronic swelling in the legs his lungs are clear his hearts regular his pulses normal blood pressure is good flu shot was given today. We will recheck this patient again in 8-12 weeks. Patient's dementia is stable he knows who I am but he also states that he is going later this day to go get a job

## 2016-11-16 ENCOUNTER — Telehealth: Payer: Self-pay | Admitting: Family Medicine

## 2016-11-16 MED ORDER — ALPRAZOLAM 0.5 MG PO TABS: 0.5000 mg | ORAL_TABLET | Freq: Every evening | ORAL | 1 refills | Status: DC | PRN

## 2016-11-16 NOTE — Telephone Encounter (Signed)
Unfortunately this is a common issue. Medication may or may not help this. A mild nerve medication at nighttime can help with sleep. If it causes him to have increased behavioral issues then I would not recommend this. I do recommend trying Xanax 0.5 mg 1 daily at bedtime #30, 1 refill. If it causes increased trouble to stop the medicine.

## 2016-11-16 NOTE — Telephone Encounter (Signed)
Discussed with daughter. Daughter advised Unfortunately this is a common issue. Medication may or may not help this. A mild nerve medication at nighttime can help with sleep. If it causes him to have increased behavioral issues then Dr Nicki Reaper would not recommend this. Dr Nicki Reaper  does recommend trying Xanax 0.5 mg 1 daily at bedtime #30, 1 refill. If it causes increased trouble to stop the medicine. Daughter verbalized understanding. Medication faxed to pharmacy.

## 2016-11-16 NOTE — Telephone Encounter (Signed)
Pt is having a hard time with sun downers. Pt is roaming at night and is threatening to bust windows out to get out of the house. Daughter is wanting to know if something can be called in to help the pt to rest at night. Please advise.

## 2016-11-21 ENCOUNTER — Other Ambulatory Visit: Payer: Self-pay | Admitting: Family Medicine

## 2016-11-27 NOTE — Telephone Encounter (Signed)
2 refills   

## 2017-01-08 ENCOUNTER — Other Ambulatory Visit: Payer: Self-pay

## 2017-01-08 ENCOUNTER — Ambulatory Visit (HOSPITAL_COMMUNITY)
Admission: RE | Admit: 2017-01-08 | Discharge: 2017-01-08 | Disposition: A | Discharge status: Home / Self Care | Payer: Medicare Other | Source: Ambulatory Visit | Attending: Family Medicine | Admitting: Family Medicine

## 2017-01-08 DIAGNOSIS — M79661 Pain in right lower leg: Secondary | ICD-10-CM | POA: Diagnosis not present

## 2017-01-08 DIAGNOSIS — M79604 Pain in right leg: Secondary | ICD-10-CM | POA: Diagnosis not present

## 2017-01-08 NOTE — Progress Notes (Unsigned)
Patient's daughter called in today stating that patient has c/o pressure behind right leg with some swelling, and skin that is cool to the touch. Discussed patient's symptoms with Dr.Scott Luking and was given verbal orders to order an ultrasound of the right lower extremity.

## 2017-01-13 ENCOUNTER — Other Ambulatory Visit: Payer: Self-pay | Admitting: Family Medicine

## 2017-01-13 NOTE — Telephone Encounter (Signed)
Patient may have this +2 refills

## 2017-02-28 ENCOUNTER — Emergency Department (HOSPITAL_COMMUNITY): Payer: Medicare Other

## 2017-02-28 ENCOUNTER — Emergency Department (HOSPITAL_COMMUNITY)
Admission: EM | Admit: 2017-02-28 | Discharge: 2017-02-28 | Disposition: A | Discharge status: Home / Self Care | Payer: Medicare Other | Attending: Emergency Medicine | Admitting: Emergency Medicine

## 2017-02-28 ENCOUNTER — Encounter (HOSPITAL_COMMUNITY): Payer: Self-pay

## 2017-02-28 DIAGNOSIS — F0390 Unspecified dementia without behavioral disturbance: Secondary | ICD-10-CM | POA: Insufficient documentation

## 2017-02-28 DIAGNOSIS — R079 Chest pain, unspecified: Secondary | ICD-10-CM | POA: Diagnosis not present

## 2017-02-28 DIAGNOSIS — R0789 Other chest pain: Secondary | ICD-10-CM | POA: Diagnosis not present

## 2017-02-28 HISTORY — DX: Acute embolism and thrombosis of unspecified deep veins of unspecified lower extremity: I82.409

## 2017-02-28 LAB — COMPREHENSIVE METABOLIC PANEL
ALT: 16 U/L — ABNORMAL LOW (ref 17–63)
AST: 23 U/L (ref 15–41)
Albumin: 4.1 g/dL (ref 3.5–5.0)
Alkaline Phosphatase: 95 U/L (ref 38–126)
Anion gap: 9 (ref 5–15)
BILIRUBIN TOTAL: 0.6 mg/dL (ref 0.3–1.2)
BUN: 24 mg/dL — ABNORMAL HIGH (ref 6–20)
CO2: 30 mmol/L (ref 22–32)
CREATININE: 1.19 mg/dL (ref 0.61–1.24)
Calcium: 9.9 mg/dL (ref 8.9–10.3)
Chloride: 105 mmol/L (ref 101–111)
GFR, EST NON AFRICAN AMERICAN: 53 mL/min — AB (ref >60)
Glucose, Bld: 93 mg/dL (ref 65–99)
POTASSIUM: 4.1 mmol/L (ref 3.5–5.1)
Sodium: 144 mmol/L (ref 135–145)
TOTAL PROTEIN: 7.7 g/dL (ref 6.5–8.1)

## 2017-02-28 LAB — CBC
HCT: 42.9 % (ref 39.0–52.0)
Hemoglobin: 13.7 g/dL (ref 13.0–17.0)
MCH: 28.2 pg (ref 26.0–34.0)
MCHC: 31.9 g/dL (ref 30.0–36.0)
MCV: 88.3 fL (ref 78.0–100.0)
PLATELETS: 162 10*3/uL (ref 150–400)
RBC: 4.86 MIL/uL (ref 4.22–5.81)
RDW: 14.1 % (ref 11.5–15.5)
WBC: 7.7 10*3/uL (ref 4.0–10.5)

## 2017-02-28 LAB — TROPONIN I

## 2017-02-28 MED ORDER — IOPAMIDOL (ISOVUE-370) INJECTION 76%
100.0000 mL | Freq: Once | INTRAVENOUS | Status: AC | PRN
  Administered 2017-02-28: 100 mL via INTRAVENOUS

## 2017-02-28 NOTE — Discharge Instructions (Signed)
Keep scheduled plan with Dr.Luking tomorrow

## 2017-02-28 NOTE — ED Notes (Signed)
Dr Lenna Sciara in to discuss with family awaiting 2nd trop to be drawn

## 2017-02-28 NOTE — ED Provider Notes (Signed)
[facility] DEPT Provider Note   CSN: [id number] Arrival date & time: 02/28/17  1423   Level V caveat dementia history is obtained from patient and patient's daughter  History   Chief Complaint Chief Complaint  Patient presents with  . Chest Pain    HPI [name] is a 83 y.o. male.Complained of anterior chest pain onset 2:30 PM today. Pain resolved upon arrival here. Without treatment. He denies any shortness of breath, or cough nausea sweatiness. He is presently asymptomatic.  HPI  Past Medical History:  Diagnosis Date  . Arthritis, hip   . Assistance needed for mobility    walks with walker  . DVT (deep venous thrombosis) ([place])   . Mild dementia   . Never smoked any substance   . Pneumonia 2002, 2010    Patient Active Problem List   Diagnosis Date Noted  . Palliative care encounter   . Hyponatremia 03/01/2016  . Left leg weakness 03/01/2016  . Generalized weakness 02/28/2016  . Weakness generalized 02/24/2016  . Foul smelling urine 02/24/2016  . Lung mass 02/24/2016  . Left leg DVT ([place]) 11/28/2015  . Alzheimer's disease 11/28/2015  . Insomnia 11/28/2015  . Pedal edema 09/20/2014  . Bilateral cellulitis of lower leg 07/28/2014  . CONDUCTION DISORDER OF THE HEART 07/23/2009  . Venous (peripheral) insufficiency 07/23/2009  . ARTHRITIS, HIP 07/15/2009    Past Surgical History:  Procedure Laterality Date  . APPENDECTOMY     At age 69  . HIP SURGERY     left  . TONSILLECTOMY     As a child       Home Medications    Prior to Admission medications   Medication Sig Start Date End Date Taking? Authorizing Provider  ALPRAZolam (XANAX) 0.5 MG tablet TAKE ONE TABLET BY MOUTH AT BEDTIME AS NEEDED. 01/14/17   Kathyrn Drown, MD  ELIQUIS 2.5 MG TABS tablet TAKE ONE TABLET BY MOUTH TWICE DAILY. 11/27/16   Kathyrn Drown, MD  Emollient [facility] EX) Apply 1 application topically at bedtime.    Historical Provider, MD  feeding supplement, ENSURE ENLIVE,  (ENSURE ENLIVE) LIQD Take 237 mLs by mouth 2 (two) times daily between meals. 03/02/16   Belkys A Regalado, MD  mupirocin ointment (BACTROBAN) 2 % Apply to affected area 1 times daily prn 03/10/16 03/10/17  Kathyrn Drown, MD    Family History No family history on file.  Social History Social History  Substance Use Topics  . Smoking status: Never Smoker  . Smokeless tobacco: Never Used  . Alcohol use No     Allergies   Ciprofloxacin; Keflex [cephalexin]; and Sulfa antibiotics   Review of Systems Review of Systems  Unable to perform ROS: Dementia     Physical Exam Updated Vital Signs BP 138/81 (BP Location: Right Arm)   Pulse (!) 51   Temp 98.2 F (36.8 C) (Oral)   Resp 18   Ht 6\' 4"  (1.93 m)   Wt 160 lb (72.6 kg)   SpO2 98%   BMI 19.48 kg/m   Physical Exam  Constitutional:  Chronically ill appearing, pleasantly demented  HENT:  Head: Normocephalic and atraumatic.  Eyes: Conjunctivae are normal. Pupils are equal, round, and reactive to light.  Neck: Neck supple. No tracheal deviation present. No thyromegaly present.  Cardiovascular: Regular rhythm.   No murmur heard. Bradycardic  Pulmonary/Chest: Effort normal and breath sounds normal.  Abdominal: Soft. Bowel sounds are normal. He exhibits no distension. There is no tenderness.  Musculoskeletal: Normal range of motion. He exhibits no edema or tenderness.  Neurological: He is alert. Coordination normal.  Skin: Skin is warm and dry. No rash noted.  Brawny chronic appearing skin changes to bilateral lower extremities below the knees bilaterally  Psychiatric: He has a normal mood and affect.  Nursing note and vitals reviewed.    ED Treatments / Results  Labs (all labs ordered are listed, but only abnormal results are displayed) Labs Reviewed  CBC  TROPONIN I  COMPREHENSIVE METABOLIC PANEL  TROPONIN I   Results for orders placed or performed during the hospital encounter of 02/28/17  CBC  Result Value Ref  Range   WBC 7.7 4.0 - 10.5 K/uL   RBC 4.86 4.22 - 5.81 MIL/uL   Hemoglobin 13.7 13.0 - 17.0 g/dL   HCT 42.9 39.0 - 52.0 %   MCV 88.3 78.0 - 100.0 fL   MCH 28.2 26.0 - 34.0 pg   MCHC 31.9 30.0 - 36.0 g/dL   RDW 14.1 11.5 - 15.5 %   Platelets 162 150 - 400 K/uL  Troponin I  Result Value Ref Range   Troponin I <0.03 <0.03 ng/mL  Comprehensive metabolic panel  Result Value Ref Range   Sodium 144 135 - 145 mmol/L   Potassium 4.1 3.5 - 5.1 mmol/L   Chloride 105 101 - 111 mmol/L   CO2 30 22 - 32 mmol/L   Glucose, Bld 93 65 - 99 mg/dL   BUN 24 (H) 6 - 20 mg/dL   Creatinine, Ser 1.19 0.61 - 1.24 mg/dL   Calcium 9.9 8.9 - 10.3 mg/dL   Total Protein 7.7 6.5 - 8.1 g/dL   Albumin 4.1 3.5 - 5.0 g/dL   AST 23 15 - 41 U/L   ALT 16 (L) 17 - 63 U/L   Alkaline Phosphatase 95 38 - 126 U/L   Total Bilirubin 0.6 0.3 - 1.2 mg/dL   GFR calc non Af Amer 53 (L) >60 mL/min   GFR calc Af Amer >60 >60 mL/min   Anion gap 9 5 - 15  Troponin I  Result Value Ref Range   Troponin I <0.03 <0.03 ng/mL   Ct Angio Chest Pe W Or Wo Contrast  Result Date: 02/28/2017 CLINICAL DATA:  Chest pain.  History of DVT. EXAM: CT ANGIOGRAPHY CHEST WITH CONTRAST TECHNIQUE: Multidetector CT imaging of the chest was performed using the standard protocol during bolus administration of intravenous contrast. Multiplanar CT image reconstructions and MIPs were obtained to evaluate the vascular anatomy. CONTRAST:  100 cc of Isovue 370 COMPARISON:  03/02/2016 FINDINGS: Cardiovascular: Satisfactory opacification of the pulmonary arteries to the segmental level. No evidence of pulmonary embolism. Normal heart size. No pericardial effusion. Aortic atherosclerosis. Calcification in the LAD, left circumflex coronary arteries noted. Mediastinum/Nodes: No enlarged mediastinal, hilar, or axillary lymph nodes. Thyroid gland, trachea, and esophagus demonstrate no significant findings. Lungs/Pleura: No pleural effusion. Scarring identified within the  posterior right lung base. Right middle lobe calcified granuloma identified. Right upper lobe granuloma also noted. Scar within the left upper lobe identified, image 38 of series 6. Upper Abdomen: No acute abnormality. Musculoskeletal: No chest wall abnormality. No acute or significant osseous findings. Review of the MIP images confirms the above findings. IMPRESSION: 1. No evidence for pulmonary embolus. 2. Aortic atherosclerosis and multi vessel coronary artery calcification 3. Prior granulomatous disease. Electronically Signed   By: Kerby Moors M.D.   On: 02/28/2017 17:57   EKG ED ECG REPORT   Date: 02/28/2017  Rate: 50  Rhythm: sinus bradycardia  QRS Axis: left  Intervals: PR prolonged  ST/T Wave abnormalities: nonspecific T wave changes  Conduction Disutrbances:right bundle branch block and left anterior fascicular block  Narrative Interpretation:   Old EKG Reviewed: Rate slower than previous tracing  I have personally reviewed the EKG tracing and agree with the computerized printout as noted.  Radiology No results found.  Procedures Procedures (including critical care time)  Medications Ordered in ED Medications - No data to display   Initial Impression / Assessment and Plan / ED Course  I have reviewed the triage vital signs and the nursing notes.  Pertinent labs & imaging results that were available during my care of the patient were reviewed by me and considered in my medical decision making (see chart for details).   he remained asymptomatic throughout the entire ED stay. At 7:15 PM patient remains comfortable and asymptomatic.  Chest pain is felt to be nonspecific. Pulmonary embolism has been ruled out by CT angiogram. Heart score equals 3 based on age and EKG . Plan: He has scheduled visit with Dr.Luk Ing tomorrow  Final Clinical Impressions(s) / ED Diagnoses  Diagnoses nonspecific chest pain Final diagnoses:  None    New Prescriptions New Prescriptions    No medications on file     Orlie Dakin, MD 02/28/17 1924

## 2017-02-28 NOTE — ED Notes (Signed)
To CT

## 2017-02-28 NOTE — ED Triage Notes (Signed)
Reports of mid chest pain that started this morning with pain in upper back when he takes a deep breath in. Per daughter patient has blood clots in both legs. Patient also reports of pain in left leg. NAD noted. Denies chest pain at this time.

## 2017-03-03 ENCOUNTER — Encounter: Payer: Medicare Other | Admitting: Family Medicine

## 2017-03-05 ENCOUNTER — Ambulatory Visit: Payer: Medicare Other | Admitting: Family Medicine

## 2017-03-05 DIAGNOSIS — F0280 Dementia in other diseases classified elsewhere without behavioral disturbance: Secondary | ICD-10-CM

## 2017-03-05 DIAGNOSIS — G301 Alzheimer's disease with late onset: Secondary | ICD-10-CM | POA: Diagnosis not present

## 2017-03-05 DIAGNOSIS — I872 Venous insufficiency (chronic) (peripheral): Secondary | ICD-10-CM | POA: Diagnosis not present

## 2017-03-05 NOTE — Progress Notes (Signed)
Home visit was done for this patient. He is homebound. He has a history of DVT and spends a lot of time in a recliner. Patient denies any shortness of breath but went to the ER over the weekend with chest pain. I reviewed over his EKG lab work and Doctor, general practice. Patient overall stable currently blood pressures have been good eating well lungs clear heart regular pulse normal has some edema in the legs but not as bad as it has been patient does have mild dementia Mild dementia and normal for age not severe Insomnia Xanax when necessary History DVT unprovoked with sedentary lifestyle continue Eliquis no sign of any bleeding issues Follow-up again in 3 months

## 2017-04-23 ENCOUNTER — Other Ambulatory Visit: Payer: Self-pay | Admitting: Family Medicine

## 2017-04-27 NOTE — Telephone Encounter (Signed)
Name refill this +5 additional refills

## 2017-04-27 NOTE — Telephone Encounter (Signed)
Last seen April 2018

## 2017-05-05 ENCOUNTER — Telehealth: Payer: Self-pay | Admitting: Family Medicine

## 2017-05-05 NOTE — Telephone Encounter (Signed)
Prescription called into pharmacy. Daughter notified.

## 2017-05-05 NOTE — Telephone Encounter (Signed)
Ok ref prn 

## 2017-05-05 NOTE — Telephone Encounter (Signed)
Pt is needing a refill on Emollient ([place])    [facility]

## 2017-05-12 ENCOUNTER — Telehealth: Payer: Self-pay | Admitting: Family Medicine

## 2017-05-12 NOTE — Telephone Encounter (Signed)
Artificial tear ointments are now all otc, ask druggist his or her prfereence at drugstore, but to me they are all very similar, use every 3  Hrs or so

## 2017-05-12 NOTE — Telephone Encounter (Signed)
Spoke with patient's daughter and informed her per Dr.Steve Luking- Artificial tear ointments are now all otc, ask druggist his or her prfereence at drugstore, but to me they are all very similar, use every 3  Hrs or so. Patient's daughter verbalized understanding.

## 2017-05-12 NOTE — Telephone Encounter (Signed)
Pt is having a problem with his eyes getting really dry. Pt states that it feels like his eye lids are sticking to his eyes. Daughter states that he use to use a salve on them. Please advise.

## 2017-06-03 ENCOUNTER — Telehealth: Payer: Self-pay

## 2017-06-03 MED ORDER — NITROGLYCERIN 0.4 MG SL SUBL: 0.4000 mg | SUBLINGUAL_TABLET | SUBLINGUAL | 0 refills | Status: AC | PRN

## 2017-06-03 NOTE — Telephone Encounter (Signed)
Patient having intermittent chest discomfort over the past week not having any currently I will do a home visit at 1145. Please put him on my schedule as an 11:30 appointment. Please touch base with: Between 10 and 11 on Friday morning. If he is having active chest pain when you call he will need to go to ER if not I will see him as a home visit.

## 2017-06-03 NOTE — Telephone Encounter (Signed)
Pt's daughter Laren Everts called today states her father was having chest pain that radiated down left arm yesterday. (She was concerned as the pt has a history left leg blood clots) Pt is fine today, with no complaints. Per Dr.Scott this is not related to the blood clots, have the pt continue the Eliquis and take Nitroglycerin sl if reoccurs and call emergency services. Pt's daughter made aware and states they did not have any nitroglycerin on hand. I have sent in the nitroglycerin to [place] per her request. Her number if you wish to call her is [phone]) [phone].

## 2017-06-04 ENCOUNTER — Encounter: Payer: Medicare Other | Admitting: Family Medicine

## 2017-06-04 NOTE — Telephone Encounter (Signed)
Daughter stated they wanted to hold on visit for today and will call next week to schedule a home visit. The will call 911 if patient has reoccurrence of chest pain.

## 2017-06-21 ENCOUNTER — Telehealth: Payer: Self-pay | Admitting: Family Medicine

## 2017-06-21 NOTE — Telephone Encounter (Signed)
Pt's grandson called to see when Dr. Nicki Reaper can visit the pt. He has forms that need filled out and there is something wrong with the pts eye. Please advise.

## 2017-06-22 NOTE — Telephone Encounter (Signed)
Friday 11:30, please put on the schedule and please inform family

## 2017-06-22 NOTE — Telephone Encounter (Signed)
Called and left message on grandsons voicemail to return call.

## 2017-06-25 ENCOUNTER — Ambulatory Visit: Payer: Medicare Other | Admitting: Family Medicine

## 2017-06-25 DIAGNOSIS — H01005 Unspecified blepharitis left lower eyelid: Secondary | ICD-10-CM | POA: Diagnosis not present

## 2017-06-25 DIAGNOSIS — F0390 Unspecified dementia without behavioral disturbance: Secondary | ICD-10-CM

## 2017-06-25 MED ORDER — GENTAMICIN FORTIFIED OPHTHALMIC SOLUTION: OPHTHALMIC | 0 refills | Status: DC

## 2017-06-25 MED ORDER — OLOPATADINE HCL 0.1 % OP SOLN: 1.0000 [drp] | Freq: Two times a day (BID) | OPHTHALMIC | 12 refills | Status: AC

## 2017-06-25 NOTE — Progress Notes (Signed)
This patient was seen as a home visit 25 minutes spent with patient greater than half in discussion Patient with significant crusting in the left eye No high fever chills or sweats Appetite good Some swelling in the feet He is going to be going to a day-care center twice a week Patient mildly confused but at the same time relatively oriented Lungs are clear no crackles Crusting left eye blepharitis Heart normal Some pedal edema Skin warm dry Blood pressure good 110/70 Dementia stable Daycare setting 2 days week would be a good idea Blepharitis eyedrops as recommended TB skin test next week

## 2017-06-25 NOTE — Telephone Encounter (Signed)
Grandson notified and appreciates home visit.

## 2017-06-28 ENCOUNTER — Other Ambulatory Visit: Payer: Self-pay | Admitting: *Deleted

## 2017-06-28 MED ORDER — GENTAMICIN FORTIFIED OPHTHALMIC SOLUTION: OPHTHALMIC | 0 refills | Status: DC

## 2017-07-07 DIAGNOSIS — Z111 Encounter for screening for respiratory tuberculosis: Secondary | ICD-10-CM

## 2017-07-08 ENCOUNTER — Ambulatory Visit (INDEPENDENT_AMBULATORY_CARE_PROVIDER_SITE_OTHER): Payer: Medicare Other

## 2017-07-08 DIAGNOSIS — Z111 Encounter for screening for respiratory tuberculosis: Secondary | ICD-10-CM

## 2017-07-09 ENCOUNTER — Telehealth: Payer: Self-pay | Admitting: *Deleted

## 2017-07-09 NOTE — Telephone Encounter (Signed)
Tyron Russell at adult protective services calling because she has some concerns about the care pt is receiving. She states the care takers could not find his prescription of xanax and his son did not know he was prescribed this med. Stephanie aware dr Nicki Reaper out of office this afternoon and will return on Monday. She was fine with waiting. She states she does not need to talk with dr Richardson Landry who is on call for dr scott.  [phone] ext [phone]

## 2017-07-11 NOTE — Telephone Encounter (Signed)
Nurse's-please call the pharmacy to find out specifically when was the last prescription of Xanax filled. Also can their system tell you which family member pick this up or was it delivered to the house?

## 2017-07-12 NOTE — Telephone Encounter (Signed)
Xanax #30 was delivered to house and signed for by Darrick Meigs on 07/05/17 (it was a 30 day supply-one at bedtime as needed per pharmacy)

## 2017-07-13 ENCOUNTER — Encounter: Payer: Self-pay | Admitting: Family Medicine

## 2017-07-13 ENCOUNTER — Telehealth: Payer: Self-pay | Admitting: Family Medicine

## 2017-07-13 NOTE — Telephone Encounter (Signed)
Spoke with Dorothea Ogle at [facility] who cancelled all of the patient's xanax refills per doctor's orders.

## 2017-07-13 NOTE — Telephone Encounter (Signed)
Cancel all Xanax refills, this may have already been done. Make sure pharmacy is aware of this please call them and document that they are aware that we are canceling this-I have sent a letter to the family

## 2017-07-13 NOTE — Telephone Encounter (Signed)
Social services request I did call [place] social worker with adult protective services. She stated that she did a home visit on August 10. She asked to see his medications. They were able to produce Eliquis but not Xanax. Xanax was signed for by a family member on August 6. Based on this information. Cancel Xanax refills at [facility]. I would recommend not using Xanax because of increased risk of disorientation and falls as well as it is hard to know where that prescription ended up.

## 2017-07-14 NOTE — Telephone Encounter (Signed)
   07/13/17 10:26 AM  Note    Spoke with Dorothea Ogle at [facility] who cancelled all of the patient's xanax refills per doctor's orders.

## 2017-10-05 ENCOUNTER — Telehealth: Payer: Self-pay | Admitting: Family Medicine

## 2017-10-05 NOTE — Telephone Encounter (Signed)
Certainly if there is any turn for the possibility of this being a life-threatening issue I recommend going to the ER.  Otherwise I can do a home visit on Thursday at 1130.  This could be scheduled as an 1130 appointment.  Please discuss with family try to get a feel that if they do not believe that this is an emergency issue bilateral to go to the ER otherwise I can do a home visit on Thursday

## 2017-10-05 NOTE — Telephone Encounter (Addendum)
Requesting to set up a home visit with Dr. Nicki Reaper.  The last couple of days [name] does not want to eat, he cannot stay awake and he is real weak.  Please advise.

## 2017-10-05 NOTE — Telephone Encounter (Signed)
Pt son Sherwood called and would like a home visit with you soon. He state there is noway he can bring him in. He says pt is very weak(too weak to walk) He is using a wheel chair to take him around the house, no appetite,having trouble staying awake, state his whole body aches. Please advise.

## 2017-10-06 NOTE — Telephone Encounter (Signed)
I spoke w [name] the grandson he states he does not think it is something pt needs to go to the ED for. I advised that if pt gets worse before the appt on 10/07/2017 please take to the ED. Front desk is aware to set up.

## 2017-10-07 ENCOUNTER — Other Ambulatory Visit: Payer: Self-pay

## 2017-10-07 ENCOUNTER — Encounter (HOSPITAL_COMMUNITY): Payer: Self-pay | Admitting: Emergency Medicine

## 2017-10-07 ENCOUNTER — Ambulatory Visit: Payer: Medicare Other | Admitting: Family Medicine

## 2017-10-07 ENCOUNTER — Inpatient Hospital Stay (HOSPITAL_COMMUNITY)
Admission: EM | Admit: 2017-10-07 | Discharge: 2017-10-11 | DRG: 871 | Disposition: A | Discharge status: Skilled Nursing Facility | Payer: Medicare Other | Attending: Family Medicine | Admitting: Family Medicine

## 2017-10-07 ENCOUNTER — Emergency Department (HOSPITAL_COMMUNITY): Payer: Medicare Other

## 2017-10-07 ENCOUNTER — Inpatient Hospital Stay (HOSPITAL_COMMUNITY): Payer: Medicare Other

## 2017-10-07 VITALS — BP 120/70

## 2017-10-07 DIAGNOSIS — Z9181 History of falling: Secondary | ICD-10-CM | POA: Diagnosis not present

## 2017-10-07 DIAGNOSIS — S8991XA Unspecified injury of right lower leg, initial encounter: Secondary | ICD-10-CM | POA: Diagnosis not present

## 2017-10-07 DIAGNOSIS — E860 Dehydration: Secondary | ICD-10-CM

## 2017-10-07 DIAGNOSIS — N179 Acute kidney failure, unspecified: Secondary | ICD-10-CM | POA: Diagnosis present

## 2017-10-07 DIAGNOSIS — M25559 Pain in unspecified hip: Secondary | ICD-10-CM | POA: Diagnosis present

## 2017-10-07 DIAGNOSIS — J181 Lobar pneumonia, unspecified organism: Secondary | ICD-10-CM

## 2017-10-07 DIAGNOSIS — M6281 Muscle weakness (generalized): Secondary | ICD-10-CM | POA: Diagnosis not present

## 2017-10-07 DIAGNOSIS — F0280 Dementia in other diseases classified elsewhere without behavioral disturbance: Secondary | ICD-10-CM | POA: Diagnosis not present

## 2017-10-07 DIAGNOSIS — R41841 Cognitive communication deficit: Secondary | ICD-10-CM | POA: Diagnosis not present

## 2017-10-07 DIAGNOSIS — Y92009 Unspecified place in unspecified non-institutional (private) residence as the place of occurrence of the external cause: Secondary | ICD-10-CM

## 2017-10-07 DIAGNOSIS — Z66 Do not resuscitate: Secondary | ICD-10-CM | POA: Diagnosis present

## 2017-10-07 DIAGNOSIS — G309 Alzheimer's disease, unspecified: Secondary | ICD-10-CM | POA: Diagnosis present

## 2017-10-07 DIAGNOSIS — Z7901 Long term (current) use of anticoagulants: Secondary | ICD-10-CM | POA: Diagnosis not present

## 2017-10-07 DIAGNOSIS — R9431 Abnormal electrocardiogram [ECG] [EKG]: Secondary | ICD-10-CM | POA: Diagnosis present

## 2017-10-07 DIAGNOSIS — R1312 Dysphagia, oropharyngeal phase: Secondary | ICD-10-CM | POA: Diagnosis not present

## 2017-10-07 DIAGNOSIS — M25561 Pain in right knee: Secondary | ICD-10-CM | POA: Diagnosis not present

## 2017-10-07 DIAGNOSIS — M1610 Unilateral primary osteoarthritis, unspecified hip: Secondary | ICD-10-CM | POA: Diagnosis present

## 2017-10-07 DIAGNOSIS — A419 Sepsis, unspecified organism: Secondary | ICD-10-CM | POA: Diagnosis not present

## 2017-10-07 DIAGNOSIS — M1611 Unilateral primary osteoarthritis, right hip: Secondary | ICD-10-CM | POA: Diagnosis not present

## 2017-10-07 DIAGNOSIS — Z86718 Personal history of other venous thrombosis and embolism: Secondary | ICD-10-CM

## 2017-10-07 DIAGNOSIS — Z5189 Encounter for other specified aftercare: Secondary | ICD-10-CM | POA: Diagnosis not present

## 2017-10-07 DIAGNOSIS — F0390 Unspecified dementia without behavioral disturbance: Secondary | ICD-10-CM | POA: Diagnosis not present

## 2017-10-07 DIAGNOSIS — E876 Hypokalemia: Secondary | ICD-10-CM | POA: Diagnosis present

## 2017-10-07 DIAGNOSIS — W19XXXA Unspecified fall, initial encounter: Secondary | ICD-10-CM

## 2017-10-07 DIAGNOSIS — J189 Pneumonia, unspecified organism: Secondary | ICD-10-CM | POA: Diagnosis present

## 2017-10-07 DIAGNOSIS — R296 Repeated falls: Secondary | ICD-10-CM | POA: Diagnosis not present

## 2017-10-07 DIAGNOSIS — R402421 Glasgow coma scale score 9-12, in the field [EMT or ambulance]: Secondary | ICD-10-CM | POA: Diagnosis not present

## 2017-10-07 DIAGNOSIS — G301 Alzheimer's disease with late onset: Secondary | ICD-10-CM | POA: Diagnosis not present

## 2017-10-07 DIAGNOSIS — Z882 Allergy status to sulfonamides status: Secondary | ICD-10-CM

## 2017-10-07 DIAGNOSIS — R531 Weakness: Secondary | ICD-10-CM | POA: Diagnosis not present

## 2017-10-07 DIAGNOSIS — R262 Difficulty in walking, not elsewhere classified: Secondary | ICD-10-CM | POA: Diagnosis not present

## 2017-10-07 DIAGNOSIS — Z881 Allergy status to other antibiotic agents status: Secondary | ICD-10-CM

## 2017-10-07 LAB — COMPREHENSIVE METABOLIC PANEL WITH GFR
ALT: 72 U/L — ABNORMAL HIGH (ref 17–63)
AST: 79 U/L — ABNORMAL HIGH (ref 15–41)
Albumin: 2.5 g/dL — ABNORMAL LOW (ref 3.5–5.0)
Alkaline Phosphatase: 113 U/L (ref 38–126)
Anion gap: 11 (ref 5–15)
BUN: 25 mg/dL — ABNORMAL HIGH (ref 6–20)
CO2: 26 mmol/L (ref 22–32)
Calcium: 7.9 mg/dL — ABNORMAL LOW (ref 8.9–10.3)
Chloride: 102 mmol/L (ref 101–111)
Creatinine, Ser: 1.35 mg/dL — ABNORMAL HIGH (ref 0.61–1.24)
GFR calc Af Amer: 52 mL/min — ABNORMAL LOW
GFR calc non Af Amer: 45 mL/min — ABNORMAL LOW
Glucose, Bld: 124 mg/dL — ABNORMAL HIGH (ref 65–99)
Potassium: 3.3 mmol/L — ABNORMAL LOW (ref 3.5–5.1)
Sodium: 139 mmol/L (ref 135–145)
Total Bilirubin: 0.9 mg/dL (ref 0.3–1.2)
Total Protein: 6.9 g/dL (ref 6.5–8.1)

## 2017-10-07 LAB — CBC WITH DIFFERENTIAL/PLATELET
BASOS PCT: 0 %
Basophils Absolute: 0 10*3/uL (ref 0.0–0.1)
EOS ABS: 0 10*3/uL (ref 0.0–0.7)
EOS PCT: 0 %
HCT: 39.7 % (ref 39.0–52.0)
Hemoglobin: 12.3 g/dL — ABNORMAL LOW (ref 13.0–17.0)
LYMPHS ABS: 0.6 10*3/uL — AB (ref 0.7–4.0)
Lymphocytes Relative: 4 %
MCH: 27.5 pg (ref 26.0–34.0)
MCHC: 31 g/dL (ref 30.0–36.0)
MCV: 88.8 fL (ref 78.0–100.0)
MONOS PCT: 9 %
Monocytes Absolute: 1.4 10*3/uL — ABNORMAL HIGH (ref 0.1–1.0)
Neutro Abs: 13.2 10*3/uL — ABNORMAL HIGH (ref 1.7–7.7)
Neutrophils Relative %: 87 %
PLATELETS: 208 10*3/uL (ref 150–400)
RBC: 4.47 MIL/uL (ref 4.22–5.81)
RDW: 14.6 % (ref 11.5–15.5)
WBC: 15.3 10*3/uL — AB (ref 4.0–10.5)

## 2017-10-07 LAB — URINALYSIS, ROUTINE W REFLEX MICROSCOPIC
Bacteria, UA: NONE SEEN
Bilirubin Urine: NEGATIVE
Glucose, UA: NEGATIVE mg/dL
Ketones, ur: NEGATIVE mg/dL
Leukocytes, UA: NEGATIVE
Nitrite: NEGATIVE
PH: 5 (ref 5.0–8.0)
Protein, ur: 100 mg/dL — AB
SPECIFIC GRAVITY, URINE: 1.024 (ref 1.005–1.030)

## 2017-10-07 LAB — TROPONIN I: Troponin I: 0.04 ng/mL (ref <0.03)

## 2017-10-07 LAB — LACTIC ACID, PLASMA
Lactic Acid, Venous: 2 mmol/L (ref 0.5–1.9)
Lactic Acid, Venous: 1.6 mmol/L (ref 0.5–1.9)

## 2017-10-07 LAB — INFLUENZA PANEL BY PCR (TYPE A & B)
Influenza A By PCR: NEGATIVE
Influenza B By PCR: NEGATIVE

## 2017-10-07 LAB — PROCALCITONIN: Procalcitonin: 0.55 ng/mL

## 2017-10-07 MED ORDER — OLOPATADINE HCL 0.1 % OP SOLN
1.0000 [drp] | Freq: Two times a day (BID) | OPHTHALMIC | Status: DC
  Administered 2017-10-07 – 2017-10-11 (×8): 1 [drp] via OPHTHALMIC
  Filled 2017-10-07: qty 5

## 2017-10-07 MED ORDER — DEXTROSE 5 % IV SOLN
1.0000 g | INTRAVENOUS | Status: DC
  Administered 2017-10-08 – 2017-10-10 (×4): 1 g via INTRAVENOUS
  Filled 2017-10-07 (×6): qty 10

## 2017-10-07 MED ORDER — ONDANSETRON HCL 4 MG/2ML IJ SOLN
4.0000 mg | Freq: Once | INTRAMUSCULAR | Status: AC
  Administered 2017-10-07: 4 mg via INTRAVENOUS
  Filled 2017-10-07: qty 2

## 2017-10-07 MED ORDER — DEXTROSE 5 % IV SOLN
500.0000 mg | INTRAVENOUS | Status: DC
  Administered 2017-10-07 – 2017-10-10 (×4): 500 mg via INTRAVENOUS
  Filled 2017-10-07 (×6): qty 500

## 2017-10-07 MED ORDER — VANCOMYCIN HCL 10 G IV SOLR
1250.0000 mg | Freq: Once | INTRAVENOUS | Status: AC
  Administered 2017-10-07: 1250 mg via INTRAVENOUS
  Filled 2017-10-07: qty 1250

## 2017-10-07 MED ORDER — PIPERACILLIN-TAZOBACTAM 3.375 G IVPB 30 MIN
3.3750 g | Freq: Once | INTRAVENOUS | Status: AC
  Administered 2017-10-07: 3.375 g via INTRAVENOUS
  Filled 2017-10-07: qty 50

## 2017-10-07 MED ORDER — ALPRAZOLAM 0.5 MG PO TABS
0.5000 mg | ORAL_TABLET | Freq: Every evening | ORAL | Status: DC | PRN
  Administered 2017-10-08 – 2017-10-10 (×3): 0.5 mg via ORAL
  Filled 2017-10-07 (×3): qty 1

## 2017-10-07 MED ORDER — SODIUM CHLORIDE 0.9 % IV SOLN
1000.0000 mL | INTRAVENOUS | Status: DC
  Administered 2017-10-07: 1000 mL via INTRAVENOUS

## 2017-10-07 MED ORDER — APIXABAN 2.5 MG PO TABS
2.5000 mg | ORAL_TABLET | Freq: Two times a day (BID) | ORAL | Status: DC
  Administered 2017-10-07 – 2017-10-11 (×8): 2.5 mg via ORAL
  Filled 2017-10-07 (×8): qty 1

## 2017-10-07 MED ORDER — PREDNISONE 20 MG PO TABS
40.0000 mg | ORAL_TABLET | Freq: Every day | ORAL | Status: DC
  Administered 2017-10-08 – 2017-10-11 (×4): 40 mg via ORAL
  Filled 2017-10-07 (×4): qty 2

## 2017-10-07 MED ORDER — MORPHINE SULFATE (PF) 2 MG/ML IV SOLN
2.0000 mg | INTRAVENOUS | Status: DC | PRN
  Administered 2017-10-08 – 2017-10-11 (×5): 2 mg via INTRAVENOUS
  Filled 2017-10-07 (×5): qty 1

## 2017-10-07 MED ORDER — PIPERACILLIN-TAZOBACTAM 3.375 G IVPB: 3.3750 g | Freq: Three times a day (TID) | INTRAVENOUS | Status: DC

## 2017-10-07 MED ORDER — SODIUM CHLORIDE 0.9 % IV BOLUS (SEPSIS)
500.0000 mL | Freq: Once | INTRAVENOUS | Status: AC
  Administered 2017-10-07: 500 mL via INTRAVENOUS

## 2017-10-07 MED ORDER — VANCOMYCIN HCL IN DEXTROSE 1-5 GM/200ML-% IV SOLN: 1000.0000 mg | INTRAVENOUS | Status: DC

## 2017-10-07 MED ORDER — VANCOMYCIN HCL IN DEXTROSE 1-5 GM/200ML-% IV SOLN: 1000.0000 mg | Freq: Once | INTRAVENOUS | Status: DC

## 2017-10-07 MED ORDER — SODIUM CHLORIDE 0.9 % IV SOLN
INTRAVENOUS | Status: DC
  Administered 2017-10-08 – 2017-10-11 (×6): via INTRAVENOUS

## 2017-10-07 NOTE — ED Triage Notes (Signed)
Pt sent to ED for dehydration, foul smelling urine and fever. Hx dementia.

## 2017-10-07 NOTE — Progress Notes (Signed)
Home visit-patient was seen at the request of family. They state that he has not been doing well recently over the past week not eating as much. The grandson said states that he is drinking well but he is staying either in bed or a recliner on a regular basis. It's been very difficult to get him up and walking. They deny any fever but they do state there has been some coughing he does have a history of DVT mild dementia as well as a previous history of pneumonia Patient moderately lethargic he's able to respond he does interact he does not seem to be as alert or interactive is normal Heart slightly tachycardic lungs crackles on the left side with wheezes Blood pressure 120/70 Skin turgor poor Skin moist Assessment and plan probable dehydration possible electrolyte abnormalities. Probable underlying pneumonia but cannot be certain This patient is DO NOT RESUSCITATE but family still desires interventions short of that to help him out should he have trouble After discussion with his daughter over the phone they agreed to use 911/EMS to bring the patient to the ER for further evaluation

## 2017-10-07 NOTE — ED Notes (Signed)
CRITICAL VALUE ALERT  Critical Value:  Lactic acid 2.0  Date & Time Notied:  10/07/17 1501  Provider Notified: EDP Zackowski notified  Orders Received/Actions taken: na

## 2017-10-07 NOTE — ED Notes (Signed)
EKG reviewed by Dr. Rogene Houston

## 2017-10-07 NOTE — ED Provider Notes (Signed)
 EMERGENCY DEPARTMENT Provider Note   CSN: [id number] Arrival date & time: 10/07/17  1330     History   Chief Complaint Chief Complaint  Patient presents with  . Dehydration    HPI [name] is a 83 y.o. male.  Patient brought in by family members for increased weakness over the past 3 days.  Patient also had foul-smelling urine.  And then noted he had patient has a history of dementia which is baseline.  Patient is a DO NOT RESUSCITATE.  Patient is cared for at home with assistance in by family members.      Past Medical History:  Diagnosis Date  . Arthritis, hip   . Assistance needed for mobility    walks with walker  . DVT (deep venous thrombosis) ([facility])   . Mild dementia   . Never smoked any substance   . Pneumonia 2002, 2010    Patient Active Problem List   Diagnosis Date Noted  . Palliative care encounter   . Hyponatremia 03/01/2016  . Left leg weakness 03/01/2016  . Generalized weakness 02/28/2016  . Weakness generalized 02/24/2016  . Foul smelling urine 02/24/2016  . Lung mass 02/24/2016  . Left leg DVT ([facility]CC) 11/28/2015  . Alzheimer's disease 11/28/2015  . Insomnia 11/28/2015  . Pedal edema 09/20/2014  . Bilateral cellulitis of lower leg 07/28/2014  . CONDUCTION DISORDER OF THE HEART 07/23/2009  . Venous (peripheral) insufficiency 07/23/2009  . ARTHRITIS, HIP 07/15/2009    Past Surgical History:  Procedure Laterality Date  . APPENDECTOMY     At age 48  . HIP SURGERY     left  . TONSILLECTOMY     As a child       Home Medications    Prior to Admission medications   Medication Sig Start Date End Date Taking? Authorizing Provider  ALPRAZolam (XANAX) 0.5 MG tablet TAKE ONE TABLET BY MOUTH AT BEDTIME AS NEEDED. 04/27/17   Luking,  A, MD  calcium carbonate (TUMS EX) 750 MG chewable tablet Chew 2 tablets by mouth every 2 (two) hours as needed for heartburn.    [provider]  ELIQUIS 2.5 MG TABS tablet TAKE ONE  TABLET BY MOUTH TWICE DAILY. 11/27/16   Luking,  A, MD  Gentamicin Sulfate (GENTAMICIN FORTIFIED) 7 ml SOLN Gentamicin eyedrops 2 drops left eye 3 times daily for the next 5 days 06/28/17   Luking,  A, MD  nitroGLYCERIN (NITROSTAT) 0.4 MG SL tablet Place 1 tablet (0.4 mg total) under the tongue every 5 (five) minutes as needed for chest pain. 06/03/17   Luking,  A, MD  olopatadine (PATANOL) 0.1 % ophthalmic solution Place 1 drop into both eyes 2 (two) times daily. 06/25/17   Luking,  A, MD    Family History No family history on file.  Social History Social History   Tobacco Use  . Smoking status: Never Smoker  . Smokeless tobacco: Never Used  Substance Use Topics  . Alcohol use: No  . Drug use: No     Allergies   Ciprofloxacin; Keflex [cephalexin]; and Sulfa antibiotics   Review of Systems Review of Systems  Unable to perform ROS: Dementia  Psychiatric/Behavioral: Positive for confusion.     Physical Exam Updated Vital Signs BP (!) 147/72   Pulse 75   Temp (!) 101.2 F (38.4 C) (Oral)   Resp (!) 22   Ht 1.854 m (6' 1")   Wt 72.6 kg (160 lb)   SpO2 94%     BMI 21.11 kg/m   Physical Exam  Constitutional: He is oriented to person, place, and time. He appears well-developed and well-nourished. No distress.  HENT:  Head: Normocephalic and atraumatic.  Mouth/Throat: Oropharynx is clear and moist.  Eyes: Conjunctivae and EOM are normal. Pupils are equal, round, and reactive to light.  Neck: Normal range of motion. Neck supple.  Cardiovascular: Normal rate and regular rhythm.  Pulmonary/Chest: Effort normal and breath sounds normal.  Abdominal: Soft. Bowel sounds are normal. There is no tenderness.  Musculoskeletal: Normal range of motion.  Neurological: He is alert and oriented to person, place, and time. No cranial nerve deficit. He exhibits normal muscle tone.  Skin: Skin is warm.  Nursing note and vitals reviewed.    ED Treatments / Results    Labs (all labs ordered are listed, but only abnormal results are displayed) Labs Reviewed  LACTIC ACID, PLASMA - Abnormal; Notable for the following components:      Result Value   Lactic Acid, Venous 2.0 (*)    All other components within normal limits  CBC WITH DIFFERENTIAL/PLATELET - Abnormal; Notable for the following components:   WBC 15.3 (*)    Hemoglobin 12.3 (*)    Neutro Abs 13.2 (*)    Lymphs Abs 0.6 (*)    Monocytes Absolute 1.4 (*)    All other components within normal limits  URINALYSIS, ROUTINE W REFLEX MICROSCOPIC - Abnormal; Notable for the following components:   Color, Urine AMBER (*)    APPearance HAZY (*)    Hgb urine dipstick SMALL (*)    Protein, ur 100 (*)    Squamous Epithelial / LPF 0-5 (*)    All other components within normal limits  COMPREHENSIVE METABOLIC PANEL - Abnormal; Notable for the following components:   Potassium 3.3 (*)    Glucose, Bld 124 (*)    BUN 25 (*)    Creatinine, Ser 1.35 (*)    Calcium 7.9 (*)    Albumin 2.5 (*)    AST 79 (*)    ALT 72 (*)    GFR calc non Af Amer 45 (*)    GFR calc Af Amer 52 (*)    All other components within normal limits  URINE CULTURE  CULTURE, BLOOD (ROUTINE X 2)  CULTURE, BLOOD (ROUTINE X 2)  LACTIC ACID, PLASMA    EKG  EKG Interpretation  Date/Time:  Thursday October 07 2017 13:41:29 EST Ventricular Rate:  96 PR Interval:    QRS Duration: 153 QT Interval:  375 QTC Calculation: 474 R Axis:   -83 Text Interpretation:  Sinus rhythm Probable left atrial enlargement RBBB and LAFB Inferior infarct, acute (RCA) Lateral leads are also involved Probable RV involvement, suggest recording right precordial leads Baseline wander in lead(s) V4 V5 Confirmed by ,  ([id number]) on 10/07/2017 2:06:44 PM       Radiology Dg Chest Port 1 View  Result Date: 10/07/2017 CLINICAL DATA:  Fever, dementia EXAM: PORTABLE CHEST 1 VIEW COMPARISON:  CT angio chest of 02/28/2017 and chest x-ray of 02/24/2016  FINDINGS: There is new parenchymal opacity within the right mid upper lung field most consistent with pneumonia and consolidation. Small right pleural effusion cannot be excluded. The heart is borderline enlarged. No acute bony abnormality is seen. IMPRESSION: 1. Consolidation within the right upper mid lung most consistent with pneumonia with possible small right effusion. 2. Stable borderline cardiomegaly. Electronically Signed   By: Paul  Barry M.D.   On: 10/07/2017 14:28    Procedures   Procedures (including critical care time)  CRITICAL CARE Performed by: Leif Loflin Total critical care time: 30 minutes Critical care time was exclusive of separately billable procedures and treating other patients. Critical care was necessary to treat or prevent imminent or life-threatening deterioration. Critical care was time spent personally by me on the following activities: development of treatment plan with patient and/or surrogate as well as nursing, discussions with consultants, evaluation of patient's response to treatment, examination of patient, obtaining history from patient or surrogate, ordering and performing treatments and interventions, ordering and review of laboratory studies, ordering and review of radiographic studies, pulse oximetry and re-evaluation of patient's condition.   Medications Ordered in ED Medications  0.9 %  sodium chloride infusion (1,000 mLs Intravenous New Bag/Given 10/07/17 1430)  vancomycin (VANCOCIN) 1,250 mg in sodium chloride 0.9 % 250 mL IVPB (1,250 mg Intravenous New Bag/Given 10/07/17 1430)  piperacillin-tazobactam (ZOSYN) IVPB 3.375 g (not administered)  vancomycin (VANCOCIN) IVPB 1000 mg/200 mL premix (not administered)  piperacillin-tazobactam (ZOSYN) IVPB 3.375 g (3.375 g Intravenous New Bag/Given 10/07/17 1430)  ondansetron (ZOFRAN) injection 4 mg (4 mg Intravenous Given 10/07/17 1430)  sodium chloride 0.9 % bolus 500 mL (500 mLs Intravenous Bolus 10/07/17  1402)     Initial Impression / Assessment and Plan / ED Course  I have reviewed the triage vital signs and the nursing notes.  Pertinent labs & imaging results that were available during my care of the patient were reviewed by me and considered in my medical decision making (see chart for details).    Patient upon arrival met sepsis criteria with the fever of 101.  Elevated white blood cell count and tachypnea.  Respiratory rate over 20.  Patient started on sepsis protocol.  Patient has a history of allergies to cephalosporins however that just nausea and vomiting so not true allergy.  The patient was treated with Zosyn and vancomycin.  Patient not hypotensive not tachycardic.  Oxygen saturations were okay.  Expected to find a urinary tract infection.  But urine was not consistent with UTI.  However chest x-ray showed right upper lobe pneumonia.  Patient has not been in a nursing home or been admitted to the hospital in the last 3 months.  So this is a community-acquired pneumonia.  Patient is a DO NOT RESUSCITATE.  Patient given gentle IV fluids here.  Discussed with hospitalist they will admit.   Final Clinical Impressions(s) / ED Diagnoses   Final diagnoses:  Community acquired pneumonia of right upper lobe of lung ([place])  Dementia without behavioral disturbance, unspecified dementia type    ED Discharge Orders    None       Fredia Sorrow, MD 10/07/17 1610

## 2017-10-07 NOTE — H&P (Signed)
History and Physical    [name] DOB: 27-May-1927 DOA: 10/07/2017  PCP: Lance Drown, MD Consultants:  None Patient coming from:  Home - lives with grandson [name], has full-time caregivers; NOKAzzie Orozco, [phone]  Chief Complaint: weakness  HPI: [name] is a 83 y.o. male with medical history significant of DVT on Eliquis and mild dementia presenting with weakness.  History was obtained from his daughter.  "In the last 3 days, he has started falling off.  Not alert.  Didn't want to walk.  Didn't care about going to the bathroom, where he went to the bathroom it did not matter."  Since being in the ER, he is more alert than he has been in 3 days.  "The only thing he will drink is [name] and Co-Cola".  +incontinence x 3 days.  +urinary odor.  Dr. Wolfgang Orozco did a home visit today and recommended admission, was concerned about PNA and dehydration.  He has had congestion, rhinorrhea, and cough x 3 days.  They did not check his temperature.  He is also complaining of his right leg hurting.  He has a h/o DVT in his left leg and is on Eliquis, but the right leg pain in the thigh region is new.   ED Course: Sepsis, fever 101, leukocytosis, tachypnea.  Given Zosyn and Vanc.  CXR with RUL PNA.    Review of Systems: Unable to perform  Ambulatory Status:  Ambulates with a walker  Past Medical History:  Diagnosis Date  . Arthritis, hip   . Assistance needed for mobility    walks with walker  . DVT (deep venous thrombosis) ([facility]) 2016  . Mild dementia   . Never smoked any substance   . Pneumonia 2002, 2010    Past Surgical History:  Procedure Laterality Date  . APPENDECTOMY     At age 52  . HIP SURGERY     left  . TONSILLECTOMY     As a child    Social History   Socioeconomic History  . Marital status: Married    Spouse name: Not on file  . Number of children: Not on file  . Years of education: Not on file  . Highest education level: Not  on file  Social Needs  . Financial resource strain: Not on file  . Food insecurity - worry: Not on file  . Food insecurity - inability: Not on file  . Transportation needs - medical: Not on file  . Transportation needs - non-medical: Not on file  Occupational History  . 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 Smoking status: Never Smoker  . Smokeless tobacco: Never Used  Substance and Sexual Activity  . Alcohol use: No  . Drug use: No  . Sexual activity: Not on file  Other Topics Concern  . Not on file  Social History Narrative   No regular exercise    Allergies  Allergen Reactions  . Ciprofloxacin Other (See Comments)    Reaction:  Neurological changes   . Keflex [Cephalexin] Nausea And Vomiting  . Sulfa Antibiotics Other (See Comments)    Reaction:  Unknown     History reviewed. No pertinent family history.  Prior to Admission medications   Medication Sig Start Date End Date Taking? Authorizing Provider  ALPRAZolam Duanne Moron) 0.5 MG tablet TAKE ONE TABLET BY MOUTH AT BEDTIME AS NEEDED. 04/27/17   Lance Drown, MD  calcium carbonate (TUMS EX)  750 MG chewable tablet Chew 2 tablets by mouth every 2 (two) hours as needed for heartburn.    [provider]  ELIQUIS 2.5 MG TABS tablet TAKE ONE TABLET BY MOUTH TWICE DAILY. 11/27/16   Lance Drown, MD  Gentamicin Sulfate (GENTAMICIN FORTIFIED) 7 ml SOLN Gentamicin eyedrops 2 drops left eye 3 times daily for the next 5 days 06/28/17   Lance Drown, MD  nitroGLYCERIN (NITROSTAT) 0.4 MG SL tablet Place 1 tablet (0.4 mg total) under the tongue every 5 (five) minutes as needed for chest pain. 06/03/17   Lance Drown, MD  olopatadine (PATANOL) 0.1 % ophthalmic solution Place 1 drop into both eyes 2 (two) times daily. 06/25/17   Lance Drown, MD    Physical Exam: Vitals:   10/07/17 1630 10/07/17 1700 10/07/17 1900 10/07/17 2022  BP: (!) 146/69 138/69 (!) 149/80   Pulse: 66 70 82   Resp: (!)  25 (!) 22 20   Temp:   100.1 F (37.8 C)   TempSrc:   Oral   SpO2: 92% 97% 91% (!) 87%  Weight:   79.8 kg (175 lb 14.4 oz)   Height:   6\' 1"  (1.854 m)      General: Appears calm and comfortable and is NAD; he is somnolent and minimally responsive Eyes:  normal lids, iris ENT:  grossly normal hearing, lips & tongue, mmm; appropriate dentition Neck:  no LAD, masses or thyromegaly Cardiovascular:  RRR, no m/r/g. 2-3+ chronic LE edema.  Respiratory:  Diffuse right-sided rhonchi with upper airway noise interfering with exam. Increased respiratory effort. Abdomen:  soft, NT, ND, NABS Skin: Suboptimal hygiene with apparent lichenified fungal infection on the feet and scrotum Musculoskeletal:  No apparent R hip deformity but patient does appear to be localizing pain to the R hip, thigh Psychiatric: somnolent and minimally responsive Neurologic: unable to assess    Radiological Exams on Admission: Dg Chest Port 1 View  Result Date: 10/07/2017 CLINICAL DATA:  Fever, dementia EXAM: PORTABLE CHEST 1 VIEW COMPARISON:  CT angio chest of 02/28/2017 and chest x-ray of 02/24/2016 FINDINGS: There is new parenchymal opacity within the right mid upper lung field most consistent with pneumonia and consolidation. Small right pleural effusion cannot be excluded. The heart is borderline enlarged. No acute bony abnormality is seen. IMPRESSION: 1. Consolidation within the right upper mid lung most consistent with pneumonia with possible small right effusion. 2. Stable borderline cardiomegaly. Electronically Signed   By: Ivar Drape M.D.   On: 10/07/2017 14:28    EKG: Independently reviewed.  NSR with rate 96; nonspecific ST changes which are more pronounced than prior in II, III, aVF concerning for inferior acute ischemia - although there are a number of blocks, artifact and it also could be rate related   Labs on Admission: I have personally reviewed the available labs and imaging studies at the time of the  admission.  Pertinent labs:   Glucose 124 BUN 25/Creatinine 1.35/GFR 45; 24/1.19/53 on 02/28/17 Albumin 2.5; 4.1 on 4/1 AST 79/ALT 72; normal on 4/1 Lactate 2.0, 1.6 Procalcitonin 0.55 WBC 15.3 UA: small Hgb, protein 100  Assessment/Plan Principal Problem:   Sepsis due to pneumonia [facility]) Active Problems:   Alzheimer's disease   Abnormal EKG   Hip pain   Sepsis due to PNA -Elevated WBC count, fever, tachypnea with elevated lactate  -While awaiting blood cultures, this appears to be a preseptic condition. -Sepsis protocol initiated -Given productive cough, fever, mildly decreased oxygen saturation,  and infiltrate in right middle lobe on chest x-ray , most likely community-acquired pneumonia.  - Other etiologies include aspiration (if altered mental status was more severe than described) versus URI (most likely viral). -Influenza pending. -CURB-65 score is 3+, meaning that the patient has a 14.5% mortality risk. -Pneumonia Severity Index (PSI) is Class 4, 9% mortality. -Corticosteroids have been to shown to low overall mortality rate; risk of ARDS; and need for mechanical ventilation.  This is particularly true in severe PNA (class 3+ PSI).  Will add 40 mg prednisone daily for 3-7 days. -He received Zosyn/Vanc in the ER but will change to  Azithromycin 500 mg daily AND Rocephin due to no risk factors for MDR cause. -Additional complicating factors include: hypoxia; worsening of co-morbid condition (dementia). -NS @ 75cc/hr -Fever control -Repeat CBC in am -Sputum cultures -Blood cultures -Strep pneumo testing -Will order lower respiratory tract procalcitonin level.  Antibiotics would not be indicated for PCT <0.1 and probably should not be used for < 0.25.  >0.5 indicates infection and >>0.5 indicates more serious disease.  As the procalcitonin level normalizes, it will be reasonable to consider de-escalation of antibiotic coverage. -Blood cultures pending -Will admit and continue  to monitor  Abnormal EKG -Patient discussed with cardiologist on call -He does not have a good story for STEMI but also has dementia and so history is challenging -EKG reviewed and while there is prominence in II, III, and aVL, this is thought to be a repolarization abnormality with conduction disease at baseline -Will order stat troponin now and trend troponins overnight with repeat EKG in the AM  Hip pain -Patient with apparent discomfort in R hip/thigh -On further questioning, his daughter acknowledges a fall 2 days ago -She knows that he scratched his left leg on his wheelchair but isn't sure how he landed -No obvious deformities on exam -Will order hip x-ray to evaluate for fracture - although the patient is not a great surgical candidate at this time  Dementia -Exacerbated by current illness -Will follow  DVT prophylaxis: Lovenox  Code Status:  DNR - confirmed with family Family Communication: Daughter present throughout evaluation Disposition Plan: To be determined Consults called: None Admission status: Admit - It is my clinical opinion that admission to INPATIENT is reasonable and necessary because this patient will require at least 2 midnights in the hospital to treat this condition based on the medical complexity of the problems presented.  Given the aforementioned information, the predictability of an adverse outcome is felt to be significant.    Karmen Bongo MD [facility]  If note is complete, please contact covering daytime or nighttime physician. [email or web address] Password [facility]  10/07/2017, 9:17 PM

## 2017-10-07 NOTE — Progress Notes (Addendum)
ANTIBIOTIC CONSULT NOTE - INITIAL  Pharmacy Consult for vancomycin and zosyn Indication: sepsis  Allergies  Allergen Reactions  . Ciprofloxacin Other (See Comments)    Reaction:  Neurological changes   . Keflex [Cephalexin] Nausea And Vomiting  . Sulfa Antibiotics Other (See Comments)    Reaction:  Unknown     Patient Measurements: Height: 6\' 1"  (185.4 cm) Weight: 160 lb (72.6 kg) IBW/kg (Calculated) : 79.9  Vital Signs: Temp: 101.2 F (38.4 C) (11/08 1332) Temp Source: Oral (11/08 1332) BP: 147/72 (11/08 1500) Pulse Rate: 75 (11/08 1500) Intake/Output from previous day: No intake/output data recorded. Intake/Output from this shift: Total I/O In: 1000 [I.V.:1000] Out: -   Labs: Recent Labs    10/07/17 1400 10/07/17 1417  WBC 15.3*  --   HGB 12.3*  --   PLT 208  --   CREATININE  --  1.35*   Estimated Creatinine Clearance: 38.8 mL/min (A) (by C-G formula based on SCr of 1.35 mg/dL (H)). No results for input(s): VANCOTROUGH, VANCOPEAK, VANCORANDOM, GENTTROUGH, GENTPEAK, GENTRANDOM, TOBRATROUGH, TOBRAPEAK, TOBRARND, AMIKACINPEAK, AMIKACINTROU, AMIKACIN in the last 72 hours.   Microbiology: No results found for this or any previous visit (from the past 720 hour(s)).  Medical History: Past Medical History:  Diagnosis Date  . Arthritis, hip   . Assistance needed for mobility    walks with walker  . DVT (deep venous thrombosis) ([place])   . Mild dementia   . Never smoked any substance   . Pneumonia 2002, 2010    Medications:  See medication history Assessment: 83 yo man to start vancomycin and zosyn for sepsis.  Initial dose of zosyn ordered in the ED  Goal of Therapy:  Vancomycin trough level 15-20 mcg/ml  Plan:  Vancomycin 1250 mg IV x 1 then 1gm IV q24 hours Continue zosyn 3.375 gm IV q8 hours  Seay, Lora Poteet 10/07/2017,3:06 PM   Changed to Rocephin on admission. 1gm IV every 24 hours. Dose stable for age, weight, renal function and  indication. No pharmacokinetic monitoring needed. Cephalexin allergy noted. Received Rocephin and other PCN derivatives on previous visits Sign off.  Pricilla Larsson, [facility] 10/07/2017 10:22 PM

## 2017-10-08 ENCOUNTER — Inpatient Hospital Stay (HOSPITAL_COMMUNITY): Payer: Medicare Other

## 2017-10-08 DIAGNOSIS — R9431 Abnormal electrocardiogram [ECG] [EKG]: Secondary | ICD-10-CM

## 2017-10-08 DIAGNOSIS — E876 Hypokalemia: Secondary | ICD-10-CM

## 2017-10-08 DIAGNOSIS — G301 Alzheimer's disease with late onset: Secondary | ICD-10-CM

## 2017-10-08 DIAGNOSIS — F0280 Dementia in other diseases classified elsewhere without behavioral disturbance: Secondary | ICD-10-CM

## 2017-10-08 LAB — CBC WITH DIFFERENTIAL/PLATELET
Basophils Absolute: 0 10*3/uL (ref 0.0–0.1)
Basophils Relative: 0 %
EOS ABS: 0 10*3/uL (ref 0.0–0.7)
EOS PCT: 0 %
HCT: 36.3 % — ABNORMAL LOW (ref 39.0–52.0)
HEMOGLOBIN: 11.6 g/dL — AB (ref 13.0–17.0)
LYMPHS ABS: 0.9 10*3/uL (ref 0.7–4.0)
LYMPHS PCT: 7 %
MCH: 28 pg (ref 26.0–34.0)
MCHC: 32 g/dL (ref 30.0–36.0)
MCV: 87.7 fL (ref 78.0–100.0)
MONOS PCT: 5 %
Monocytes Absolute: 0.7 10*3/uL (ref 0.1–1.0)
NEUTROS PCT: 88 %
Neutro Abs: 11.5 10*3/uL — ABNORMAL HIGH (ref 1.7–7.7)
Platelets: 195 10*3/uL (ref 150–400)
RBC: 4.14 MIL/uL — ABNORMAL LOW (ref 4.22–5.81)
RDW: 14.7 % (ref 11.5–15.5)
WBC: 13.2 10*3/uL — ABNORMAL HIGH (ref 4.0–10.5)

## 2017-10-08 LAB — BASIC METABOLIC PANEL
Anion gap: 10 (ref 5–15)
BUN: 24 mg/dL — AB (ref 6–20)
CHLORIDE: 107 mmol/L (ref 101–111)
CO2: 25 mmol/L (ref 22–32)
CREATININE: 1.2 mg/dL (ref 0.61–1.24)
Calcium: 7.9 mg/dL — ABNORMAL LOW (ref 8.9–10.3)
GFR calc Af Amer: >60 mL/min (ref >60)
GFR calc non Af Amer: 52 mL/min — ABNORMAL LOW (ref >60)
Glucose, Bld: 141 mg/dL — ABNORMAL HIGH (ref 65–99)
POTASSIUM: 3.6 mmol/L (ref 3.5–5.1)
Sodium: 142 mmol/L (ref 135–145)

## 2017-10-08 LAB — TROPONIN I
TROPONIN I: 0.03 ng/mL — AB (ref <0.03)
Troponin I: 0.03 ng/mL (ref <0.03)

## 2017-10-08 LAB — STREP PNEUMONIAE URINARY ANTIGEN: Strep Pneumo Urinary Antigen: NEGATIVE

## 2017-10-08 NOTE — Evaluation (Signed)
Physical Therapy Evaluation Patient Details Name: ANTOIN Orozco MRN: [id number] DOB: Apr 22, 1927 Today's Date: 10/08/2017   History of Present Illness  [name] is a 83 y.o. male with medical history significant of DVT on Eliquis and mild dementia presenting with weakness.  History was obtained from his daughter.  "In the last 3 days, he has started falling off.  Not alert.  Didn't want to walk.  Didn't care about going to the bathroom, where he went to the bathroom it did not matter."  Since being in the ER, he is more alert than he has been in 3 days.  "The only thing he will drink is [name] and Co-Cola".  +incontinence x 3 days.  +urinary odor.  Dr. Wolfgang Phoenix did a home visit today and recommended admission, was concerned about PNA and dehydration.  He has had congestion, rhinorrhea, and cough x 3 days.  They did not check his temperature.  He is also complaining of his right leg hurting.  He has a h/o DVT in his left leg and is on Eliquis, but the right leg pain in the thigh region is new.    Clinical Impression  Patient c/o severe pain with movement of RLE, required constant verbal/tactile cueing to participate, has tendency to hang on to side rails when attempting function tasks with fair/poor carryover for following instructions.  Patient will benefit from continued physical therapy in hospital and recommended venue below to increase strength, balance, endurance for safe ADLs and gait.    Follow Up Recommendations SNF    Equipment Recommendations  None recommended by PT    Recommendations for Other Services       Precautions / Restrictions Precautions Precautions: Fall Restrictions Weight Bearing Restrictions: No      Mobility  Bed Mobility Overal bed mobility: Needs Assistance Bed Mobility: Supine to Sit;Sit to Supine     Supine to sit: Max assist Sit to supine: Max assist      Transfers Overall transfer level: Needs assistance Equipment used: Rolling walker (2  wheeled) Transfers: Sit to/from Stand Sit to Stand: Max assist;Total assist         General transfer comment: Max/total to left bottom off bed  Ambulation/Gait                Stairs            Wheelchair Mobility    Modified Rankin (Stroke Patients Only)       Balance Overall balance assessment: Needs assistance Sitting-balance support: Bilateral upper extremity supported;Feet supported Sitting balance-Leahy Scale: Poor     Standing balance support: Bilateral upper extremity supported;During functional activity Standing balance-Leahy Scale: Poor                               Pertinent Vitals/Pain Pain Assessment: Faces Faces Pain Scale: Hurts even more Pain Location: right knee Pain Descriptors / Indicators: Sharp Pain Intervention(s): Limited activity within patient's tolerance;Monitored during session    Home Living Family/patient expects to be discharged to:: Private residence Living Arrangements: Children Available Help at Discharge: Family Type of Home: House Home Access: Stairs to enter Entrance Stairs-Rails: Right Entrance Stairs-Number of Steps: 2 to get on porch Home Layout: One level Home Equipment: Environmental consultant - 2 wheels;Bedside commode;Shower seat;Wheelchair - manual      Prior Function Level of Independence: Independent with assistive device(s)  Hand Dominance        Extremity/Trunk Assessment   Upper Extremity Assessment Upper Extremity Assessment: Generalized weakness    Lower Extremity Assessment Lower Extremity Assessment: Generalized weakness    Cervical / Trunk Assessment Cervical / Trunk Assessment: Normal  Communication   Communication: No difficulties  Cognition Arousal/Alertness: Awake/alert Behavior During Therapy: WFL for tasks assessed/performed Overall Cognitive Status: History of cognitive impairments - at baseline                                         General Comments      Exercises     Assessment/Plan    PT Assessment Patient needs continued PT services  PT Problem List Decreased strength;Decreased activity tolerance;Decreased balance;Decreased mobility       PT Treatment Interventions Gait training;Functional mobility training;Stair training;Therapeutic activities;Therapeutic exercise;Patient/family education    PT Goals (Current goals can be found in the Care Plan section)  Acute Rehab PT Goals Patient Stated Goal: return home PT Goal Formulation: With patient/family Time For Goal Achievement: 10/22/17 Potential to Achieve Goals: Fair    Frequency Min 3X/week   Barriers to discharge        Co-evaluation               AM-PAC PT "6 Clicks" Daily Activity  Outcome Measure Difficulty turning over in bed (including adjusting bedclothes, sheets and blankets)?: A Lot Difficulty moving from lying on back to sitting on the side of the bed? : A Lot Difficulty sitting down on and standing up from a chair with arms (e.g., wheelchair, bedside commode, etc,.)?: Unable Help needed moving to and from a bed to chair (including a wheelchair)?: Total Help needed walking in hospital room?: Total Help needed climbing 3-5 steps with a railing? : Total 6 Click Score: 8    End of Session Equipment Utilized During Treatment: Gait belt Activity Tolerance: Patient limited by pain;Patient limited by fatigue Patient left: in bed;with call bell/phone within reach;with bed alarm set;with family/visitor present Nurse Communication: Mobility status PT Visit Diagnosis: Unsteadiness on feet (R26.81);Other abnormalities of gait and mobility (R26.89);Muscle weakness (generalized) (M62.81)    Time: 6160-7371 PT Time Calculation (min) (ACUTE ONLY): 30 min   Charges:   PT Evaluation $PT Eval Moderate Complexity: 1 Mod PT Treatments $Therapeutic Activity: 8-22 mins   PT G Codes:   PT G-Codes **NOT FOR INPATIENT CLASS** Functional  Assessment Tool Used: AM-PAC 6 Clicks Basic Mobility Functional Limitation: Mobility: Walking and moving around Mobility: Walking and Moving Around Current Status (G6269): At least 80 percent but less than 100 percent impaired, limited or restricted Mobility: Walking and Moving Around Goal Status [phone]): At least 80 percent but less than 100 percent impaired, limited or restricted Mobility: Walking and Moving Around Discharge Status [phone]): At least 80 percent but less than 100 percent impaired, limited or restricted    3:36 PM, 10/08/17 Lonell Grandchild, MPT Physical Therapist with [facility] [phone] office [phone] mobile phone

## 2017-10-08 NOTE — Progress Notes (Signed)
PROGRESS NOTE  [name]  YDX:[id number]  DOB: Apr 17, 1927  DOA: 10/07/2017 PCP: Kathyrn Drown, MD   Brief Admission Hx: TIA [name] is a 83 y.o. male with medical history significant of DVT on Eliquis and mild dementia presenting with weakness.  He was found to have pneumonia.    MDM/Assessment & Plan:   1. Community Acquired Pneumonia - continue Iv antibiotics and supportive care.  Clinically he is already showing some improvement from family. I do worry about aspiration and I would like to have a speech therapy evaluation completed.  Follow cultures.  Influenza testing negative.  2. Prerenal AKI - improving with IVF hydration.  Following.  3. Leukocytosis - secondary to #1, improving with proper treatment.  4. Hypokalemia - repleted. Follow.  5. Dementia - encouraged family to stay with him at bedside as much as possible.  6. Hip pain - degenerative changes noted on xrays, no acute findings.  PT evaluation.  7. Elevated liver enzymes - recheck in AM.   DVT prophylaxis: lovenox Code Status: DNR Family Communication: son bedside Disposition Plan: TBD  Subjective: Pt demented.    Objective: Vitals:   10/07/17 1700 10/07/17 1900 10/07/17 2022 10/07/17 2147  BP: 138/69 (!) 149/80  (!) 148/66  Pulse: 70 82  75  Resp: (!) 22 20  20   Temp:  100.1 F (37.8 C)  98.7 F (37.1 C)  TempSrc:  Oral  Oral  SpO2: 97% 91% (!) 87% 91%  Weight:  79.8 kg (175 lb 14.4 oz)    Height:  6\' 1"  (1.854 m)      Intake/Output Summary (Last 24 hours) at 10/08/2017 1014 Last data filed at 10/08/2017 0600 Gross per 24 hour  Intake 1420 ml  Output -  Net 1420 ml   Filed Weights   10/07/17 1330 10/07/17 1900  Weight: 72.6 kg (160 lb) 79.8 kg (175 lb 14.4 oz)     REVIEW OF SYSTEMS  UTO: demented  Exam:  General exam: awake, alert, NAD. Cooperative.  Respiratory system: shallow BS diminished RLL. No increased work of breathing. Cardiovascular system: S1 & S2 heard normal. No JVD,  murmurs, gallops, clicks or pedal edema. Gastrointestinal system: Abdomen is nondistended, soft and nontender. Normal bowel sounds heard. Central nervous system: Alert. Demented. No focal neurological deficits. Extremities: no CCE.  Data Reviewed: Basic Metabolic Panel: Recent Labs  Lab 10/07/17 1417 10/08/17 0308  NA 139 142  K 3.3* 3.6  CL 102 107  CO2 26 25  GLUCOSE 124* 141*  BUN 25* 24*  CREATININE 1.35* 1.20  CALCIUM 7.9* 7.9*   Liver Function Tests: Recent Labs  Lab 10/07/17 1417  AST 79*  ALT 72*  ALKPHOS 113  BILITOT 0.9  PROT 6.9  ALBUMIN 2.5*   No results for input(s): LIPASE, AMYLASE in the last 168 hours. No results for input(s): AMMONIA in the last 168 hours. CBC: Recent Labs  Lab 10/07/17 1400 10/08/17 0308  WBC 15.3* 13.2*  NEUTROABS 13.2* 11.5*  HGB 12.3* 11.6*  HCT 39.7 36.3*  MCV 88.8 87.7  PLT 208 195   Cardiac Enzymes: Recent Labs  Lab 10/07/17 2117 10/08/17 0308  TROPONINI 0.04* 0.03*   CBG (last 3)  No results for input(s): GLUCAP in the last 72 hours. Recent Results (from the past 240 hour(s))  Blood Culture (routine x 2)     Status: None (Preliminary result)   Collection Time: 10/07/17  2:16 PM  Result Value Ref Range Status   Specimen  Description RIGHT ANTECUBITAL  Final   Special Requests   Final    BOTTLES DRAWN AEROBIC ONLY Blood Culture results may not be optimal due to an inadequate volume of blood received in culture bottles   Culture NO GROWTH < 24 HOURS  Final   Report Status PENDING  Incomplete  Blood Culture (routine x 2)     Status: None (Preliminary result)   Collection Time: 10/07/17  2:17 PM  Result Value Ref Range Status   Specimen Description LEFT ANTECUBITAL  Final   Special Requests   Final    BOTTLES DRAWN AEROBIC AND ANAEROBIC Blood Culture adequate volume   Culture NO GROWTH < 24 HOURS  Final   Report Status PENDING  Incomplete     Studies: Dg Chest Port 1 View  Result Date: 10/07/2017 CLINICAL  DATA:  Fever, dementia EXAM: PORTABLE CHEST 1 VIEW COMPARISON:  CT angio chest of 02/28/2017 and chest x-ray of 02/24/2016 FINDINGS: There is new parenchymal opacity within the right mid upper lung field most consistent with pneumonia and consolidation. Small right pleural effusion cannot be excluded. The heart is borderline enlarged. No acute bony abnormality is seen. IMPRESSION: 1. Consolidation within the right upper mid lung most consistent with pneumonia with possible small right effusion. 2. Stable borderline cardiomegaly. Electronically Signed   By: Ivar Drape M.D.   On: 10/07/2017 14:28   Dg Hip Port Unilat With Pelvis 1v Right  Result Date: 10/07/2017 CLINICAL DATA:  Chronic right hip pain.  No known injury. EXAM: DG HIP (WITH OR WITHOUT PELVIS) 1V PORT RIGHT COMPARISON:  None. FINDINGS: Postoperative changes with left hip arthroplasty. Arthroplasty components are incompletely visualized within the field of view. The pelvis appears intact. SI joints and symphysis pubis are not displaced. Degenerative changes in the lower lumbar spine. Degenerative changes in the right hip with narrowing and sclerosis of the acetabular joint and small osteophytes on both sides of the joint. No evidence of acute fracture or dislocation of the right hip. Soft tissues are unremarkable. IMPRESSION: Degenerative changes in the lower lumbar spine and right hip. Left hip arthroplasty. No acute bony abnormalities. Electronically Signed   By: Lucienne Capers M.D.   On: 10/07/2017 22:56     Scheduled Meds: . apixaban  2.5 mg Oral BID  . olopatadine  1 drop Both Eyes BID  . predniSONE  40 mg Oral Q breakfast   Continuous Infusions: . sodium chloride 75 mL/hr at 10/08/17 0504  . azithromycin Stopped (10/07/17 2358)  . cefTRIAXone (ROCEPHIN)  IV Stopped (10/08/17 0124)   Principal Problem:   Sepsis due to pneumonia [facility]) Active Problems:   Alzheimer's disease   Abnormal EKG   Hip pain  Time spent:   Irwin Brakeman, MD, FAAFP [facility] Pager [phone]  If 7PM-7AM, please contact night-coverage [email or web address] Password [email or web address] 10/08/2017, 10:14 AM    LOS: 1 day

## 2017-10-08 NOTE — Evaluation (Addendum)
Clinical/Bedside Swallow Evaluation Patient Details  Name: [name] MRN: [id number] Date of Birth: [birth year]  Today's Date: 10/08/2017 Time: SLP Start Time (ACUTE ONLY): 89 SLP Stop Time (ACUTE ONLY): 4403 SLP Time Calculation (min) (ACUTE ONLY): 24 min  Past Medical History:  Past Medical History:  Diagnosis Date  . Arthritis, hip   . Assistance needed for mobility    walks with walker  . DVT (deep venous thrombosis) ([place]) 2016  . Mild dementia   . Never smoked any substance   . Pneumonia 2002, 2010   Past Surgical History:  Past Surgical History:  Procedure Laterality Date  . APPENDECTOMY     At age 63  . HIP SURGERY     left  . TONSILLECTOMY     As a child   HPI:  Pt is an 83 y.o. male with PMH of DVT on Eliquis and mild dementia presenting with weakness on 11/8. History was obtained from his daughter. "In the last 3 days, he has started falling off. Not alert. Didn't want to walk. Didn't care about going to the bathroom, where he went to the bathroom it did not matter."Admitted with Sepsis, fever 101, leukocytosis, tachypnea. CXR showed RUL PNA. Concern for aspiration. Bedside swallow eval ordered.    Assessment / Plan / Recommendation Clinical Impression  Pt showed no overt s/s of aspiration during evaluation but did have a subtle change in vocal quality (wet) following sips of thin liquid at end of evaluation. Prior to providing any PO intake, pt had some pieces of dinner remaining in oral cavity; oral care provided. Family at bedside indicated that pt's cognitive status is improving but not at baseline, which increases risk of aspiration. Pt followed some 1-step commands and answered a few simple questions but also some language of confusion. Needed some assistance with feeding. Family indicated that at baseline pt is able to feed self and did not recall any baseline difficulty withs swallowing. RN reported pt did have a coughing episode earlier today when  drinking. Recommend downgrading diet to dysphagia 3/ thin liquids, meds crushed in puree, minimize distractions during meal, encourage small bites/ sips. Will f/u to ensure diet tolerance/ consider need for objective evaluation. SLP Visit Diagnosis: Dysphagia, unspecified (R13.10)    Aspiration Risk  Mild aspiration risk;Moderate aspiration risk    Diet Recommendation Dysphagia 3 (Mech soft);Thin liquid   Liquid Administration via: Cup;Straw Medication Administration: Whole meds with liquid Supervision: Staff to assist with self feeding;Full supervision/cueing for compensatory strategies Compensations: Minimize environmental distractions;Slow rate;Small sips/bites Postural Changes: Seated upright at 90 degrees    Other  Recommendations Oral Care Recommendations: Oral care BID   Follow up Recommendations Other (comment)(TBD)      Frequency and Duration min 1 x/week  1 week       Prognosis Prognosis for Safe Diet Advancement: Good Barriers to Reach Goals: Cognitive deficits      Swallow Study   General HPI: Pt is an 83 y.o. male with PMH of DVT on Eliquis and mild dementia presenting with weakness on 11/8. History was obtained from his daughter. "In the last 3 days, he has started falling off. Not alert. Didn't want to walk. Didn't care about going to the bathroom, where he went to the bathroom it did not matter."Admitted with Sepsis, fever 101, leukocytosis, tachypnea. CXR showed RUL PNA. Concern for aspiration. Bedside swallow eval ordered.  Type of Study: Bedside Swallow Evaluation Previous Swallow Assessment: none in chart Diet Prior to this Study: Regular;Thin  liquids Temperature Spikes Noted: Yes Respiratory Status: Room air History of Recent Intubation: No Behavior/Cognition: Alert;Cooperative;Confused;Requires cueing Oral Cavity Assessment: Dry Oral Care Completed by SLP: Yes Oral Cavity - Dentition: Adequate natural dentition Self-Feeding Abilities: Needs  assist Patient Positioning: Upright in bed Baseline Vocal Quality: Low vocal intensity Volitional Cough: Cognitively unable to elicit Volitional Swallow: Unable to elicit    Oral/Motor/Sensory Function Overall Oral Motor/Sensory Function: Within functional limits   Ice Chips Ice chips: Not tested   Thin Liquid Thin Liquid: Impaired Presentation: Cup;Straw Pharyngeal  Phase Impairments: Wet Vocal Quality    Nectar Thick Nectar Thick Liquid: Not tested   Honey Thick Honey Thick Liquid: Not tested   Puree Puree: Within functional limits Presentation: Spoon   Solid   GO   Solid: Within functional limits Presentation: [place], MA, CCC-SLP 10/08/2017,5:57 PM

## 2017-10-08 NOTE — Plan of Care (Signed)
  Acute Rehab PT Goals(only PT should resolve) Pt Will Go Supine/Side To Sit 10/08/2017 1539 - Progressing by Lonell Grandchild, PT Flowsheets Taken 10/08/2017 1539  Pt will go Supine/Side to Sit with minimal assist Patient Will Transfer Sit To/From Stand 10/08/2017 1539 - Progressing by Lonell Grandchild, PT Flowsheets Taken 10/08/2017 1539  Patient will transfer sit to/from stand with moderate assist Pt Will Transfer Bed To Chair/Chair To Bed 10/08/2017 1539 - Progressing by Lonell Grandchild, PT Flowsheets Taken 10/08/2017 1539  Pt will Transfer Bed to Chair/Chair to Bed with mod assist Pt Will Ambulate 10/08/2017 1539 - Progressing by Lonell Grandchild, PT Flowsheets Taken 10/08/2017 1539  Pt will Ambulate 25 feet;with moderate assist;with rolling walker  3:40 PM, 10/08/17 Lonell Grandchild, MPT Physical Therapist with [facility] [phone] office [phone] mobile phone

## 2017-10-09 DIAGNOSIS — J189 Pneumonia, unspecified organism: Secondary | ICD-10-CM | POA: Diagnosis present

## 2017-10-09 DIAGNOSIS — R296 Repeated falls: Secondary | ICD-10-CM

## 2017-10-09 LAB — URINE CULTURE: Culture: NO GROWTH

## 2017-10-09 NOTE — Plan of Care (Signed)
Pt is progressing 

## 2017-10-09 NOTE — Progress Notes (Signed)
PROGRESS NOTE  CHARELS Orozco  YCX:[id number]  DOB: 1927/10/07  DOA: 10/07/2017 PCP: Kathyrn Drown, MD   Brief Admission Hx: [name] is a 83 y.o. male with medical history significant of DVT on Eliquis and mild dementia presenting with weakness.  He was found to have pneumonia.    MDM/Assessment & Plan:   1. Community Acquired Pneumonia - continue Iv antibiotics and supportive care.  Clinically he is already showing some improvement from family. I do worry about aspiration and speech therapy evaluation completed and he has been placed on Dys3 diet.  Follow cultures.  Influenza testing negative.  2. Prerenal AKI - continue IVF hydration. Check BMP in AM.   3. Leukocytosis - secondary to #1, check CBC in AM.  4. Falls - PT recommending SNF, consulted social worker. Family in agreement.  5. Hypokalemia - repleted. Follow.  6. Dementia - encouraged family to stay with him at bedside as much as possible.  7. Hip pain - degenerative changes noted on xrays, no acute findings.  PT evaluation.  8. Elevated liver enzymes - recheck in AM.   DVT prophylaxis: lovenox Code Status: DNR Family Communication: son bedside Disposition Plan: SNF  Subjective: Pt demented.    Objective: Vitals:   10/07/17 2147 10/08/17 1400 10/08/17 2100 10/09/17 0622  BP: (!) 148/66 (!) 150/79 137/71 139/75  Pulse: 75 71 62 67  Resp: 20 20 18 18   Temp: 98.7 F (37.1 C) 98.7 F (37.1 C) 98.2 F (36.8 C) 98.1 F (36.7 C)  TempSrc: Oral Oral Oral Oral  SpO2: 91% 96% 93% 94%  Weight:      Height:        Intake/Output Summary (Last 24 hours) at 10/09/2017 0924 Last data filed at 10/08/2017 1730 Gross per 24 hour  Intake 723.75 ml  Output -  Net 723.75 ml   Filed Weights   10/07/17 1330 10/07/17 1900  Weight: 72.6 kg (160 lb) 79.8 kg (175 lb 14.4 oz)   REVIEW OF SYSTEMS  UTO: demented  Exam:  General exam: awake, alert, NAD. Cooperative.  Respiratory system: shallow BS diminished RLL. No  increased work of breathing. Cardiovascular system: S1 & S2 heard normal. No JVD, murmurs, gallops, clicks or pedal edema. Gastrointestinal system: Abdomen is nondistended, soft and nontender. Normal bowel sounds heard. Central nervous system: Alert. Demented. No focal neurological deficits. Extremities: no CCE.  Data Reviewed: Basic Metabolic Panel: Recent Labs  Lab 10/07/17 1417 10/08/17 0308  NA 139 142  K 3.3* 3.6  CL 102 107  CO2 26 25  GLUCOSE 124* 141*  BUN 25* 24*  CREATININE 1.35* 1.20  CALCIUM 7.9* 7.9*   Liver Function Tests: Recent Labs  Lab 10/07/17 1417  AST 79*  ALT 72*  ALKPHOS 113  BILITOT 0.9  PROT 6.9  ALBUMIN 2.5*   No results for input(s): LIPASE, AMYLASE in the last 168 hours. No results for input(s): AMMONIA in the last 168 hours. CBC: Recent Labs  Lab 10/07/17 1400 10/08/17 0308  WBC 15.3* 13.2*  NEUTROABS 13.2* 11.5*  HGB 12.3* 11.6*  HCT 39.7 36.3*  MCV 88.8 87.7  PLT 208 195   Cardiac Enzymes: Recent Labs  Lab 10/07/17 2117 10/08/17 0308 10/08/17 0859 10/08/17 1549  TROPONINI 0.04* 0.03* 0.03* <0.03   CBG (last 3)  No results for input(s): GLUCAP in the last 72 hours. Recent Results (from the past 240 hour(s))  Blood Culture (routine x 2)     Status: None (Preliminary result)  Collection Time: 10/07/17  2:16 PM  Result Value Ref Range Status   Specimen Description RIGHT ANTECUBITAL  Final   Special Requests   Final    BOTTLES DRAWN AEROBIC ONLY Blood Culture results may not be optimal due to an inadequate volume of blood received in culture bottles   Culture NO GROWTH 2 DAYS  Final   Report Status PENDING  Incomplete  Blood Culture (routine x 2)     Status: None (Preliminary result)   Collection Time: 10/07/17  2:17 PM  Result Value Ref Range Status   Specimen Description LEFT ANTECUBITAL  Final   Special Requests   Final    BOTTLES DRAWN AEROBIC AND ANAEROBIC Blood Culture adequate volume   Culture NO GROWTH 2 DAYS   Final   Report Status PENDING  Incomplete     Studies: Dg Chest Port 1 View  Result Date: 10/07/2017 CLINICAL DATA:  Fever, dementia EXAM: PORTABLE CHEST 1 VIEW COMPARISON:  CT angio chest of 02/28/2017 and chest x-ray of 02/24/2016 FINDINGS: There is new parenchymal opacity within the right mid upper lung field most consistent with pneumonia and consolidation. Small right pleural effusion cannot be excluded. The heart is borderline enlarged. No acute bony abnormality is seen. IMPRESSION: 1. Consolidation within the right upper mid lung most consistent with pneumonia with possible small right effusion. 2. Stable borderline cardiomegaly. Electronically Signed   By: Ivar Drape M.D.   On: 10/07/2017 14:28   Dg Knee Right Port  Result Date: 10/08/2017 CLINICAL DATA:  83 y/o male status post fall at home with blunt trauma to the right knee and knee pain. EXAM: PORTABLE RIGHT KNEE - 1-2 VIEW COMPARISON:  Right tib-fib series 91315. FINDINGS: Small to moderate joint effusion is visible on the cross-table lateral view. Tricompartmental joint space loss and degenerative changes including osteophytosis and some chondrocalcinosis. The patella appears intact. No acute fracture or dislocation is identified. Intermittent calcified peripheral vascular disease. IMPRESSION: Joint effusion and severe degenerative changes with no acute fracture or dislocation identified. Electronically Signed   By: Genevie Ann M.D.   On: 10/08/2017 14:58   Dg Hip Port Unilat With Pelvis 1v Right  Result Date: 10/07/2017 CLINICAL DATA:  Chronic right hip pain.  No known injury. EXAM: DG HIP (WITH OR WITHOUT PELVIS) 1V PORT RIGHT COMPARISON:  None. FINDINGS: Postoperative changes with left hip arthroplasty. Arthroplasty components are incompletely visualized within the field of view. The pelvis appears intact. SI joints and symphysis pubis are not displaced. Degenerative changes in the lower lumbar spine. Degenerative changes in the right  hip with narrowing and sclerosis of the acetabular joint and small osteophytes on both sides of the joint. No evidence of acute fracture or dislocation of the right hip. Soft tissues are unremarkable. IMPRESSION: Degenerative changes in the lower lumbar spine and right hip. Left hip arthroplasty. No acute bony abnormalities. Electronically Signed   By: Lucienne Capers M.D.   On: 10/07/2017 22:56     Scheduled Meds: . apixaban  2.5 mg Oral BID  . olopatadine  1 drop Both Eyes BID  . predniSONE  40 mg Oral Q breakfast   Continuous Infusions: . sodium chloride 75 mL/hr at 10/08/17 1833  . azithromycin Stopped (10/09/17 0006)  . cefTRIAXone (ROCEPHIN)  IV Stopped (10/09/17 0116)   Principal Problem:   Sepsis due to pneumonia [facility]) Active Problems:   Alzheimer's disease   Abnormal EKG   Hip pain  Time spent:   Irwin Brakeman, MD, FAAFP [facility] Pager [phone]  If 7PM-7AM, please contact night-coverage [email or web address] Password [email or web address] 10/09/2017, 9:24 AM    LOS: 2 days

## 2017-10-09 NOTE — Progress Notes (Signed)
Pts grandson and pts daughter are taking turns caring for pt. The daughter stayed with the pt  from 10am-6pm and grandson arrived at 7pm and will spend the night. The pts family feed the pt all meals today.

## 2017-10-10 DIAGNOSIS — M25559 Pain in unspecified hip: Secondary | ICD-10-CM

## 2017-10-10 DIAGNOSIS — R296 Repeated falls: Secondary | ICD-10-CM

## 2017-10-10 LAB — CBC
HCT: 37.4 % — ABNORMAL LOW (ref 39.0–52.0)
Hemoglobin: 11.6 g/dL — ABNORMAL LOW (ref 13.0–17.0)
MCH: 27.6 pg (ref 26.0–34.0)
MCHC: 31 g/dL (ref 30.0–36.0)
MCV: 89 fL (ref 78.0–100.0)
PLATELETS: 246 10*3/uL (ref 150–400)
RBC: 4.2 MIL/uL — AB (ref 4.22–5.81)
RDW: 14.8 % (ref 11.5–15.5)
WBC: 10.3 10*3/uL (ref 4.0–10.5)

## 2017-10-10 LAB — BASIC METABOLIC PANEL
Anion gap: 7 (ref 5–15)
BUN: 27 mg/dL — AB (ref 6–20)
CHLORIDE: 110 mmol/L (ref 101–111)
CO2: 26 mmol/L (ref 22–32)
CREATININE: 0.93 mg/dL (ref 0.61–1.24)
Calcium: 7.8 mg/dL — ABNORMAL LOW (ref 8.9–10.3)
GFR calc Af Amer: >60 mL/min (ref >60)
GFR calc non Af Amer: >60 mL/min (ref >60)
Glucose, Bld: 121 mg/dL — ABNORMAL HIGH (ref 65–99)
Potassium: 3.6 mmol/L (ref 3.5–5.1)
SODIUM: 143 mmol/L (ref 135–145)

## 2017-10-10 NOTE — Progress Notes (Signed)
PROGRESS NOTE  [name]  VHQ:[id number]  DOB: May 01, 1927  DOA: 10/07/2017 PCP: Kathyrn Drown, MD  Brief Admission Hx: [name] is a 83 y.o. male with medical history significant of DVT on Eliquis and mild dementia presenting with weakness.  He was found to have pneumonia.    MDM/Assessment & Plan:   1. Community Acquired Pneumonia - continue IV antibiotics and supportive care.  Clinically he is showing improvement per family and my assessment. I do worry about aspiration and speech therapy evaluation completed and he has been placed on Dys 3 diet.  Follow cultures.  Influenza testing negative.  2. Prerenal AKI - resolved with IVF hydration.   3. Leukocytosis - resolved now. secondary to #1.  4. Falls - PT recommending SNF, consulted social worker. Family in agreement.  5. Hypokalemia - repleted. Follow.  6. Dementia - encouraged family to stay with him at bedside as much as possible which has been helpful and they stay with him 24/7.  7. Hip pain - degenerative changes noted on xrays, no acute findings.  PT evaluation appreciated.   DVT prophylaxis: lovenox Code Status: DNR Family Communication: son at bedside Disposition Plan: SNF tomorrow  Subjective: Pt demented but much more interactive and verbal.    Objective: Vitals:   10/09/17 1300 10/09/17 1454 10/09/17 2116 10/10/17 0559  BP: 122/75  (!) 154/84 (!) 144/77  Pulse: 68  63 (!) 56  Resp: 19  20 20   Temp: 98 F (36.7 C)  97.8 F (36.6 C) 97.6 F (36.4 C)  TempSrc: Axillary  Axillary Oral  SpO2: 92% 95% 94% 97%  Weight:      Height:        Intake/Output Summary (Last 24 hours) at 10/10/2017 1111 Last data filed at 10/10/2017 4132 Gross per 24 hour  Intake 3796.25 ml  Output -  Net 3796.25 ml   Filed Weights   10/07/17 1330 10/07/17 1900  Weight: 72.6 kg (160 lb) 79.8 kg (175 lb 14.4 oz)   REVIEW OF SYSTEMS  UTO: demented  Exam:  General exam: awake, alert, NAD. Cooperative.  Respiratory  system: BBS improved.  No increased work of breathing. Cardiovascular system: S1 & S2 heard normal. No JVD, murmurs, gallops, clicks or pedal edema. Gastrointestinal system: Abdomen is nondistended, soft and nontender. Normal bowel sounds heard. Central nervous system: Alert. Demented. No focal neurological deficits. Extremities: no CCE.  Data Reviewed: Basic Metabolic Panel: Recent Labs  Lab 10/07/17 1417 10/08/17 0308 10/10/17 0631  NA 139 142 143  K 3.3* 3.6 3.6  CL 102 107 110  CO2 26 25 26   GLUCOSE 124* 141* 121*  BUN 25* 24* 27*  CREATININE 1.35* 1.20 0.93  CALCIUM 7.9* 7.9* 7.8*   Liver Function Tests: Recent Labs  Lab 10/07/17 1417  AST 79*  ALT 72*  ALKPHOS 113  BILITOT 0.9  PROT 6.9  ALBUMIN 2.5*   No results for input(s): LIPASE, AMYLASE in the last 168 hours. No results for input(s): AMMONIA in the last 168 hours. CBC: Recent Labs  Lab 10/07/17 1400 10/08/17 0308 10/10/17 0631  WBC 15.3* 13.2* 10.3  NEUTROABS 13.2* 11.5*  --   HGB 12.3* 11.6* 11.6*  HCT 39.7 36.3* 37.4*  MCV 88.8 87.7 89.0  PLT 208 195 246   Cardiac Enzymes: Recent Labs  Lab 10/07/17 2117 10/08/17 0308 10/08/17 0859 10/08/17 1549  TROPONINI 0.04* 0.03* 0.03* <0.03   CBG (last 3)  No results for input(s): GLUCAP in the last  72 hours. Recent Results (from the past 240 hour(s))  Urine culture     Status: None   Collection Time: 10/07/17  1:00 PM  Result Value Ref Range Status   Specimen Description URINE, CATHETERIZED  Final   Special Requests NONE  Final   Culture   Final    NO GROWTH Performed at [facility], [address] [place], [place] [address]    Report Status 10/09/2017 FINAL  Final  Blood Culture (routine x 2)     Status: None (Preliminary result)   Collection Time: 10/07/17  2:16 PM  Result Value Ref Range Status   Specimen Description RIGHT ANTECUBITAL  Final   Special Requests   Final    BOTTLES DRAWN AEROBIC ONLY Blood Culture results may not  be optimal due to an inadequate volume of blood received in culture bottles   Culture NO GROWTH 2 DAYS  Final   Report Status PENDING  Incomplete  Blood Culture (routine x 2)     Status: None (Preliminary result)   Collection Time: 10/07/17  2:17 PM  Result Value Ref Range Status   Specimen Description LEFT ANTECUBITAL  Final   Special Requests   Final    BOTTLES DRAWN AEROBIC AND ANAEROBIC Blood Culture adequate volume   Culture NO GROWTH 2 DAYS  Final   Report Status PENDING  Incomplete     Studies: Dg Knee Right Port  Result Date: 10/08/2017 CLINICAL DATA:  83 y/o male status post fall at home with blunt trauma to the right knee and knee pain. EXAM: PORTABLE RIGHT KNEE - 1-2 VIEW COMPARISON:  Right tib-fib series 91315. FINDINGS: Small to moderate joint effusion is visible on the cross-table lateral view. Tricompartmental joint space loss and degenerative changes including osteophytosis and some chondrocalcinosis. The patella appears intact. No acute fracture or dislocation is identified. Intermittent calcified peripheral vascular disease. IMPRESSION: Joint effusion and severe degenerative changes with no acute fracture or dislocation identified. Electronically Signed   By: Genevie Ann M.D.   On: 10/08/2017 14:58     Scheduled Meds: . apixaban  2.5 mg Oral BID  . olopatadine  1 drop Both Eyes BID  . predniSONE  40 mg Oral Q breakfast   Continuous Infusions: . sodium chloride 75 mL/hr at 10/09/17 2315  . azithromycin Stopped (10/10/17 0029)  . cefTRIAXone (ROCEPHIN)  IV Stopped (10/09/17 2334)   Principal Problem:   Sepsis due to pneumonia [facility]) Active Problems:   Alzheimer's disease   Abnormal EKG   Hip pain   CAP (community acquired pneumonia)   Frequent falls  Time spent:   Irwin Brakeman, MD, FAAFP [facility] Pager [phone]  If 7PM-7AM, please contact night-coverage [email or web address] Password [email or web address] 10/10/2017, 11:11 AM    LOS: 3 days

## 2017-10-10 NOTE — Plan of Care (Signed)
Pt is not progressing 

## 2017-10-11 ENCOUNTER — Inpatient Hospital Stay
Admission: RE | Admit: 2017-10-11 | Discharge: 2017-11-16 | Disposition: A | Discharge status: Skilled Nursing Facility | Payer: Medicare Other | Source: Ambulatory Visit | Attending: Internal Medicine | Admitting: Internal Medicine

## 2017-10-11 DIAGNOSIS — G309 Alzheimer's disease, unspecified: Secondary | ICD-10-CM | POA: Diagnosis not present

## 2017-10-11 DIAGNOSIS — J181 Lobar pneumonia, unspecified organism: Secondary | ICD-10-CM | POA: Diagnosis not present

## 2017-10-11 DIAGNOSIS — A419 Sepsis, unspecified organism: Secondary | ICD-10-CM | POA: Diagnosis not present

## 2017-10-11 DIAGNOSIS — F0391 Unspecified dementia with behavioral disturbance: Secondary | ICD-10-CM | POA: Diagnosis not present

## 2017-10-11 DIAGNOSIS — I959 Hypotension, unspecified: Secondary | ICD-10-CM | POA: Diagnosis not present

## 2017-10-11 DIAGNOSIS — F0280 Dementia in other diseases classified elsewhere without behavioral disturbance: Secondary | ICD-10-CM | POA: Diagnosis not present

## 2017-10-11 DIAGNOSIS — M6281 Muscle weakness (generalized): Secondary | ICD-10-CM | POA: Diagnosis not present

## 2017-10-11 DIAGNOSIS — M1611 Unilateral primary osteoarthritis, right hip: Secondary | ICD-10-CM | POA: Diagnosis not present

## 2017-10-11 DIAGNOSIS — R262 Difficulty in walking, not elsewhere classified: Secondary | ICD-10-CM | POA: Diagnosis not present

## 2017-10-11 DIAGNOSIS — Z9181 History of falling: Secondary | ICD-10-CM | POA: Diagnosis not present

## 2017-10-11 DIAGNOSIS — Z7901 Long term (current) use of anticoagulants: Secondary | ICD-10-CM | POA: Diagnosis not present

## 2017-10-11 DIAGNOSIS — R531 Weakness: Secondary | ICD-10-CM | POA: Diagnosis not present

## 2017-10-11 DIAGNOSIS — R41841 Cognitive communication deficit: Secondary | ICD-10-CM | POA: Diagnosis not present

## 2017-10-11 DIAGNOSIS — R7989 Other specified abnormal findings of blood chemistry: Secondary | ICD-10-CM | POA: Diagnosis not present

## 2017-10-11 DIAGNOSIS — R296 Repeated falls: Secondary | ICD-10-CM | POA: Diagnosis not present

## 2017-10-11 DIAGNOSIS — Z5189 Encounter for other specified aftercare: Secondary | ICD-10-CM | POA: Diagnosis not present

## 2017-10-11 DIAGNOSIS — G301 Alzheimer's disease with late onset: Secondary | ICD-10-CM | POA: Diagnosis not present

## 2017-10-11 DIAGNOSIS — J189 Pneumonia, unspecified organism: Secondary | ICD-10-CM | POA: Diagnosis not present

## 2017-10-11 DIAGNOSIS — R634 Abnormal weight loss: Secondary | ICD-10-CM | POA: Diagnosis not present

## 2017-10-11 DIAGNOSIS — R1312 Dysphagia, oropharyngeal phase: Secondary | ICD-10-CM | POA: Diagnosis not present

## 2017-10-11 DIAGNOSIS — M25559 Pain in unspecified hip: Secondary | ICD-10-CM | POA: Diagnosis not present

## 2017-10-11 DIAGNOSIS — I82432 Acute embolism and thrombosis of left popliteal vein: Secondary | ICD-10-CM | POA: Diagnosis not present

## 2017-10-11 MED ORDER — ALPRAZOLAM 0.5 MG PO TABS: 0.5000 mg | ORAL_TABLET | Freq: Every evening | ORAL | 0 refills | Status: DC | PRN

## 2017-10-11 MED ORDER — DOXYCYCLINE HYCLATE 100 MG PO CAPS: 100.0000 mg | ORAL_CAPSULE | Freq: Two times a day (BID) | ORAL | 0 refills | Status: AC

## 2017-10-11 NOTE — Clinical Social Work Placement (Signed)
   CLINICAL SOCIAL WORK PLACEMENT  NOTE  Date:  10/11/2017  Patient Details  Name: [name] MRN: [id number] Date of Birth: Dec 30, 1926  Clinical Social Work is seeking post-discharge placement for this patient at the [place] level of care (*CSW will initial, date and re-position this form in  chart as items are completed):  Yes   Patient/family provided with [facility] Work Department's list of facilities offering this level of care within the geographic area requested by the patient (or if unable, by the patient's family).  Yes   Patient/family informed of their freedom to choose among providers that offer the needed level of care, that participate in Medicare, Medicaid or managed care program needed by the patient, have an available bed and are willing to accept the patient.  Yes   Patient/family informed of [facility] ownership interest in [facility] and [facility], as well as of the fact that they are under no obligation to receive care at these facilities.  PASRR submitted to EDS on 10/11/17     PASRR number received on 10/11/17     Existing PASRR number confirmed on       FL2 transmitted to all facilities in geographic area requested by pt/family on 10/11/17     FL2 transmitted to all facilities within larger geographic area on       Patient informed that his/her managed care company has contracts with or will negotiate with certain facilities, including the following:        Yes   Patient/family informed of bed offers received.  Patient chooses bed at [facility]     Physician recommends and patient chooses bed at      Patient to be transferred to [facility] on 10/11/17.  Patient to be transferred to facility by [facility] Staff.      Patient family notified on 10/11/17 of transfer.  Name of family member notified:  Deri Fuelling, daughter     PHYSICIAN       Additional Comment:  Facility aware and  discharge clinicals sent to [facility]. LCSW signing off.   _______________________________________________ Ihor Gully, LCSW 10/11/2017, 1:17 PM

## 2017-10-11 NOTE — NC FL2 (Signed)
[place] LEVEL OF CARE SCREENING TOOL     IDENTIFICATION  Patient Name: [name] Birthdate: 06-Nov-1927 Sex: male Admission Date (Current Location): 10/07/2017  [facility] [place] Number:  [employer] and Address:  [facility] [address] [place]      Provider Number: [id number]  Attending Physician Name and Address:  Murlean Iba, MD  Relative Name and Phone Number:       Current Level of Care: Hospital Recommended Level of Care: [place] Prior Approval Number:    Date Approved/Denied:   PASRR Number: [id number]  Discharge Plan: SNF    Current Diagnoses: Patient Active Problem List   Diagnosis Date Noted  . CAP (community acquired pneumonia) 10/09/2017  . Frequent falls 10/09/2017  . Sepsis due to pneumonia ([place]) 10/07/2017  . Abnormal EKG 10/07/2017  . Hip pain 10/07/2017  . Palliative care encounter   . Hyponatremia 03/01/2016  . Left leg weakness 03/01/2016  . Generalized weakness 02/28/2016  . Weakness generalized 02/24/2016  . Foul smelling urine 02/24/2016  . Lung mass 02/24/2016  . Left leg DVT ([place]) 11/28/2015  . Alzheimer's disease 11/28/2015  . Insomnia 11/28/2015  . Pedal edema 09/20/2014  . Bilateral cellulitis of lower leg 07/28/2014  . CONDUCTION DISORDER OF THE HEART 07/23/2009  . Venous (peripheral) insufficiency 07/23/2009  . ARTHRITIS, HIP 07/15/2009    Orientation RESPIRATION BLADDER Height & Weight     Self  Normal Continent Weight: 175 lb 14.4 oz (79.8 kg) Height:  6\' 1"  (185.4 cm)  BEHAVIORAL SYMPTOMS/MOOD NEUROLOGICAL BOWEL NUTRITION STATUS      Continent Diet(Dysphasia 3)  AMBULATORY STATUS COMMUNICATION OF NEEDS Skin   Extensive Assist Verbally Normal                       Personal Care Assistance Level of Assistance  Bathing, Feeding, Dressing Bathing Assistance: Maximum assistance Feeding assistance: Limited assistance Dressing  Assistance: Maximum assistance     Functional Limitations Info             SPECIAL CARE FACTORS FREQUENCY  PT (By licensed PT)     PT Frequency: min 5 times a week              Contractures Contractures Info: Not present    Additional Factors Info  Code Status, Allergies, Psychotropic Code Status Info: DNR Allergies Info: Ciprofloxacin, Keflex, Sulfa Antibiotics Psychotropic Info: Alprazolam         Current Medications (10/11/2017):  This is the current hospital active medication list Current Facility-Administered Medications  Medication Dose Route Frequency Provider Last Rate Last Dose  . 0.9 %  sodium chloride infusion   Intravenous Continuous Karmen Bongo, MD 75 mL/hr at 10/11/17 0142    . ALPRAZolam Duanne Moron) tablet 0.5 mg  0.5 mg Oral QHS PRN Karmen Bongo, MD   0.5 mg at 10/10/17 2219  . apixaban (ELIQUIS) tablet 2.5 mg  2.5 mg Oral BID Karmen Bongo, MD   2.5 mg at 10/11/17 1012  . azithromycin (ZITHROMAX) 500 mg in dextrose 5 % 250 mL IVPB  500 mg Intravenous Q24H Karmen Bongo, MD   Stopped at 10/11/17 0315  . cefTRIAXone (ROCEPHIN) 1 g in dextrose 5 % 50 mL IVPB  1 g Intravenous Q24H Karmen Bongo, MD   Stopped at 10/11/17 0315  . morphine 2 MG/ML injection 2 mg  2 mg Intravenous Q2H PRN Karmen Bongo, MD   2 mg  at 10/11/17 1042  . olopatadine (PATANOL) 0.1 % ophthalmic solution 1 drop  1 drop Both Eyes BID Karmen Bongo, MD   1 drop at 10/11/17 1011  . predniSONE (DELTASONE) tablet 40 mg  40 mg Oral Q breakfast Karmen Bongo, MD   40 mg at 10/11/17 1012     Discharge Medications: Please see discharge summary for a list of discharge medications.  Relevant Imaging Results:  Relevant Lab Results:   Additional Information SSN: [id number] A  Shade Flood, LCSW

## 2017-10-11 NOTE — Clinical Social Work Note (Signed)
Clinical Social Work Assessment  Patient Details  Name: [name] MRN: [id number] Date of Birth: 1927/11/11  Date of referral:  10/11/17               Reason for consult:  Facility Placement                Permission sought to share information with:    Permission granted to share information::     Name::        Agency::     Relationship::     Contact Information:     Housing/Transportation Living arrangements for the past 2 months:  Single Family Home Source of Information:  Adult Children Patient Interpreter Needed:  None Criminal Activity/Legal Involvement Pertinent to Current Situation/Hospitalization:    Significant Relationships:    Lives with:  Spouse Do you feel safe going back to the place where you live?    Need for family participation in patient care:  Yes (Comment)  Care giving concerns: Pt needs rehab prior to being able to return home.   Social Worker assessment / plan: Pt is an 83 year old male admitted with sepsis, pneumonia. Met with pt and family this AM. They are aware of recommendation for short term SNF rehab. Discussed referral options and they request referral to [facility]. Referral to [facility] started. Will initiate referral to [facility] if [facility] declines. Per MD, pt stable for dc today.  Employment status:  Retired Forensic scientist:  Medicare PT Recommendations:  [place] / Referral to community resources:     Patient/Family's Response to care: Pt oriented to self only. Family accepting of SW care.  Patient/Family's Understanding of and Emotional Response to Diagnosis, Current Treatment, and Prognosis: Family appear to understand diagnosis and treatment needs. They are accepting of plan.  Emotional Assessment Appearance:  Appears stated age Attitude/Demeanor/Rapport:    Affect (typically observed):  Flat Orientation:  Oriented to Self Alcohol / Substance use:    Psych involvement (Current and /or in  the community):     Discharge Needs  Concerns to be addressed:  Discharge Planning Concerns Readmission within the last 30 days:  No Current discharge risk:  None Barriers to Discharge:  No Barriers Identified   Shade Flood, LCSW 10/11/2017, 12:33 PM

## 2017-10-11 NOTE — Progress Notes (Signed)
Pt's IV catheter removed and intact. Pt's IV site clean dry and intact. Report called and given to [facility]). All questions were answered and no further questions at this time. Pt in stable condition and in no acute distress at time of discharge. Pt transported by nurse tech.

## 2017-10-11 NOTE — Discharge Instructions (Signed)
Follow with Primary MD  Luking, Scott A, MD  and other consultant's as instructed your Hospitalist MD ° °Please get a complete blood count and chemistry panel checked by your Primary MD at your next visit, and again as instructed by your Primary MD. ° °Get Medicines reviewed and adjusted: °Please take all your medications with you for your next visit with your Primary MD ° °Laboratory/radiological data: °Please request your Primary MD to go over all hospital tests and procedure/radiological results at the follow up, please ask your Primary MD to get all Hospital records sent to his/her office. ° °In some cases, they will be blood work, cultures and biopsy results pending at the time of your discharge. Please request that your primary care M.D. follows up on these results. ° °Also Note the following: °If you experience worsening of your admission symptoms, develop shortness of breath, life threatening emergency, suicidal or homicidal thoughts you must seek medical attention immediately by calling 911 or calling your MD immediately  if symptoms less severe. ° °You must read complete instructions/literature along with all the possible adverse reactions/side effects for all the Medicines you take and that have been prescribed to you. Take any new Medicines after you have completely understood and accpet all the possible adverse reactions/side effects.  ° °Do not drive when taking Pain medications or sleeping medications (Benzodaizepines) ° °Do not take more than prescribed Pain, Sleep and Anxiety Medications. It is not advisable to combine anxiety,sleep and pain medications without talking with your primary care practitioner ° °Special Instructions: If you have smoked or chewed Tobacco  in the last 2 yrs please stop smoking, stop any regular Alcohol  and or any Recreational drug use. ° °Wear Seat belts while driving. ° °Please note: °You were cared for by a hospitalist during your hospital stay. Once you are discharged,  your primary care physician will handle any further medical issues. Please note that NO REFILLS for any discharge medications will be authorized once you are discharged, as it is imperative that you return to your primary care physician (or establish a relationship with a primary care physician if you do not have one) for your post hospital discharge needs so that they can reassess your need for medications and monitor your lab values. ° ° ° ° °

## 2017-10-11 NOTE — Discharge Summary (Signed)
Physician Discharge Summary  NYZIER BOIVIN JOA:[id number] DOB: 1927-04-03 DOA: 10/07/2017  PCP: Kathyrn Drown, MD  Admit date: 10/07/2017 Discharge date: 10/11/2017  Admitted From: Home  Disposition: SNF  Recommendations for Outpatient Follow-up:  1. Follow up with PCP in 2 weeks 2. Please obtain BMP/CBC in one week 3. Fall precautions recommended 4. Please follow up on the following pending results: final culture results  Discharge Condition: STABLE  CODE STATUS: DNR Diet: Dysphagia 3  Brief Hospitalization Summary: Please see all hospital notes, images, labs for full details of the hospitalization.  From HPI: [name] is a 83 y.o. male with medical history significant of DVT on Eliquis and mild dementia presenting with weakness.  History was obtained from his daughter.  "In the last 3 days, he has started falling off.  Not alert.  Didn't want to walk.  Didn't care about going to the bathroom, where he went to the bathroom it did not matter."  Since being in the ER, he is more alert than he has been in 3 days.  "The only thing he will drink is [name] and Co-Cola".  +incontinence x 3 days.  +urinary odor.  Dr. Wolfgang Phoenix did a home visit today and recommended admission, was concerned about PNA and dehydration.  He has had congestion, rhinorrhea, and cough x 3 days.  They did not check his temperature.  He is also complaining of his right leg hurting.  He has a h/o DVT in his left leg and is on Eliquis, but the right leg pain in the thigh region is new.  ED Course: Sepsis, fever 101, leukocytosis, tachypnea.  Given Zosyn and Vanc.  CXR with RUL PNA.    Brief Admission Hx: [name] a 83 y.o.malewith medical history significant ofDVT on Eliquis and mild dementia presenting with weakness.  He was found to have pneumonia.    MDM/Assessment & Plan:   1. Community Acquired Pneumonia - treated with IV antibiotics and supportive care.  Clinically he is showing improvement  per family and my assessment. I do worry about aspiration and speech therapy evaluation completed and he has been placed on Dys 3 diet.  Follow cultures.  Influenza testing negative. Discharge on 5 more days of oral doxycycline. 2. Prerenal AKI - resolved with IVF hydration.   3. Leukocytosis - resolved now. secondary to #1.  4. Falls - PT recommending SNF, consulted social worker. Family in agreement.  5. Hypokalemia - repleted. Follow.  6. Dementia - encouraged family to stay with him at bedside as much as possible which has been helpful and they stay with him 24/7.  7. Hip pain - degenerative changes noted on xrays, no acute findings.  PT evaluation appreciated.   DVT prophylaxis: lovenox Code Status: DNR Family Communication: son at bedside Disposition Plan: SNF  Discharge Diagnoses:  Principal Problem:   Sepsis due to pneumonia [facility]) Active Problems:   Alzheimer's disease   Abnormal EKG   Hip pain   CAP (community acquired pneumonia)   Frequent falls  Discharge Instructions:  Allergies as of 10/11/2017      Reactions   Ciprofloxacin Other (See Comments)   Reaction:  Neurological changes    Keflex [cephalexin] Nausea And Vomiting   Sulfa Antibiotics Other (See Comments)   Reaction:  Unknown       Medication List    TAKE these medications   ALPRAZolam 0.5 MG tablet Commonly known as:  XANAX Take 1 tablet (0.5 mg total) at bedtime  as needed by mouth for anxiety or sleep. What changed:  reasons to take this   calcium carbonate 750 MG chewable tablet Commonly known as:  TUMS EX Chew 2 tablets by mouth every 2 (two) hours as needed for heartburn.   doxycycline 100 MG capsule Commonly known as:  VIBRAMYCIN Take 1 capsule (100 mg total) 2 (two) times daily for 5 days by mouth.   ELIQUIS 2.5 MG Tabs tablet Generic drug:  apixaban TAKE ONE TABLET BY MOUTH TWICE DAILY.   nitroGLYCERIN 0.4 MG SL tablet Commonly known as:  NITROSTAT Place 1 tablet (0.4 mg total)  under the tongue every 5 (five) minutes as needed for chest pain.   olopatadine 0.1 % ophthalmic solution Commonly known as:  PATANOL Place 1 drop into both eyes 2 (two) times daily.      Follow-up Information    Luking, Elayne Snare, MD. Schedule an appointment as soon as possible for a visit in 2 week(s).   Specialty:  Family Medicine Contact information: [place] [address] [phone]          Allergies  Allergen Reactions  . Ciprofloxacin Other (See Comments)    Reaction:  Neurological changes   . Keflex [Cephalexin] Nausea And Vomiting  . Sulfa Antibiotics Other (See Comments)    Reaction:  Unknown    Current Discharge Medication List    START taking these medications   Details  doxycycline (VIBRAMYCIN) 100 MG capsule Take 1 capsule (100 mg total) 2 (two) times daily for 5 days by mouth. Qty: 10 capsule, Refills: 0      CONTINUE these medications which have CHANGED   Details  ALPRAZolam (XANAX) 0.5 MG tablet Take 1 tablet (0.5 mg total) at bedtime as needed by mouth for anxiety or sleep. Qty: 10 tablet, Refills: 0      CONTINUE these medications which have NOT CHANGED   Details  ELIQUIS 2.5 MG TABS tablet TAKE ONE TABLET BY MOUTH TWICE DAILY. Qty: 60 tablet, Refills: 2    nitroGLYCERIN (NITROSTAT) 0.4 MG SL tablet Place 1 tablet (0.4 mg total) under the tongue every 5 (five) minutes as needed for chest pain. Qty: 50 tablet, Refills: 0    calcium carbonate (TUMS EX) 750 MG chewable tablet Chew 2 tablets by mouth every 2 (two) hours as needed for heartburn.    olopatadine (PATANOL) 0.1 % ophthalmic solution Place 1 drop into both eyes 2 (two) times daily. Qty: 5 mL, Refills: 12        Procedures/Studies: Dg Chest Port 1 View  Result Date: 10/07/2017 CLINICAL DATA:  Fever, dementia EXAM: PORTABLE CHEST 1 VIEW COMPARISON:  CT angio chest of 02/28/2017 and chest x-ray of 02/24/2016 FINDINGS: There is new parenchymal opacity within  the right mid upper lung field most consistent with pneumonia and consolidation. Small right pleural effusion cannot be excluded. The heart is borderline enlarged. No acute bony abnormality is seen. IMPRESSION: 1. Consolidation within the right upper mid lung most consistent with pneumonia with possible small right effusion. 2. Stable borderline cardiomegaly. Electronically Signed   By: Ivar Drape M.D.   On: 10/07/2017 14:28   Dg Knee Right Port  Result Date: 10/08/2017 CLINICAL DATA:  83 y/o male status post fall at home with blunt trauma to the right knee and knee pain. EXAM: PORTABLE RIGHT KNEE - 1-2 VIEW COMPARISON:  Right tib-fib series 91315. FINDINGS: Small to moderate joint effusion is visible on the cross-table lateral view. Tricompartmental joint space  loss and degenerative changes including osteophytosis and some chondrocalcinosis. The patella appears intact. No acute fracture or dislocation is identified. Intermittent calcified peripheral vascular disease. IMPRESSION: Joint effusion and severe degenerative changes with no acute fracture or dislocation identified. Electronically Signed   By: Genevie Ann M.D.   On: 10/08/2017 14:58   Dg Hip Port Unilat With Pelvis 1v Right  Result Date: 10/07/2017 CLINICAL DATA:  Chronic right hip pain.  No known injury. EXAM: DG HIP (WITH OR WITHOUT PELVIS) 1V PORT RIGHT COMPARISON:  None. FINDINGS: Postoperative changes with left hip arthroplasty. Arthroplasty components are incompletely visualized within the field of view. The pelvis appears intact. SI joints and symphysis pubis are not displaced. Degenerative changes in the lower lumbar spine. Degenerative changes in the right hip with narrowing and sclerosis of the acetabular joint and small osteophytes on both sides of the joint. No evidence of acute fracture or dislocation of the right hip. Soft tissues are unremarkable. IMPRESSION: Degenerative changes in the lower lumbar spine and right hip. Left hip  arthroplasty. No acute bony abnormalities. Electronically Signed   By: Lucienne Capers M.D.   On: 10/07/2017 22:56      Subjective: Pt demented but much more interactive and verbal.    Discharge Exam: Vitals:   10/11/17 0603 10/11/17 1142  BP: (!) 152/70   Pulse: (!) 58   Resp: 19   Temp: (!) 97.4 F (36.3 C)   SpO2: 95% 92%   Vitals:   10/10/17 1537 10/10/17 2113 10/11/17 0603 10/11/17 1142  BP:  (!) 143/71 (!) 152/70   Pulse:  66 (!) 58   Resp:  19 19   Temp:  98.4 F (36.9 C) (!) 97.4 F (36.3 C)   TempSrc:  Axillary Oral   SpO2: 92% 95% 95% 92%  Weight:      Height:        General exam: awake, alert, NAD. Cooperative.  Respiratory system: BBS improved.  No increased work of breathing. Cardiovascular system: S1 & S2 heard normal. No JVD, murmurs, gallops, clicks or pedal edema. Gastrointestinal system: Abdomen is nondistended, soft and nontender. Normal bowel sounds heard. Central nervous system: Alert. Demented. No focal neurological deficits. Extremities: no CCE.     The results of significant diagnostics from this hospitalization (including imaging, microbiology, ancillary and laboratory) are listed below for reference.     Microbiology: Recent Results (from the past 240 hour(s))  Urine culture     Status: None   Collection Time: 10/07/17  1:00 PM  Result Value Ref Range Status   Specimen Description URINE, CATHETERIZED  Final   Special Requests NONE  Final   Culture   Final    NO GROWTH Performed at [facility], [address] [place], [place] [address]    Report Status 10/09/2017 FINAL  Final  Blood Culture (routine x 2)     Status: None (Preliminary result)   Collection Time: 10/07/17  2:16 PM  Result Value Ref Range Status   Specimen Description RIGHT ANTECUBITAL  Final   Special Requests   Final    BOTTLES DRAWN AEROBIC ONLY Blood Culture results may not be optimal due to an inadequate volume of blood received in culture bottles    Culture NO GROWTH 4 DAYS  Final   Report Status PENDING  Incomplete  Blood Culture (routine x 2)     Status: None (Preliminary result)   Collection Time: 10/07/17  2:17 PM  Result Value Ref Range Status  Specimen Description LEFT ANTECUBITAL  Final   Special Requests   Final    BOTTLES DRAWN AEROBIC AND ANAEROBIC Blood Culture adequate volume   Culture NO GROWTH 4 DAYS  Final   Report Status PENDING  Incomplete     Labs: BNP (last 3 results) No results for input(s): BNP in the last 8760 hours. Basic Metabolic Panel: Recent Labs  Lab 10/07/17 1417 10/08/17 0308 10/10/17 0631  NA 139 142 143  K 3.3* 3.6 3.6  CL 102 107 110  CO2 26 25 26   GLUCOSE 124* 141* 121*  BUN 25* 24* 27*  CREATININE 1.35* 1.20 0.93  CALCIUM 7.9* 7.9* 7.8*   Liver Function Tests: Recent Labs  Lab 10/07/17 1417  AST 79*  ALT 72*  ALKPHOS 113  BILITOT 0.9  PROT 6.9  ALBUMIN 2.5*   No results for input(s): LIPASE, AMYLASE in the last 168 hours. No results for input(s): AMMONIA in the last 168 hours. CBC: Recent Labs  Lab 10/07/17 1400 10/08/17 0308 10/10/17 0631  WBC 15.3* 13.2* 10.3  NEUTROABS 13.2* 11.5*  --   HGB 12.3* 11.6* 11.6*  HCT 39.7 36.3* 37.4*  MCV 88.8 87.7 89.0  PLT 208 195 246   Cardiac Enzymes: Recent Labs  Lab 10/07/17 2117 10/08/17 0308 10/08/17 0859 10/08/17 1549  TROPONINI 0.04* 0.03* 0.03* <0.03   BNP: Invalid input(s): POCBNP CBG: No results for input(s): GLUCAP in the last 168 hours. D-Dimer No results for input(s): DDIMER in the last 72 hours. Hgb A1c No results for input(s): HGBA1C in the last 72 hours. Lipid Profile No results for input(s): CHOL, HDL, LDLCALC, TRIG, CHOLHDL, LDLDIRECT in the last 72 hours. Thyroid function studies No results for input(s): TSH, T4TOTAL, T3FREE, THYROIDAB in the last 72 hours.  Invalid input(s): FREET3 Anemia work up No results for input(s): VITAMINB12, FOLATE, FERRITIN, TIBC, IRON, RETICCTPCT in the last 72  hours. Urinalysis    Component Value Date/Time   COLORURINE AMBER (A) 10/07/2017 1300   APPEARANCEUR HAZY (A) 10/07/2017 1300   LABSPEC 1.024 10/07/2017 1300   PHURINE 5.0 10/07/2017 1300   GLUCOSEU NEGATIVE 10/07/2017 1300   HGBUR SMALL (A) 10/07/2017 1300   BILIRUBINUR NEGATIVE 10/07/2017 1300   KETONESUR NEGATIVE 10/07/2017 1300   PROTEINUR 100 (A) 10/07/2017 1300   UROBILINOGEN 0.2 08/08/2009 1013   NITRITE NEGATIVE 10/07/2017 1300   LEUKOCYTESUR NEGATIVE 10/07/2017 1300   Sepsis Labs Invalid input(s): PROCALCITONIN,  WBC,  LACTICIDVEN Microbiology Recent Results (from the past 240 hour(s))  Urine culture     Status: None   Collection Time: 10/07/17  1:00 PM  Result Value Ref Range Status   Specimen Description URINE, CATHETERIZED  Final   Special Requests NONE  Final   Culture   Final    NO GROWTH Performed at [facility], [place] [address] [place], [place] [address]    Report Status 10/09/2017 FINAL  Final  Blood Culture (routine x 2)     Status: None (Preliminary result)   Collection Time: 10/07/17  2:16 PM  Result Value Ref Range Status   Specimen Description RIGHT ANTECUBITAL  Final   Special Requests   Final    BOTTLES DRAWN AEROBIC ONLY Blood Culture results may not be optimal due to an inadequate volume of blood received in culture bottles   Culture NO GROWTH 4 DAYS  Final   Report Status PENDING  Incomplete  Blood Culture (routine x 2)     Status: None (Preliminary result)   Collection  Time: 10/07/17  2:17 PM  Result Value Ref Range Status   Specimen Description LEFT ANTECUBITAL  Final   Special Requests   Final    BOTTLES DRAWN AEROBIC AND ANAEROBIC Blood Culture adequate volume   Culture NO GROWTH 4 DAYS  Final   Report Status PENDING  Incomplete   Time coordinating discharge: 33 mins  SIGNED:  Irwin Brakeman, MD  [facility] 10/11/2017, 12:27 PM Pager [phone]  If 7PM-7AM, please contact night-coverage [email or web address] Password  [email or web address]

## 2017-10-12 ENCOUNTER — Non-Acute Institutional Stay (SKILLED_NURSING_FACILITY): Payer: Medicare Other | Admitting: Internal Medicine

## 2017-10-12 ENCOUNTER — Encounter: Payer: Self-pay | Admitting: Internal Medicine

## 2017-10-12 DIAGNOSIS — I82432 Acute embolism and thrombosis of left popliteal vein: Secondary | ICD-10-CM

## 2017-10-12 DIAGNOSIS — J181 Lobar pneumonia, unspecified organism: Secondary | ICD-10-CM | POA: Diagnosis not present

## 2017-10-12 DIAGNOSIS — G301 Alzheimer's disease with late onset: Secondary | ICD-10-CM | POA: Diagnosis not present

## 2017-10-12 DIAGNOSIS — R531 Weakness: Secondary | ICD-10-CM | POA: Diagnosis not present

## 2017-10-12 DIAGNOSIS — F0280 Dementia in other diseases classified elsewhere without behavioral disturbance: Secondary | ICD-10-CM | POA: Diagnosis not present

## 2017-10-12 DIAGNOSIS — J189 Pneumonia, unspecified organism: Secondary | ICD-10-CM

## 2017-10-12 LAB — CULTURE, BLOOD (ROUTINE X 2)
Culture: NO GROWTH
Culture: NO GROWTH
Special Requests: ADEQUATE

## 2017-10-12 NOTE — Progress Notes (Signed)
Provider: Veleta Miners  Location:   [place] Room Number: 134/P Place of Service:  SNF (31)  PCP: Kathyrn Drown, MD Patient Care Team: Kathyrn Drown, MD as PCP - General (Family Medicine)  Extended Emergency Contact Information Primary Emergency Contact: Cannon,Colette Address: [address]          [place], [place] [address] [place] Phone: [phone] Relation: Daughter  Code Status: DNR Goals of Care: Advanced Directive information Advanced Directives 10/12/2017  Does Patient Have a Medical Advance Directive? Yes  Type of Advance Directive Out of facility DNR (pink MOST or yellow form)  Does patient want to make changes to medical advance directive? No - Patient declined  Copy of [place] in Chart? -  Would patient like information on creating a medical advance directive? -      Chief Complaint  Patient presents with  . New Admit To SNF    New Admission Visit    HPI: Patient is a 83 y.o. male seen today for admission to SNF for therapy.  Patient has h/o DVT on Eliquis, and Dementia, Chronic Venous Insufficiency, Who lives with his Grandson. Family brought him to the hospital after Dr Wolfgang Phoenix saw him for home visit and recommended him to go to the hospital.. Patient has been feeling weak and lethargic at home. He was admitted on 11/08- 11/12. He was found to have Community Acquired Pneumonia and Dehydration. He was started on Antibiotics and changed to Oral Doxycyline. He also was evaluated for aspiration and was started on Dysphagia 3 Diet.  Patient is discharged to SNF for therapy.  Patient unable to give me any history. He is alert and follows simple commands but does not remember anything. Per nurses he is very confused.  Past Medical History:  Diagnosis Date  . Arthritis, hip   . Assistance needed for mobility    walks with walker  . DVT (deep venous thrombosis) ([place]) 2016  . Mild dementia   .  Never smoked any substance   . Pneumonia 2002, 2010   Past Surgical History:  Procedure Laterality Date  . APPENDECTOMY     At age 16  . HIP SURGERY     left  . TONSILLECTOMY     As a child    reports that  has never smoked. he has never used smokeless tobacco. He reports that he does not drink alcohol or use drugs. Social History   Socioeconomic History  . Marital status: Married    Spouse name: Not on file  . Number of children: Not on file  . Years of education: Not on file  . Highest education level: Not on file  Social Needs  . Financial resource strain: Not on file  . Food insecurity - worry: Not on file  . Food insecurity - inability: Not on file  . Transportation needs - medical: Not on file  . Transportation needs - non-medical: Not on file  Occupational History  . 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 Smoking status: Never Smoker  . Smokeless tobacco: Never Used  Substance and Sexual Activity  . Alcohol use: No  . Drug use: No  . Sexual activity: Not on file  Other Topics Concern  . Not on file  Social History Narrative   No regular exercise    Functional Status Survey:    History reviewed. No pertinent family history.  Health Maintenance  Topic  Date Due  . INFLUENZA VACCINE  10/30/2017 (Originally 06/30/2017)  . TETANUS/TDAP  10/30/2017 (Originally 01/07/1946)  . PNA vac Low Risk Adult (2 of 2 - PPSV23) 10/30/2017 (Originally 09/21/2015)    Allergies  Allergen Reactions  . Ciprofloxacin Other (See Comments)    Reaction:  Neurological changes   . Keflex [Cephalexin] Nausea And Vomiting  . Sulfa Antibiotics Other (See Comments)    Reaction:  Unknown     Outpatient Encounter Medications as of 10/12/2017  Medication Sig  . ALPRAZolam (XANAX) 0.5 MG tablet Take 1 tablet (0.5 mg total) at bedtime as needed by mouth for anxiety or sleep.  . calcium carbonate (TUMS EX) 750 MG chewable tablet Chew 2 tablets by mouth every 2  (two) hours as needed for heartburn.  . doxycycline (VIBRAMYCIN) 100 MG capsule Take 1 capsule (100 mg total) 2 (two) times daily for 5 days by mouth.  . ELIQUIS 2.5 MG TABS tablet TAKE ONE TABLET BY MOUTH TWICE DAILY.  . nitroGLYCERIN (NITROSTAT) 0.4 MG SL tablet Place 1 tablet (0.4 mg total) under the tongue every 5 (five) minutes as needed for chest pain.  Marland Kitchen olopatadine (PATANOL) 0.1 % ophthalmic solution Place 1 drop into both eyes 2 (two) times daily.   No facility-administered encounter medications on file as of 10/12/2017.      Review of Systems  Unable to perform ROS: Dementia    Vitals:   10/12/17 0939  BP: 130/82  Pulse: 80  Resp: (!) 22  Temp: (!) 97.3 F (36.3 C)  TempSrc: Oral   There is no height or weight on file to calculate BMI. Physical Exam  Constitutional: He appears well-developed and well-nourished.  HENT:  Head: Normocephalic.  Mouth/Throat: Mucous membranes are dry.  Eyes: Pupils are equal, round, and reactive to light.  Neck: Neck supple.  Cardiovascular: Normal rate and normal heart sounds.  No murmur heard. Pulmonary/Chest: Effort normal and breath sounds normal. No respiratory distress. He has no wheezes. He has no rales.  Abdominal: Soft. Bowel sounds are normal. He exhibits no distension. There is no tenderness. There is no rebound.  Musculoskeletal:  Has Chronic Venous Changes Bilateral. Mild edema in Left leg.  Lymphadenopathy:    He has no cervical adenopathy.  Neurological: He is alert.  Not oriented to place or time. Did not know his DOB. Does follow simple commands. Good strength in UE. 4/5 in both LE. No Focal deficits.  Skin: Skin is warm and dry.  Psychiatric: He has a normal mood and affect. His behavior is normal.    Labs reviewed: Basic Metabolic Panel: Recent Labs    10/07/17 1417 10/08/17 0308 10/10/17 0631  NA 139 142 143  K 3.3* 3.6 3.6  CL 102 107 110  CO2 26 25 26   GLUCOSE 124* 141* 121*  BUN 25* 24* 27*    CREATININE 1.35* 1.20 0.93  CALCIUM 7.9* 7.9* 7.8*   Liver Function Tests: Recent Labs    02/28/17 1445 10/07/17 1417  AST 23 79*  ALT 16* 72*  ALKPHOS 95 113  BILITOT 0.6 0.9  PROT 7.7 6.9  ALBUMIN 4.1 2.5*   No results for input(s): LIPASE, AMYLASE in the last 8760 hours. No results for input(s): AMMONIA in the last 8760 hours. CBC: Recent Labs    10/07/17 1400 10/08/17 0308 10/10/17 0631  WBC 15.3* 13.2* 10.3  NEUTROABS 13.2* 11.5*  --   HGB 12.3* 11.6* 11.6*  HCT 39.7 36.3* 37.4*  MCV 88.8 87.7 89.0  PLT  208 195 246   Cardiac Enzymes: Recent Labs    10/08/17 0308 10/08/17 0859 10/08/17 1549  TROPONINI 0.03* 0.03* <0.03   BNP: Invalid input(s): POCBNP No results found for: HGBA1C Lab Results  Component Value Date   TSH 2.838 02/26/2016   Lab Results  Component Value Date   [id number] 290 02/26/2016   No results found for: FOLATE No results found for: IRON, TIBC, FERRITIN  Imaging and Procedures obtained prior to SNF admission: No results found.  Assessment/Plan  Community Acquired Pneumonia  Continue on Doxycycline. Patient afebrile and WBC count is normal now. ? Aspiratio risk. He is now on D3 Diet. DVT of LLE Patient on Low dose Eliquis. Dehydration Creat normal range with IV fluid sin the hospital Dementia Most likely Alzheimer Patient has significant dementia I don't think he would be able to do much therapy. He would need 24 hour care when discharged.  Anxiety On Xanax. Will try to restrict it especially with his h/o Falls. And Confusion.  Family/ staff Communication:   Labs/tests ordered: BMP and CBC in 1 week.  Total time spent in this patient care encounter was 45_ minutes; greater than 50% of the visit spent counseling patient, reviewing records , Labs and coordinating care for problems addressed at this encounter.

## 2017-10-15 ENCOUNTER — Non-Acute Institutional Stay (SKILLED_NURSING_FACILITY): Payer: Medicare Other | Admitting: Internal Medicine

## 2017-10-15 DIAGNOSIS — J189 Pneumonia, unspecified organism: Secondary | ICD-10-CM

## 2017-10-15 DIAGNOSIS — J181 Lobar pneumonia, unspecified organism: Secondary | ICD-10-CM

## 2017-10-15 DIAGNOSIS — F0391 Unspecified dementia with behavioral disturbance: Secondary | ICD-10-CM

## 2017-10-15 DIAGNOSIS — I82432 Acute embolism and thrombosis of left popliteal vein: Secondary | ICD-10-CM | POA: Diagnosis not present

## 2017-10-15 NOTE — Progress Notes (Signed)
This is an acute visit.  Level of CARE skilled.  Facility is [facility].  Chief complaint acute visit secondary to dementia with agitation.  History of present illness.  The patient is an 83 year old male seen today for agitation-he does have a history of dementia with intermittent agitation.  He has had an order for Xanax as needed at night for anxiety but this order was recently discontinued.  He was admitted to rehab after hospitalization for pneumonia.  Apparently he was found at home by family with increased weakness and lethargy.  He was treated with antibiotics and started on a dysphagia 3 diet secondary to concerns for aspiration.  Respiratory wise appears to be stable however apparently during the day at times he does have significant agitation--  Past Medical History:  Diagnosis Date  . Arthritis, hip   . Assistance needed for mobility    walks with walker  . DVT (deep venous thrombosis) ([place]) 2016  . Mild dementia   . Never smoked any substance   . Pneumonia 2002, 2010        Past Surgical History:  Procedure Laterality Date  . APPENDECTOMY     At age 63  . HIP SURGERY     left  . TONSILLECTOMY     As a child    reports that  has never smoked. he has never used smokeless tobacco. He reports that he does not drink alcohol or use drugs. Social History        Socioeconomic History  . Marital status: Married    Spouse name: Not on file  . Number of children: Not on file  . Years of education: Not on file  . Highest education level: Not on file  Social Needs  . Financial resource strain: Not on file  . Food insecurity - worry: Not on file  . Food insecurity - inability: Not on file  . Transportation needs - medical: Not on file  . Transportation needs - non-medical: Not on file  Occupational History  . 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 Smoking status: Never Smoker  . Smokeless tobacco:  Never Used  Substance and Sexual Activity  . Alcohol use: No  . Drug use: No  . Sexual activity: Not on file  Other Topics Concern  . Not on file  Social History Narrative   No regular exercise    Functional Status Survey:  History reviewed. No pertinent family history.      Health Maintenance  Topic Date Due  . INFLUENZA VACCINE  10/30/2017 (Originally 06/30/2017)  . TETANUS/TDAP  10/30/2017 (Originally 01/07/1946)  . PNA vac Low Risk Adult (2 of 2 - PPSV23) 10/30/2017 (Originally 09/21/2015)         Allergies  Allergen Reactions  . Ciprofloxacin Other (See Comments)    Reaction:  Neurological changes   . Keflex [Cephalexin] Nausea And Vomiting  . Sulfa Antibiotics Other (See Comments)    Reaction:  Unknown               .     . calcium carbonate (TUMS EX) 750 MG chewable tablet Chew 2 tablets by mouth every 2 (two) hours as needed for heartburn.  . doxycycline (VIBRAMYCIN) 100 MG capsule Take 1 capsule (100 mg total) 2 (two) times daily for 5 days by mouth.  . ELIQUIS 2.5 MG TABS tablet TAKE ONE TABLET BY MOUTH TWICE DAILY.  . nitroGLYCERIN (NITROSTAT) 0.4 MG SL tablet Place  1 tablet (0.4 mg total) under the tongue every 5 (five) minutes as needed for chest pain.  Marland Kitchen olopatadine (PATANOL) 0.1 % ophthalmic solution Place 1 drop into both eyes 2 (two) times daily.   No facility-administered encounter medications on file as of 10/12/2017.      Review of Systems   This is limited secondary to dementia when I asked him if he is hurting he says no he did not appear to have pain when I did perform gentle passive range of motion of extremities.   Physical exam.  He is afebrile pulse is 72 respirations 16 blood pressure 132/74.  General this is a somewhat frail elderly male currently confused but cooperative with simple verbal commands.  His skin is warm and dry.  Eyes visual acuity appears grossly intact.  Oropharynx is clear.  Chest is clear  to auscultation there is no labored breathing.  Heart is regular rate and rhythm without murmur gallop or rub mild he has baseline venous stasis changes lower extremities bilaterally with mild edema.  Abdomen is soft nontender with positive bowel sounds.  Musculoskeletal is able to move all extremities x4 with general frailty he is ambulatory in a wheelchair I could not really appreciate any grimacing or complaints of pain with gentle passive range of motion of his extremities.  Neurologic is grossly intact moves all extremities x4 cranial nerves intact he is alert.  Psych he is oriented to self only can follow simple verbal commands he was cooperative with exam but according to nursing he has periods of significant agitation- trying to fight staff-and yelling.  Labs.  October 10, 2017.  WBC 10.3 hemoglobin 11.6 platelets 246.  Sodium 143 potassium 3.6 BUN 27 creatinine 0.93.  Assessment and plan.  1.  Dementia with agitation- will start low-dose Ativan 0.25 mg every 8 hours as needed for 14-day course and monitor apparently this agitation-anxiety is intermittent but quite significant when it occurs-again will do a limited course and monitor--this was discussed with Dr  Lyndel Safe. via phone.  2.  Pneumonia appears to be stable in this regard with out any chest congestion noted or increased cough-he is finishing a course of doxycycline that ends tomorrow.  3.  History of dementia again continue supportive care and will do a short course of Ativan for situational agitation monitor for any sedation or falls.  4.  History of DVT-left lower extremity edema-he continues on a low-dose of Eliquis-this appears stable I    [id number] note greater than 25 minutes spent assessing patient-reviewing his chart- reviewing his labs- discussing his status with nursing staff as well as with Dr. Lyndel Safe- coordinating and formulating a plan of care-greater than 50% of time spent coordinating a plan of care  with input as noted above

## 2017-10-18 ENCOUNTER — Encounter (HOSPITAL_COMMUNITY)
Admission: RE | Admit: 2017-10-18 | Discharge: 2017-10-18 | Disposition: A | Discharge status: Home / Self Care | Payer: Medicare Other | Source: Skilled Nursing Facility | Attending: Internal Medicine | Admitting: Internal Medicine

## 2017-10-18 DIAGNOSIS — Z5189 Encounter for other specified aftercare: Secondary | ICD-10-CM | POA: Insufficient documentation

## 2017-10-18 DIAGNOSIS — M6281 Muscle weakness (generalized): Secondary | ICD-10-CM | POA: Insufficient documentation

## 2017-10-18 DIAGNOSIS — M1611 Unilateral primary osteoarthritis, right hip: Secondary | ICD-10-CM | POA: Insufficient documentation

## 2017-10-18 DIAGNOSIS — Z7901 Long term (current) use of anticoagulants: Secondary | ICD-10-CM | POA: Insufficient documentation

## 2017-10-18 DIAGNOSIS — J189 Pneumonia, unspecified organism: Secondary | ICD-10-CM | POA: Insufficient documentation

## 2017-10-18 LAB — CBC WITH DIFFERENTIAL/PLATELET
BASOS PCT: 0 %
Basophils Absolute: 0 10*3/uL (ref 0.0–0.1)
EOS PCT: 2 %
Eosinophils Absolute: 0.2 10*3/uL (ref 0.0–0.7)
HEMATOCRIT: 36.5 % — AB (ref 39.0–52.0)
HEMOGLOBIN: 11.2 g/dL — AB (ref 13.0–17.0)
LYMPHS PCT: 12 %
Lymphs Abs: 0.9 10*3/uL (ref 0.7–4.0)
MCH: 26.8 pg (ref 26.0–34.0)
MCHC: 30.7 g/dL (ref 30.0–36.0)
MCV: 87.3 fL (ref 78.0–100.0)
MONOS PCT: 11 %
Monocytes Absolute: 0.9 10*3/uL (ref 0.1–1.0)
NEUTROS ABS: 5.8 10*3/uL (ref 1.7–7.7)
NEUTROS PCT: 75 %
Platelets: 314 10*3/uL (ref 150–400)
RBC: 4.18 MIL/uL — ABNORMAL LOW (ref 4.22–5.81)
RDW: 15.1 % (ref 11.5–15.5)
WBC: 7.8 10*3/uL (ref 4.0–10.5)

## 2017-10-18 LAB — BASIC METABOLIC PANEL
Anion gap: 8 (ref 5–15)
BUN: 28 mg/dL — AB (ref 6–20)
CHLORIDE: 101 mmol/L (ref 101–111)
CO2: 28 mmol/L (ref 22–32)
CREATININE: 1.21 mg/dL (ref 0.61–1.24)
Calcium: 8.4 mg/dL — ABNORMAL LOW (ref 8.9–10.3)
GFR calc Af Amer: 60 mL/min — ABNORMAL LOW (ref >60)
GFR calc non Af Amer: 52 mL/min — ABNORMAL LOW (ref >60)
Glucose, Bld: 89 mg/dL (ref 65–99)
Potassium: 4.1 mmol/L (ref 3.5–5.1)
SODIUM: 137 mmol/L (ref 135–145)

## 2017-10-27 ENCOUNTER — Non-Acute Institutional Stay (SKILLED_NURSING_FACILITY): Payer: Medicare Other | Admitting: Internal Medicine

## 2017-10-27 ENCOUNTER — Encounter: Payer: Self-pay | Admitting: Internal Medicine

## 2017-10-27 DIAGNOSIS — I959 Hypotension, unspecified: Secondary | ICD-10-CM | POA: Diagnosis not present

## 2017-10-27 DIAGNOSIS — G301 Alzheimer's disease with late onset: Secondary | ICD-10-CM | POA: Diagnosis not present

## 2017-10-27 DIAGNOSIS — R634 Abnormal weight loss: Secondary | ICD-10-CM

## 2017-10-27 DIAGNOSIS — F0280 Dementia in other diseases classified elsewhere without behavioral disturbance: Secondary | ICD-10-CM | POA: Diagnosis not present

## 2017-10-27 NOTE — Progress Notes (Signed)
This is an acute visit.  Level care skilled.  Facility is [facility].  Chief complaint-acute visit follow-up?  Hypotension-also history of  weight loss.  History of present illness.  Patient is a pleasant 83 year old male who is here for rehab after hospitalization for pneumonia-He was treated with antibiotics which he has completed---  He was admitted on Xanax but this was discontinued eventually secondary to concerns about minimizing medications and fall risk.  However he did develop some intermittent agitation fighting with staff and a as needed Ativan was started for short course- apparently this is been needed quite infrequently.  I note listed blood pressure earlier today showed a systolic of 86 he is not on any blood pressure medication.  Nursing rechecked this and got a systolic in the 220U.  He was asymptomatic of any dizziness syncope or significant hypotension.  I retook it later today and also got a satisfactory reading of 120/70.  I see previous list of blood pressures of 112/63- 120/60 and appears the reading earlier today may have been a machine abnormality   Per chart review I do note he has had significant weight loss of about 16 pounds since admission.  He has been evaluated by speech therapy and is on a dysphagia 3 diet-with thickened nectar consistency liquids.  Per discussion with nursing apparently he does need help feeding secondary to his dementia    Past Medical History:  Diagnosis Date  . Arthritis, hip   . Assistance needed for mobility    walks with walker  . DVT (deep venous thrombosis) ([place]) 2016  . Mild dementia   . Never smoked any substance   . Pneumonia 2002, 2010        Past Surgical History:  Procedure Laterality Date  . APPENDECTOMY     At age 63  . HIP SURGERY     left  . TONSILLECTOMY     As a child   reports that has never smoked. he has never used smokeless tobacco. He reports that he does not  drink alcohol or use drugs. Social History        Socioeconomic History  . Marital status: Married    Spouse name: Not on file  . Number of children: Not on file  . Years of education: Not on file  . Highest education level: Not on file  Social Needs  . Financial resource strain: Not on file  . Food insecurity - worry: Not on file  . Food insecurity - inability: Not on file  . Transportation needs - medical: Not on file  . Transportation needs - non-medical: Not on file  Occupational History  . 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 Smoking status: Never Smoker  . Smokeless tobacco: Never Used  Substance and Sexual Activity  . Alcohol use: No  . Drug use: No  . Sexual activity: Not on file  Other Topics Concern  . Not on file  Social History Narrative   No regular exercise    Functional Status Survey:  History reviewed. No pertinent family history.      Health Maintenance  Topic Date Due  . INFLUENZA VACCINE  10/30/2017 (Originally 06/30/2017)  . TETANUS/TDAP  10/30/2017 (Originally 01/07/1946)  . PNA vac Low Risk Adult (2 of 2 - PPSV23) 10/30/2017 (Originally 09/21/2015)         Allergies  Allergen Reactions  . Ciprofloxacin Other (See Comments)    Reaction: Neurological changes   .  Keflex [Cephalexin] Nausea And Vomiting  . Sulfa Antibiotics Other (See Comments)    Reaction: Unknown               .     . calcium carbonate (TUMS EX) 750 MG chewable tablet Chew 2 tablets by mouth every 2 (two) hours as needed for heartburn.  .  Ativan 0.5 mg every 8 hours as needed through October 29, 2017    . ELIQUIS 2.5 MG TABS tablet TAKE ONE TABLET BY MOUTH TWICE DAILY.  . nitroGLYCERIN (NITROSTAT) 0.4 MG SL tablet Place 1 tablet (0.4 mg total) under the tongue every 5 (five) minutes as needed for chest pain.  Marland Kitchen olopatadine (PATANOL) 0.1 % ophthalmic solution Place 1 drop into both eyes 2 (two) times  daily.    Review of systems.  This is very limited secondary to dementia provided by nursing as well.  General no complaints of fever chills he has lost weight.  Skin does not complain of rashes or itching.  Head ears eyes nose mouth and throat is not complaining of visual changes or sore throat.  Respiratory says he does not have shortness of breath --apparently no significant coughing has been noted.  Cardiac he denies chest pain has chronic venous stasis changes lower extremities.  GI is not complaining of difficulty swallowing more abdominal pain there has been no nausea or vomiting to my knowledge.  Musculoskeletal has weakness but is not complaining of joint pain.  Neurologic does not complain of dizziness or headache or syncope.  Psych does have significant dementia had agitation at one point but this appears to have calm down.  Physical exam.  Is afebrile pulse is 62 respirations of 17 blood pressure taken manually 120/70 weight last noted on November 21 to be 170 pounds.  In general this is a pleasant elderly male in no distress sitting comfortably in his wheelchair.  His skin is warm and dry.  Eyes visual acuity appears grossly intact.  Oropharynx is clear mucous membranes appear fairly moist.  Chest is clear to auscultation with somewhat shallow air entry there is no labored breathing.  Heart is regular rate and rhythm somewhat distant heart sounds without murmur gallop or rub he has chronic venous stasis changes lower extremities which appears relatively unchanged.  Abdomen is soft nontender with positive bowel sounds.  Musculoskeletal does ambulate in wheelchair and is able to move all extremities x4 at baseline.  Neurologic appears grossly intact his speech is clear cranial nerves remain intact.  Psych he is oriented to self follow simple verbal commands with prompting-he is not agitated -he is pleasant and cooperative   Labs.  October 18, 2017.  Sodium 137 potassium 4.1 BUN 28 creatinine 1.21.  WBC 7.8 hemoglobin 11.2 platelets 314.  Assessment plan.  1.-  Hypotension-I suspect this was machine variation blood pressures appear to be stable.  Manual readings today show stability I got 120/70 this afternoon which appears relatively baseline he is not on any antihypertensives at this point will monitor.  2.  Weight loss-this appears to be fairly significant will order lab CMP--also TSH   to check his albumin and electrolytes and renal function although this has been relatively stable.  Also will order a dietary consult-he will need assistance for feeding and apparently this is being done by nursing staff-again will await labs-clinically he appears to be stable.  3.  History of dementia with behaviors at this appears to be stable he does have as needed Ativan  for 2 more days-apparently he has not required this much at all.  [id number].

## 2017-10-28 ENCOUNTER — Encounter (HOSPITAL_COMMUNITY)
Admission: RE | Admit: 2017-10-28 | Discharge: 2017-10-28 | Disposition: A | Discharge status: Home / Self Care | Payer: Medicare Other | Source: Skilled Nursing Facility | Attending: Internal Medicine | Admitting: Internal Medicine

## 2017-10-28 DIAGNOSIS — J189 Pneumonia, unspecified organism: Secondary | ICD-10-CM | POA: Insufficient documentation

## 2017-10-28 DIAGNOSIS — G309 Alzheimer's disease, unspecified: Secondary | ICD-10-CM | POA: Insufficient documentation

## 2017-10-28 DIAGNOSIS — Z5189 Encounter for other specified aftercare: Secondary | ICD-10-CM | POA: Insufficient documentation

## 2017-10-28 DIAGNOSIS — M6281 Muscle weakness (generalized): Secondary | ICD-10-CM | POA: Insufficient documentation

## 2017-10-28 DIAGNOSIS — R1312 Dysphagia, oropharyngeal phase: Secondary | ICD-10-CM | POA: Insufficient documentation

## 2017-10-28 DIAGNOSIS — M1611 Unilateral primary osteoarthritis, right hip: Secondary | ICD-10-CM | POA: Insufficient documentation

## 2017-10-28 LAB — COMPREHENSIVE METABOLIC PANEL
ALK PHOS: 148 U/L — AB (ref 38–126)
ALT: 35 U/L (ref 17–63)
AST: 28 U/L (ref 15–41)
Albumin: 3.1 g/dL — ABNORMAL LOW (ref 3.5–5.0)
Anion gap: 7 (ref 5–15)
BUN: 36 mg/dL — AB (ref 6–20)
CALCIUM: 9 mg/dL (ref 8.9–10.3)
CHLORIDE: 101 mmol/L (ref 101–111)
CO2: 27 mmol/L (ref 22–32)
CREATININE: 1.13 mg/dL (ref 0.61–1.24)
GFR, EST NON AFRICAN AMERICAN: 56 mL/min — AB (ref >60)
Glucose, Bld: 88 mg/dL (ref 65–99)
Potassium: 4.4 mmol/L (ref 3.5–5.1)
Sodium: 135 mmol/L (ref 135–145)
Total Bilirubin: 0.6 mg/dL (ref 0.3–1.2)
Total Protein: 7.1 g/dL (ref 6.5–8.1)

## 2017-10-28 LAB — TSH: TSH: 7.85 u[IU]/mL — AB (ref 0.350–4.500)

## 2017-11-03 ENCOUNTER — Encounter (HOSPITAL_COMMUNITY)
Admission: RE | Admit: 2017-11-03 | Discharge: 2017-11-03 | Disposition: A | Discharge status: Home / Self Care | Payer: Medicare Other | Source: Skilled Nursing Facility | Attending: Internal Medicine | Admitting: Internal Medicine

## 2017-11-03 DIAGNOSIS — M6281 Muscle weakness (generalized): Secondary | ICD-10-CM | POA: Insufficient documentation

## 2017-11-03 DIAGNOSIS — J189 Pneumonia, unspecified organism: Secondary | ICD-10-CM | POA: Insufficient documentation

## 2017-11-03 DIAGNOSIS — Z5189 Encounter for other specified aftercare: Secondary | ICD-10-CM | POA: Insufficient documentation

## 2017-11-03 DIAGNOSIS — Z7901 Long term (current) use of anticoagulants: Secondary | ICD-10-CM | POA: Insufficient documentation

## 2017-11-03 DIAGNOSIS — G309 Alzheimer's disease, unspecified: Secondary | ICD-10-CM | POA: Insufficient documentation

## 2017-11-03 DIAGNOSIS — E039 Hypothyroidism, unspecified: Secondary | ICD-10-CM | POA: Insufficient documentation

## 2017-11-03 LAB — BASIC METABOLIC PANEL
Anion gap: 6 (ref 5–15)
BUN: 34 mg/dL — AB (ref 6–20)
CALCIUM: 8.8 mg/dL — AB (ref 8.9–10.3)
CHLORIDE: 104 mmol/L (ref 101–111)
CO2: 28 mmol/L (ref 22–32)
CREATININE: 0.98 mg/dL (ref 0.61–1.24)
GFR calc Af Amer: >60 mL/min (ref >60)
GFR calc non Af Amer: >60 mL/min (ref >60)
Glucose, Bld: 88 mg/dL (ref 65–99)
Potassium: 4.3 mmol/L (ref 3.5–5.1)
SODIUM: 138 mmol/L (ref 135–145)

## 2017-11-15 ENCOUNTER — Encounter: Payer: Self-pay | Admitting: Internal Medicine

## 2017-11-15 ENCOUNTER — Non-Acute Institutional Stay (SKILLED_NURSING_FACILITY): Payer: Medicare Other | Admitting: Internal Medicine

## 2017-11-15 DIAGNOSIS — J181 Lobar pneumonia, unspecified organism: Secondary | ICD-10-CM

## 2017-11-15 DIAGNOSIS — I82432 Acute embolism and thrombosis of left popliteal vein: Secondary | ICD-10-CM | POA: Diagnosis not present

## 2017-11-15 DIAGNOSIS — J189 Pneumonia, unspecified organism: Secondary | ICD-10-CM

## 2017-11-15 DIAGNOSIS — R7989 Other specified abnormal findings of blood chemistry: Secondary | ICD-10-CM

## 2017-11-15 DIAGNOSIS — G301 Alzheimer's disease with late onset: Secondary | ICD-10-CM | POA: Diagnosis not present

## 2017-11-15 DIAGNOSIS — F0280 Dementia in other diseases classified elsewhere without behavioral disturbance: Secondary | ICD-10-CM | POA: Diagnosis not present

## 2017-11-15 NOTE — Progress Notes (Signed)
Location:   [facility] Room Number: 951/O Place of Service:  SNF (31)  Provider: Granville Lewis  PCP: Kathyrn Drown, MD Patient Care Team: Kathyrn Drown, MD as PCP - General (Family Medicine)  Extended Emergency Contact Information Primary Emergency Contact: Cannon,Colette Address: [address]          [place], [place] [address] [place] Phone: [phone] Relation: Daughter  Code Status: DNR Goals of care:  Advanced Directive information Advanced Directives 11/15/2017  Does Patient Have a Medical Advance Directive? Yes  Type of Advance Directive Out of facility DNR (pink MOST or yellow form)  Does patient want to make changes to medical advance directive? No - Patient declined  Copy of [place] in Chart? -  Would patient like information on creating a medical advance directive? -     Allergies  Allergen Reactions  . Ciprofloxacin Other (See Comments)    Reaction:  Neurological changes   . Keflex [Cephalexin] Nausea And Vomiting  . Sulfa Antibiotics Other (See Comments)    Reaction:  Unknown     Chief Complaint  Patient presents with  . Discharge Note    Discharge Visit    HPI:  83 y.o. male seen today for discharge from facility later this week.   He is here for rehab after being hospitalized for pneumonia.  This was treated successfully with antibiotics which he is completed.  Is today here is been relatively unremarkable he did have some situational agitation at one point he did receive a short course ofprn   Ativan which is since been discontinued.  He also had some weight loss but actually appears to be gaining this back and gained about 5 pounds recently.  He appears to be eating fairly well.  He is actually on minimal medications including low-dose Eliquis for history of DVT of the lower extremity.  Secondary to weight loss initially of about 16 pounds we did order lab work including  TSH which was mildly elevated this will warrant follow-up by primary care provider.  Currently he is resting in bed comfortably he is eating his supper appears to have a fairly good appetite he has no acute complaints continues to be pleasantly confused.  He will be going to an assisted living facility with a dementia unit and will need continued PT and OT for strengthening as well as a wheelchair secondary to debility with dementia as well as home health support for his numerous medical issues   Past Medical History:  Diagnosis Date  . Arthritis, hip   . Assistance needed for mobility    walks with walker  . DVT (deep venous thrombosis) ([place]) 2016  . Mild dementia   . Never smoked any substance   . Pneumonia 2002, 2010    Past Surgical History:  Procedure Laterality Date  . APPENDECTOMY     At age 55  . HIP SURGERY     left  . TONSILLECTOMY     As a child      reports that  has never smoked. he has never used smokeless tobacco. He reports that he does not drink alcohol or use drugs. Social History   Socioeconomic History  . Marital status: Married    Spouse name: Not on file  . Number of children: Not on file  . Years of education: Not on file  . Highest education level: Not on file  Social Needs  . Financial resource strain:  Not on file  . Food insecurity - worry: Not on file  . Food insecurity - inability: Not on file  . Transportation needs - medical: Not on file  . Transportation needs - non-medical: Not on file  Occupational History  . 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 Smoking status: Never Smoker  . Smokeless tobacco: Never Used  Substance and Sexual Activity  . Alcohol use: No  . Drug use: No  . Sexual activity: Not on file  Other Topics Concern  . Not on file  Social History Narrative   No regular exercise   Functional Status Survey:    Allergies  Allergen Reactions  . Ciprofloxacin Other (See Comments)     Reaction:  Neurological changes   . Keflex [Cephalexin] Nausea And Vomiting  . Sulfa Antibiotics Other (See Comments)    Reaction:  Unknown     Pertinent  Health Maintenance Due  Topic Date Due  . PNA vac Low Risk Adult (2 of 2 - PPSV23) 09/21/2015  . INFLUENZA VACCINE  06/30/2017    Medications:  Outpatient Encounter Medications as of 11/15/2017  Medication Sig  . calcium carbonate (TUMS EX) 750 MG chewable tablet Chew 2 tablets by mouth every 2 (two) hours as needed for heartburn.  Marland Kitchen ELIQUIS 2.5 MG TABS tablet TAKE ONE TABLET BY MOUTH TWICE DAILY.  . nitroGLYCERIN (NITROSTAT) 0.4 MG SL tablet Place 1 tablet (0.4 mg total) under the tongue every 5 (five) minutes as needed for chest pain.  Marland Kitchen olopatadine (PATANOL) 0.1 % ophthalmic solution Place 1 drop into both eyes 2 (two) times daily.  . [DISCONTINUED] ALPRAZolam (XANAX) 0.5 MG tablet Take 1 tablet (0.5 mg total) at bedtime as needed by mouth for anxiety or sleep.   No facility-administered encounter medications on file as of 11/15/2017.      Review of Systems   Essentially unobtainable secondary to dementia please see HPI nursing staff does not report any acute issues of her recent agitated episode appetite apparently has improved somewhat and he is now gaining weight which is encouraging  Vitals:   11/15/17 1550  BP: (!) 114/54  Pulse: (!) 58  Resp: 18  Temp: (!) 97 F (36.1 C)  TempSrc: Oral  SpO2: 100%  Of note   manual blood pressure this evening was 120/58 Weight is 175 pounds which is up approximately 5 pound over the past 2 weeks Physical Exam In general this is a pleasant elderly male in no distress lying comfortably in bed.  His skin is warm and dry he does have numerous solar-induced changes of his arms and face most prominently.  Eyes visual acuity appears grossly intact sclera and conjunctive are clear.  Oropharynx is clear mucous membranes moist abdomen is soft nontender with positive bowel sounds chest  is clear to auscultation with shallow air entry there is no labored breathing.  Heart is regular rate and rhythm without murmur gallop or rub he has chronic edema venous stasis changes lower legs bilaterally but this appears relatively unchanged   Abdomen is soft nontender with positive bowel sounds.  Musculoskeletal Limited exam since he is in bed but appears to move all extremities at baseline he does ambulate largely in wheelchair and does well with this.  Neurologic could not really appreciate lateralizing finding his speech is clear although he is somewhat confused.  Psych he is oriented to self he is pleasantly confused is able to carry on a short conversation-he did ask  me how I was feeling  Labs reviewed  Basic Metabolic Panel: Recent Labs    10/18/17 0555 10/28/17 0701 11/03/17 0706  NA 137 135 138  K 4.1 4.4 4.3  CL 101 101 104  CO2 28 27 28   GLUCOSE 89 88 88  BUN 28* 36* 34*  CREATININE 1.21 1.13 0.98  CALCIUM 8.4* 9.0 8.8*   Liver Function Tests: Recent Labs    02/28/17 1445 10/07/17 1417 10/28/17 0701  AST 23 79* 28  ALT 16* 72* 35  ALKPHOS 95 113 148*  BILITOT 0.6 0.9 0.6  PROT 7.7 6.9 7.1  ALBUMIN 4.1 2.5* 3.1*   No results for input(s): LIPASE, AMYLASE in the last 8760 hours. No results for input(s): AMMONIA in the last 8760 hours. CBC: Recent Labs    10/07/17 1400 10/08/17 0308 10/10/17 0631 10/18/17 0555  WBC 15.3* 13.2* 10.3 7.8  NEUTROABS 13.2* 11.5*  --  5.8  HGB 12.3* 11.6* 11.6* 11.2*  HCT 39.7 36.3* 37.4* 36.5*  MCV 88.8 87.7 89.0 87.3  PLT 208 195 246 314   Cardiac Enzymes: Recent Labs    10/08/17 0308 10/08/17 0859 10/08/17 1549  TROPONINI 0.03* 0.03* <0.03   BNP: Invalid input(s): POCBNP CBG: No results for input(s): GLUCAP in the last 8760 hours.  Procedures and Imaging Studies During Stay: No results found.  Assessment/Plan  #1 pneumonia this appears to be asymptomatic he is completed antibiotics appears to be  doing well in this regards does not complain of cough or shortness of breath I did not note any congestion    History of dementia with apparently at times behaviors-again he did receive a short course of prn Ativan  during his stay here but this has since been discontinued he is pleasant and amiable confused tonight.  He has significant weight loss it appears but now is gaining some of this back which is encouraging.  3.  History of mildly elevated TSH of 7.85-this was somewhat of an incidental finding when we were investigating his weight loss-this will need follow-up TSH by primary care provider.  4.  History of left lower extremity DVT-he continues on low-dose Eliquis appears to be tolerating this well  #5-history of hypotensionn?-This appears to be more of a machine reading issue-I got a manual of 120/58 this evening- I see previous readings of 117/60 as well as 102/54-he does not appear to be symptomatic of any hypotension will defer to primary care provider for any changes   Again he will be going to an assisted living center with a memory care unit he will need expedient follow-up with primary care follow-up for his numerous medical issues will need PT and OT as well as home health support for his numerous medical issues-he also will need a wheelchair for ambulation with weakness with a history of dementia.  [id number] note greater than 30 minutes spent on this discharge summary greater than 50% of time spent coordinating plan of care for numerous diagnoses

## 2017-11-16 ENCOUNTER — Encounter: Payer: Self-pay | Admitting: Internal Medicine

## 2017-11-17 DIAGNOSIS — G309 Alzheimer's disease, unspecified: Secondary | ICD-10-CM | POA: Diagnosis not present

## 2017-11-17 DIAGNOSIS — Z7901 Long term (current) use of anticoagulants: Secondary | ICD-10-CM | POA: Diagnosis not present

## 2017-11-18 DIAGNOSIS — Z7901 Long term (current) use of anticoagulants: Secondary | ICD-10-CM | POA: Diagnosis not present

## 2017-11-18 DIAGNOSIS — G309 Alzheimer's disease, unspecified: Secondary | ICD-10-CM | POA: Diagnosis not present

## 2017-11-22 DIAGNOSIS — G309 Alzheimer's disease, unspecified: Secondary | ICD-10-CM | POA: Diagnosis not present

## 2017-11-22 DIAGNOSIS — Z7901 Long term (current) use of anticoagulants: Secondary | ICD-10-CM | POA: Diagnosis not present

## 2017-11-25 ENCOUNTER — Encounter (HOSPITAL_COMMUNITY)
Admission: RE | Admit: 2017-11-25 | Discharge: 2017-11-25 | Disposition: A | Discharge status: Home / Self Care | Payer: Medicare Other | Source: Skilled Nursing Facility | Attending: *Deleted | Admitting: *Deleted

## 2017-11-25 DIAGNOSIS — M6281 Muscle weakness (generalized): Secondary | ICD-10-CM | POA: Diagnosis not present

## 2017-11-25 DIAGNOSIS — E039 Hypothyroidism, unspecified: Secondary | ICD-10-CM | POA: Diagnosis not present

## 2017-11-25 DIAGNOSIS — G309 Alzheimer's disease, unspecified: Secondary | ICD-10-CM | POA: Diagnosis not present

## 2017-11-25 DIAGNOSIS — I872 Venous insufficiency (chronic) (peripheral): Secondary | ICD-10-CM | POA: Diagnosis not present

## 2017-11-25 DIAGNOSIS — G301 Alzheimer's disease with late onset: Secondary | ICD-10-CM | POA: Diagnosis not present

## 2017-11-25 DIAGNOSIS — Z7901 Long term (current) use of anticoagulants: Secondary | ICD-10-CM | POA: Diagnosis not present

## 2017-11-29 DIAGNOSIS — Z7901 Long term (current) use of anticoagulants: Secondary | ICD-10-CM | POA: Diagnosis not present

## 2017-11-29 DIAGNOSIS — G309 Alzheimer's disease, unspecified: Secondary | ICD-10-CM | POA: Diagnosis not present

## 2017-12-01 DIAGNOSIS — Z7901 Long term (current) use of anticoagulants: Secondary | ICD-10-CM | POA: Diagnosis not present

## 2017-12-01 DIAGNOSIS — G309 Alzheimer's disease, unspecified: Secondary | ICD-10-CM | POA: Diagnosis not present

## 2017-12-02 DIAGNOSIS — G309 Alzheimer's disease, unspecified: Secondary | ICD-10-CM | POA: Diagnosis not present

## 2017-12-02 DIAGNOSIS — Z7901 Long term (current) use of anticoagulants: Secondary | ICD-10-CM | POA: Diagnosis not present

## 2017-12-07 DIAGNOSIS — G309 Alzheimer's disease, unspecified: Secondary | ICD-10-CM | POA: Diagnosis not present

## 2017-12-07 DIAGNOSIS — Z7901 Long term (current) use of anticoagulants: Secondary | ICD-10-CM | POA: Diagnosis not present

## 2017-12-09 DIAGNOSIS — G309 Alzheimer's disease, unspecified: Secondary | ICD-10-CM | POA: Diagnosis not present

## 2017-12-09 DIAGNOSIS — Z7901 Long term (current) use of anticoagulants: Secondary | ICD-10-CM | POA: Diagnosis not present

## 2017-12-14 DIAGNOSIS — G309 Alzheimer's disease, unspecified: Secondary | ICD-10-CM | POA: Diagnosis not present

## 2017-12-14 DIAGNOSIS — Z7901 Long term (current) use of anticoagulants: Secondary | ICD-10-CM | POA: Diagnosis not present

## 2017-12-16 DIAGNOSIS — Z7901 Long term (current) use of anticoagulants: Secondary | ICD-10-CM | POA: Diagnosis not present

## 2017-12-16 DIAGNOSIS — G309 Alzheimer's disease, unspecified: Secondary | ICD-10-CM | POA: Diagnosis not present

## 2017-12-21 DIAGNOSIS — Z7901 Long term (current) use of anticoagulants: Secondary | ICD-10-CM | POA: Diagnosis not present

## 2017-12-21 DIAGNOSIS — G309 Alzheimer's disease, unspecified: Secondary | ICD-10-CM | POA: Diagnosis not present

## 2017-12-23 DIAGNOSIS — Z7901 Long term (current) use of anticoagulants: Secondary | ICD-10-CM | POA: Diagnosis not present

## 2017-12-23 DIAGNOSIS — G309 Alzheimer's disease, unspecified: Secondary | ICD-10-CM | POA: Diagnosis not present

## 2017-12-28 DIAGNOSIS — G309 Alzheimer's disease, unspecified: Secondary | ICD-10-CM | POA: Diagnosis not present

## 2017-12-28 DIAGNOSIS — Z7901 Long term (current) use of anticoagulants: Secondary | ICD-10-CM | POA: Diagnosis not present

## 2017-12-30 DIAGNOSIS — E039 Hypothyroidism, unspecified: Secondary | ICD-10-CM | POA: Diagnosis not present

## 2017-12-30 DIAGNOSIS — Z7901 Long term (current) use of anticoagulants: Secondary | ICD-10-CM | POA: Diagnosis not present

## 2017-12-30 DIAGNOSIS — G309 Alzheimer's disease, unspecified: Secondary | ICD-10-CM | POA: Diagnosis not present

## 2017-12-30 DIAGNOSIS — G301 Alzheimer's disease with late onset: Secondary | ICD-10-CM | POA: Diagnosis not present

## 2017-12-30 DIAGNOSIS — M6281 Muscle weakness (generalized): Secondary | ICD-10-CM | POA: Diagnosis not present

## 2017-12-30 DIAGNOSIS — I872 Venous insufficiency (chronic) (peripheral): Secondary | ICD-10-CM | POA: Diagnosis not present

## 2018-01-04 DIAGNOSIS — Z7901 Long term (current) use of anticoagulants: Secondary | ICD-10-CM | POA: Diagnosis not present

## 2018-01-04 DIAGNOSIS — G309 Alzheimer's disease, unspecified: Secondary | ICD-10-CM | POA: Diagnosis not present

## 2018-01-06 DIAGNOSIS — G309 Alzheimer's disease, unspecified: Secondary | ICD-10-CM | POA: Diagnosis not present

## 2018-01-06 DIAGNOSIS — Z7901 Long term (current) use of anticoagulants: Secondary | ICD-10-CM | POA: Diagnosis not present

## 2018-01-11 DIAGNOSIS — Z7901 Long term (current) use of anticoagulants: Secondary | ICD-10-CM | POA: Diagnosis not present

## 2018-01-11 DIAGNOSIS — G309 Alzheimer's disease, unspecified: Secondary | ICD-10-CM | POA: Diagnosis not present

## 2018-01-13 DIAGNOSIS — I872 Venous insufficiency (chronic) (peripheral): Secondary | ICD-10-CM | POA: Diagnosis not present

## 2018-01-13 DIAGNOSIS — M6281 Muscle weakness (generalized): Secondary | ICD-10-CM | POA: Diagnosis not present

## 2018-01-13 DIAGNOSIS — E039 Hypothyroidism, unspecified: Secondary | ICD-10-CM | POA: Diagnosis not present

## 2018-01-13 DIAGNOSIS — G301 Alzheimer's disease with late onset: Secondary | ICD-10-CM | POA: Diagnosis not present

## 2018-01-16 DIAGNOSIS — M6281 Muscle weakness (generalized): Secondary | ICD-10-CM | POA: Diagnosis not present

## 2018-01-16 DIAGNOSIS — M1611 Unilateral primary osteoarthritis, right hip: Secondary | ICD-10-CM | POA: Diagnosis not present

## 2018-01-16 DIAGNOSIS — E039 Hypothyroidism, unspecified: Secondary | ICD-10-CM | POA: Diagnosis not present

## 2018-01-16 DIAGNOSIS — Z7901 Long term (current) use of anticoagulants: Secondary | ICD-10-CM | POA: Diagnosis not present

## 2018-01-16 DIAGNOSIS — F0280 Dementia in other diseases classified elsewhere without behavioral disturbance: Secondary | ICD-10-CM | POA: Diagnosis not present

## 2018-01-16 DIAGNOSIS — G309 Alzheimer's disease, unspecified: Secondary | ICD-10-CM | POA: Diagnosis not present

## 2018-01-18 IMAGING — CT CT CHEST W/O CM
2 of 3 series · 15 of 36 positions shown, 18 images · non-contrast
Comparison: Chest radiograph from earlier today. 05/15/2009 chest
CT.

CLINICAL DATA: Questionable new pulmonary nodule in the left upper
lung on chest radiograph from earlier today.

EXAM:
CT CHEST WITHOUT CONTRAST
TECHNIQUE: Multidetector CT imaging of the chest was performed following the
standard protocol without IV contrast.

[Series 2: chestroutine 5.0 b40f · axial · 0.57mm/px · z∈[-269,+16]mm · 12 of 67 slices shown, 15 images]
[im 5/67  mediastinal]
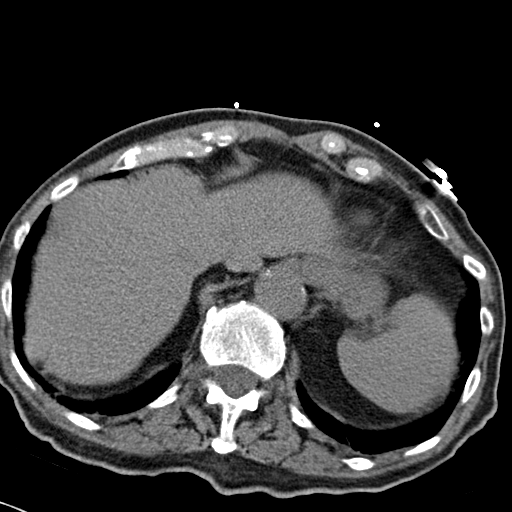
[im 5/67  lung]
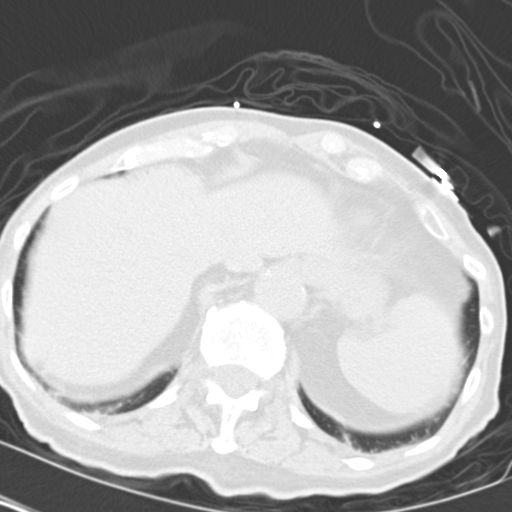
[im 10/67  lung]
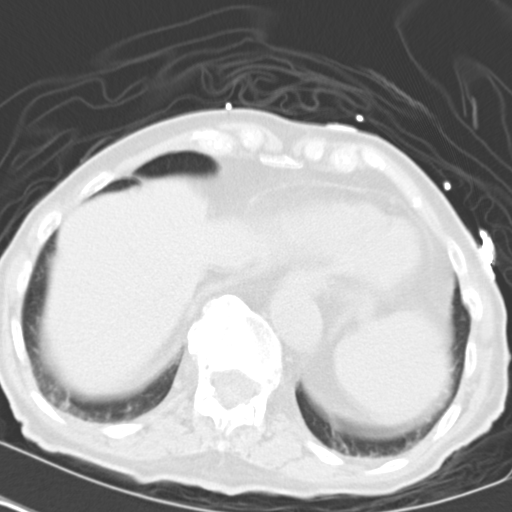
[im 15/67  lung]
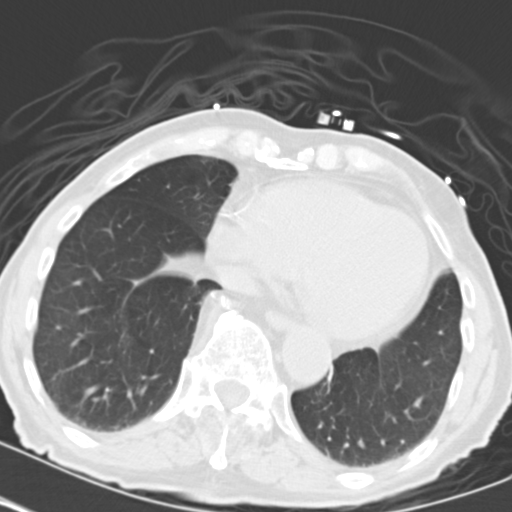
[im 20/67  lung]
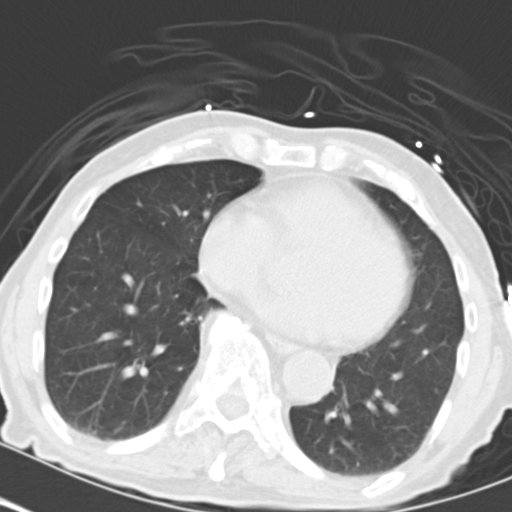
[im 25/67  mediastinal]
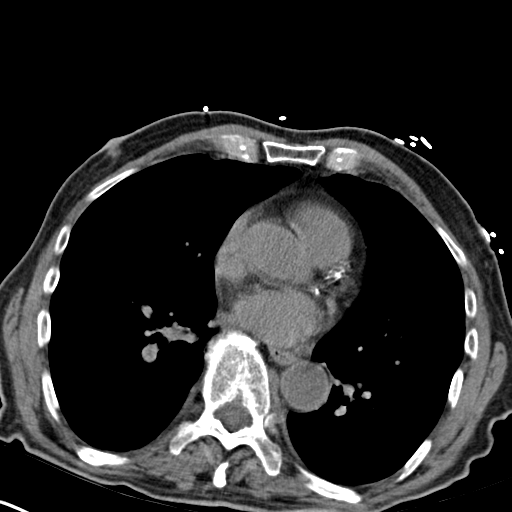
[im 25/67  lung]
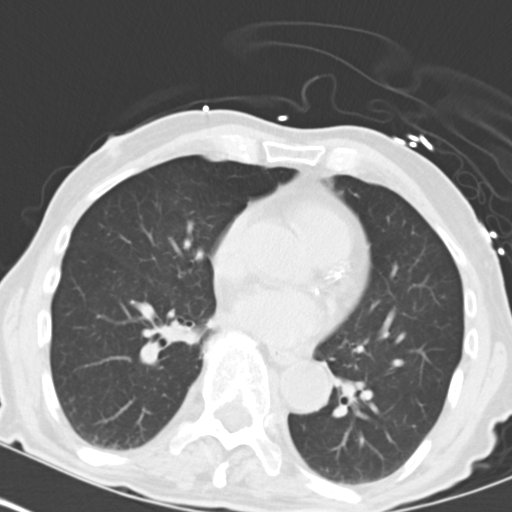
[im 30/67  lung]
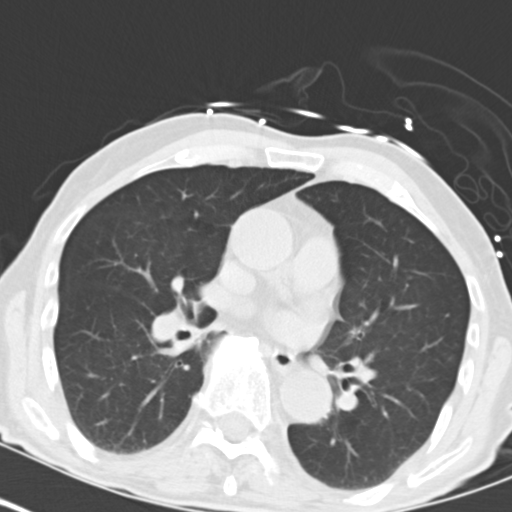
[im 37/67  lung]
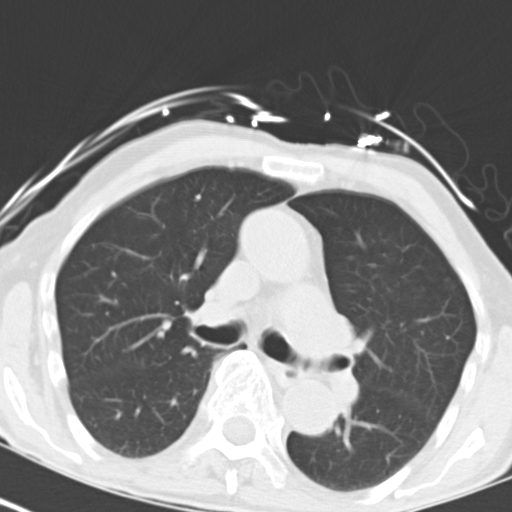
[im 42/67  lung]
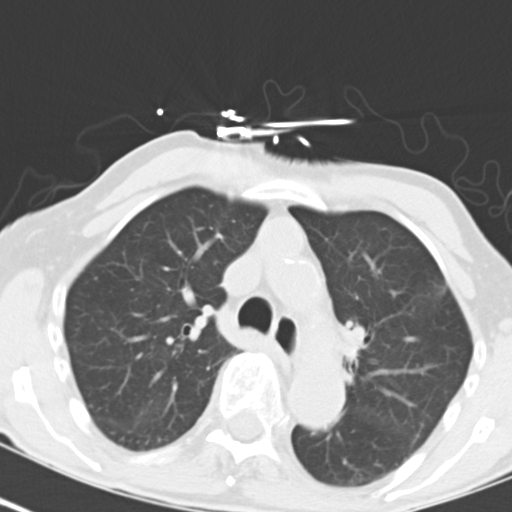
[im 47/67  mediastinal]
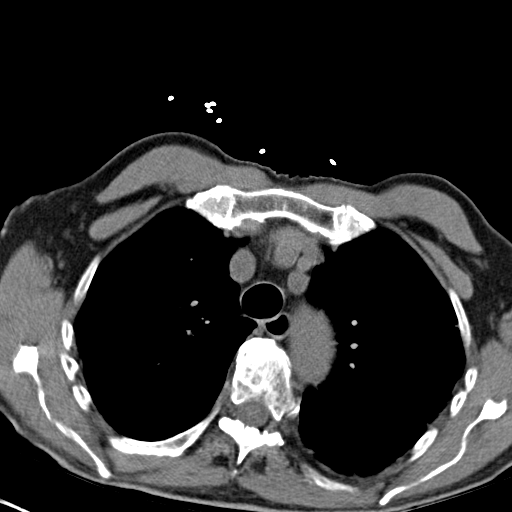
[im 47/67  lung]
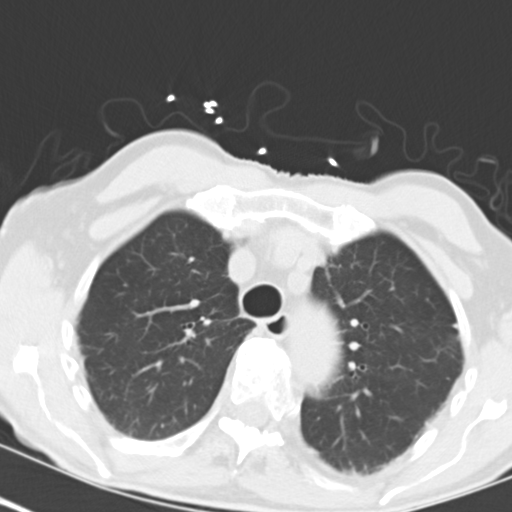
[im 52/67  lung]
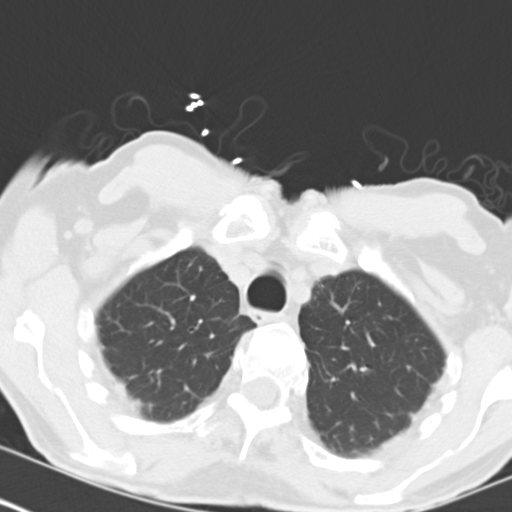
[im 57/67  lung]
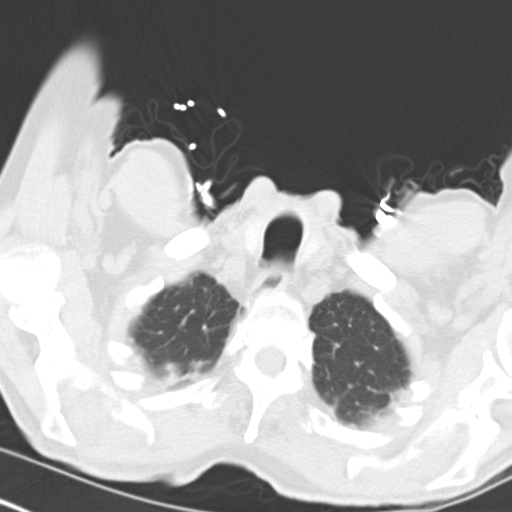
[im 62/67  lung]
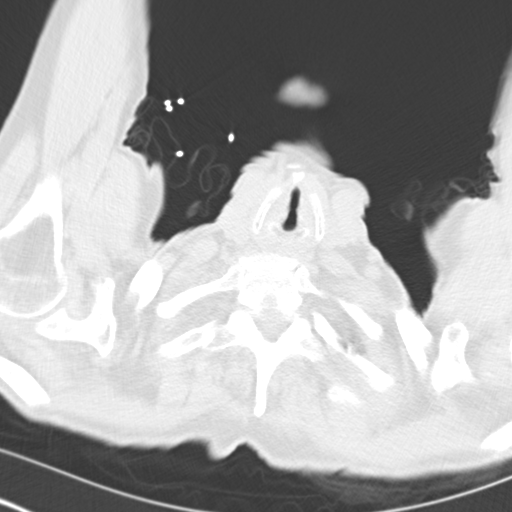

[Series 4: mpr coro 3mm · coronal · 0.58mm/px · 3 of 80 slices shown]
[im 16/80  lung]
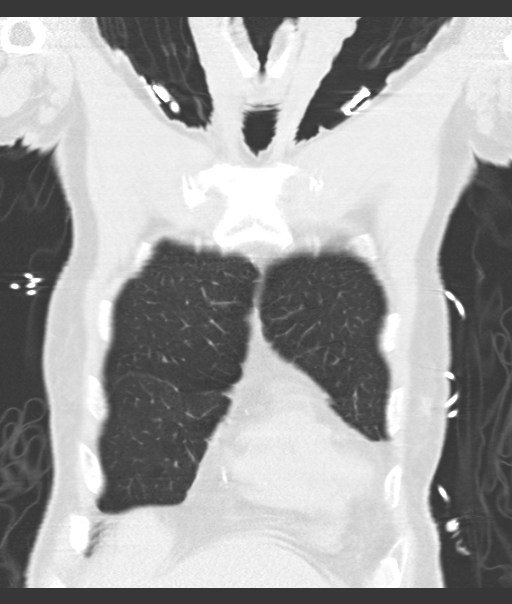
[im 32/80  lung]
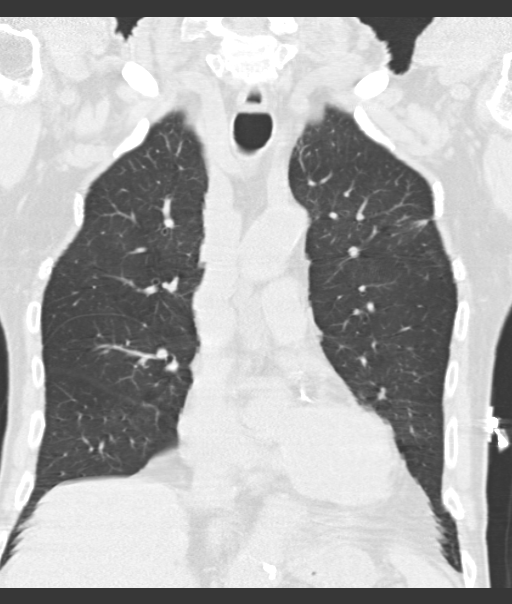
[im 48/80  lung]
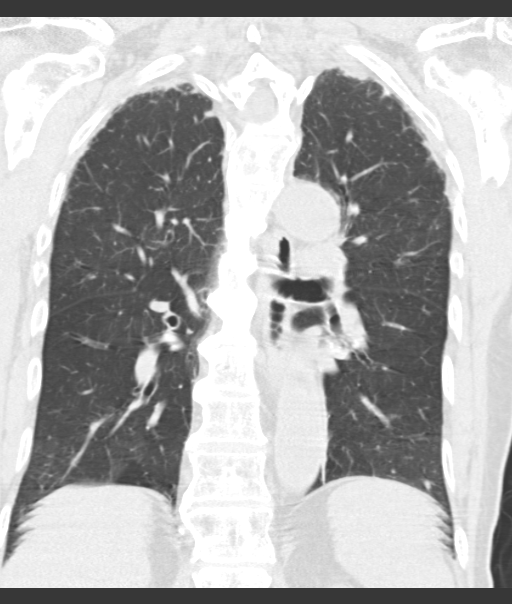

[15 of 36 positions shown; findings below may reference images not displayed]

FINDINGS: Mediastinum/Nodes: Normal heart size. Stable trace pericardial
fluid/ thickening. Three-vessel coronary atherosclerosis. Great
vessels are normal in course and caliber. Normal visualized thyroid.
Normal esophagus. No pathologically enlarged axillary, mediastinal
or gross hilar lymph nodes, noting limited sensitivity for the
detection of hilar adenopathy on this noncontrast study.

Lungs/Pleura: No pneumothorax. No pleural effusion. Stable tiny
calcified granulomas in the right upper, right middle and right
lower lobes. There is a new irregular 1.5 x 1.0 cm peripheral left
upper lobe pulmonary nodule (series 3/ image 23). No acute
consolidative airspace disease or additional significant pulmonary
nodules. Stable mild biapical pleural-parenchymal scarring.

Upper abdomen: Unremarkable.

Musculoskeletal: No aggressive appearing focal osseous lesions.
Marked degenerative changes in the thoracic spine.
IMPRESSION: 1. New irregular 1.5 x 1.0 cm peripheral left upper lobe pulmonary
nodule, for which a primary bronchogenic carcinoma cannot be
excluded. Recommend further evaluation with PET-CT.
2. No thoracic adenopathy.
3. Three-vessel coronary atherosclerosis.

## 2018-01-18 IMAGING — CT CT HEAD W/O CM
1 series · 16 of 30 positions shown, 20 images · non-contrast
Comparison: Head CT 11/01/2015 and 12/26/2014.

CLINICAL DATA: Increased weakness today. No known injury. Dementia.

EXAM:
CT HEAD WITHOUT CONTRAST
TECHNIQUE: Contiguous axial images were obtained from the base of the skull
through the vertex without intravenous contrast.

[Series 2: headseq 4.8 h37s · axial · 0.43mm/px · z∈[+1289,+1446]mm · 16 of 36 slices shown, 20 images]
[im 2/36  brain]
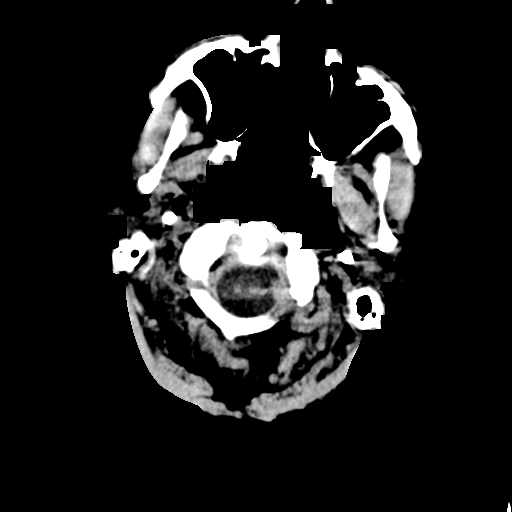
[im 2/36  bone]
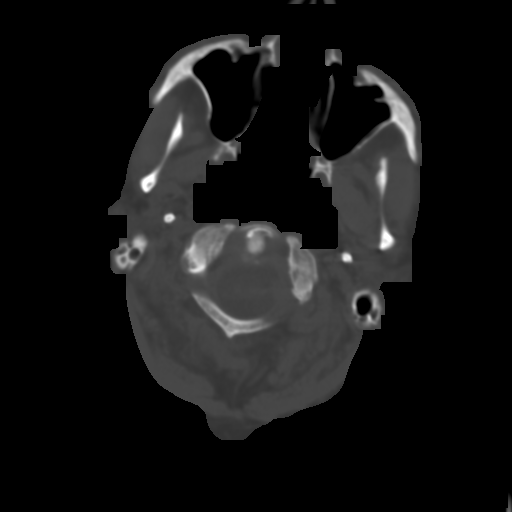
[im 4/36  brain]
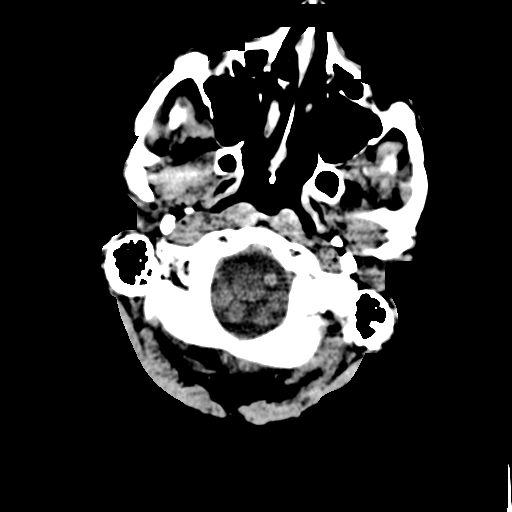
[im 7/36  brain]
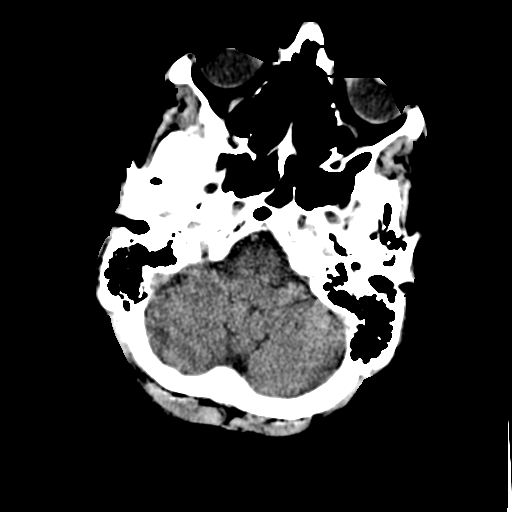
[im 9/36  brain]
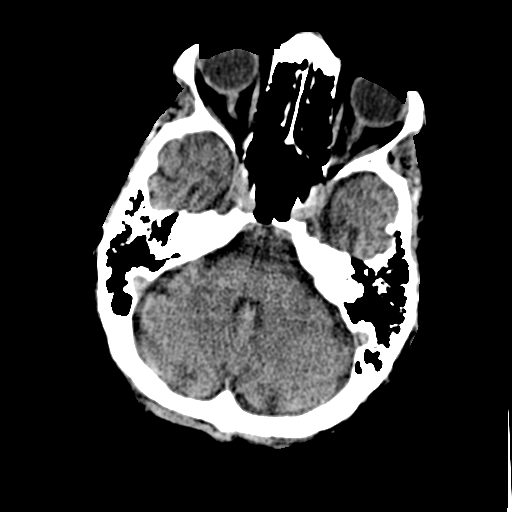
[im 10/36  brain]
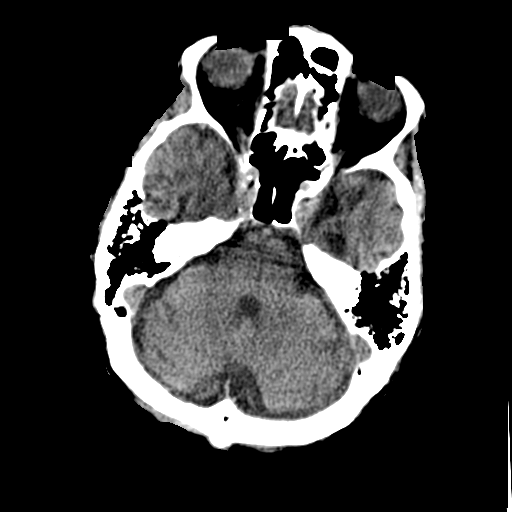
[im 10/36  bone]
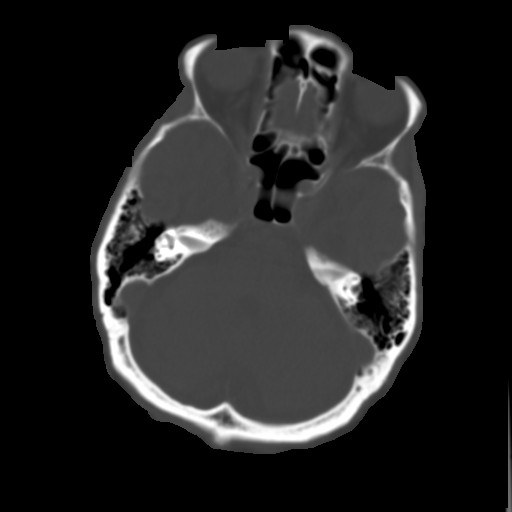
[im 13/36  brain]
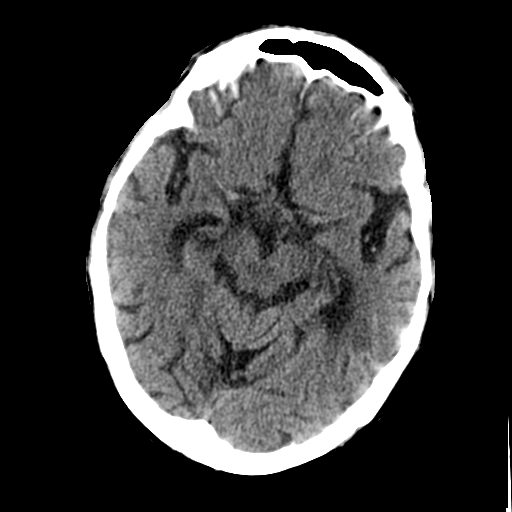
[im 15/36  brain]
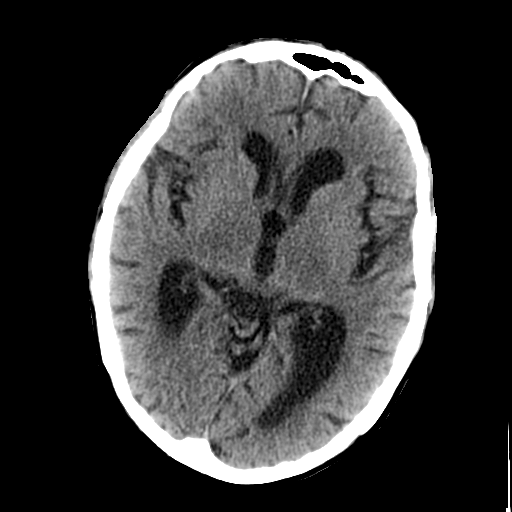
[im 17/36  brain]
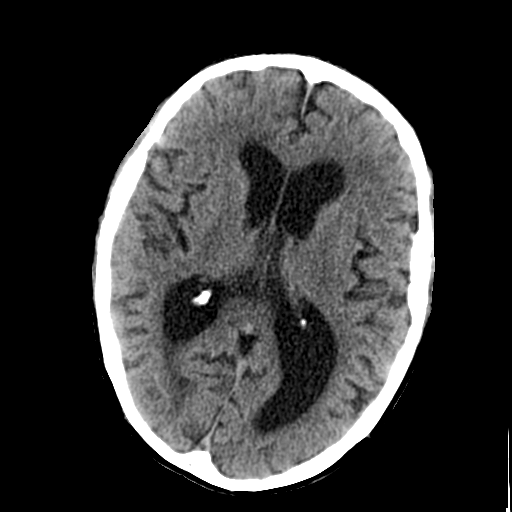
[im 19/36  brain]
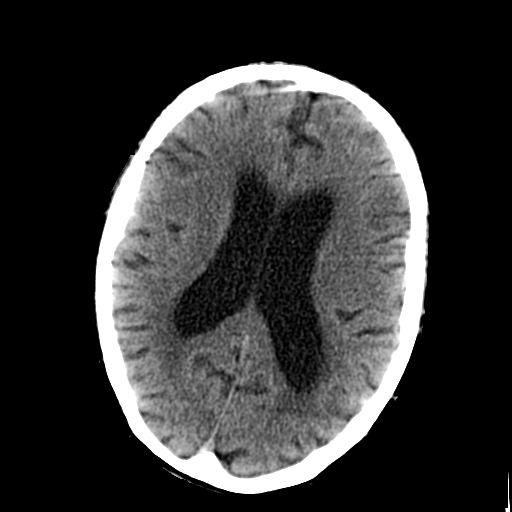
[im 19/36  bone]
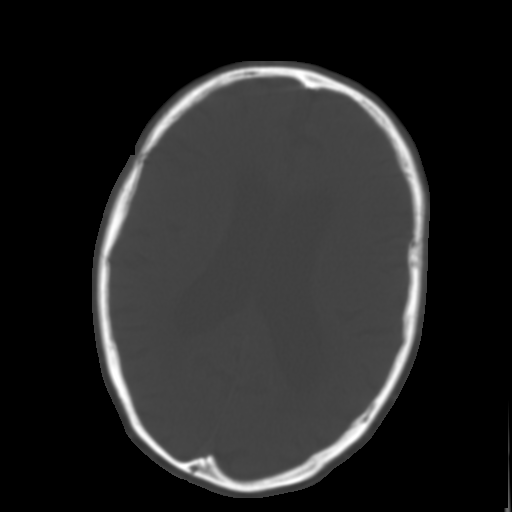
[im 21/36  brain]
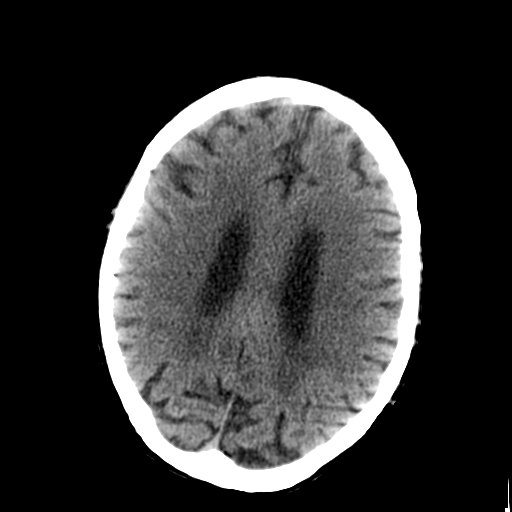
[im 23/36  brain]
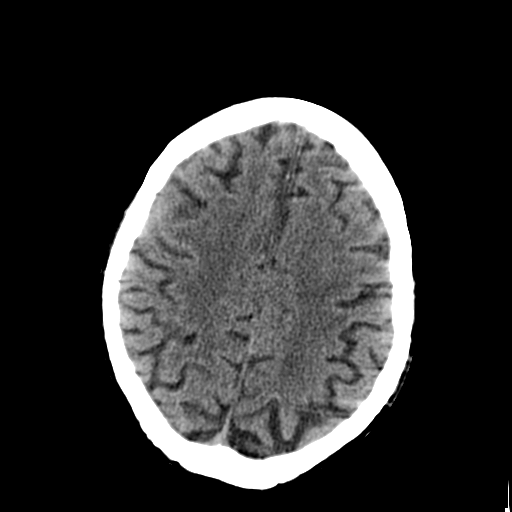
[im 26/36  brain]
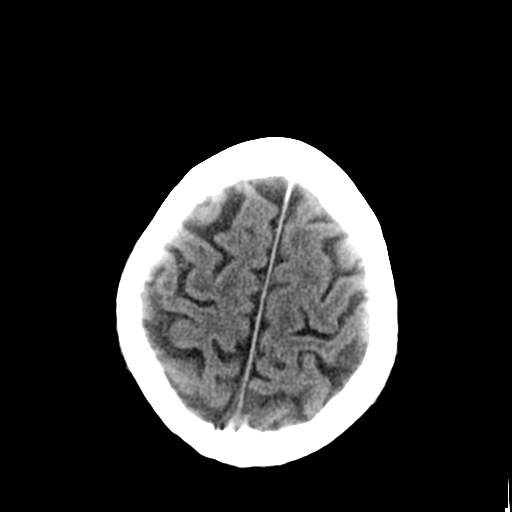
[im 27/36  brain]
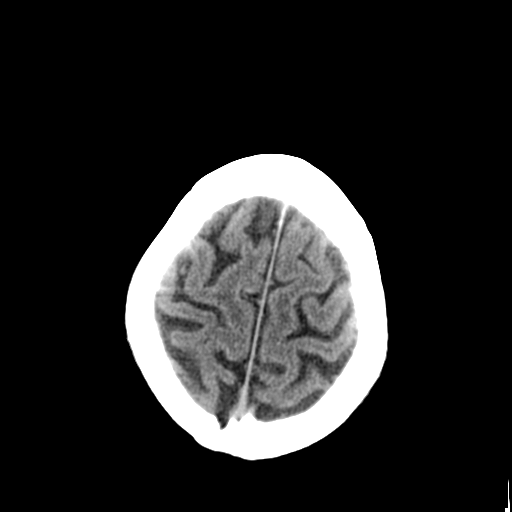
[im 27/36  bone]
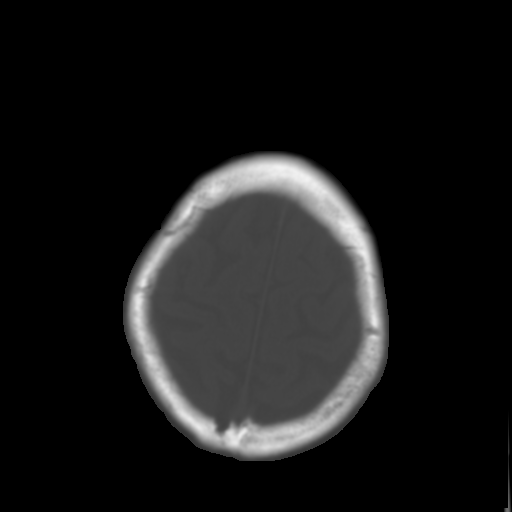
[im 29/36  brain]
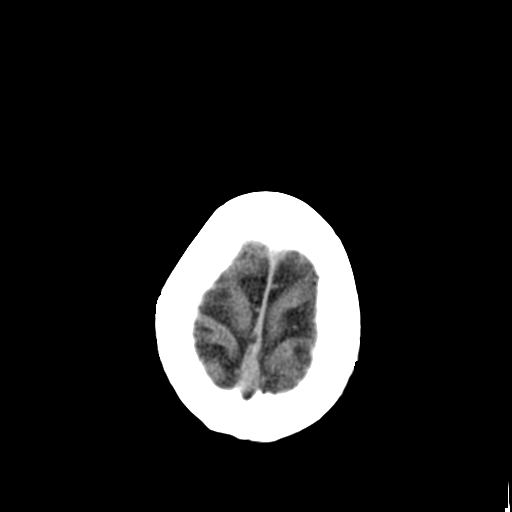
[im 32/36  brain]
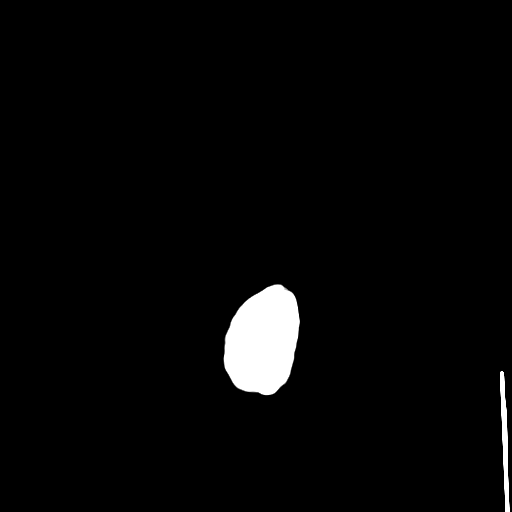
[im 34/36  brain]
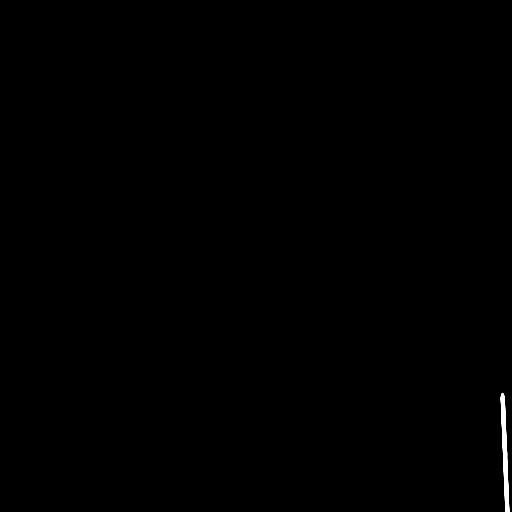

[16 of 30 positions shown; findings below may reference images not displayed]

FINDINGS: Examination is mildly motion degraded.

Brain: There is no evidence of acute intracranial hemorrhage, mass
lesion, brain edema or extra-axial fluid collection. The ventricles
and subarachnoid spaces are prominent but stable. Patchy and
confluent periventricular and subcortical white matter disease
appears unchanged. No evidence of acute infarct.

Bones/sinuses/visualized face: The visualized paranasal sinuses,
mastoid air cells and middle ears are clear. The calvarium is
intact.
IMPRESSION: Stable atrophy and chronic small vessel ischemic changes. No acute
intracranial findings.

## 2018-01-20 DIAGNOSIS — M1611 Unilateral primary osteoarthritis, right hip: Secondary | ICD-10-CM | POA: Diagnosis not present

## 2018-01-20 DIAGNOSIS — M6281 Muscle weakness (generalized): Secondary | ICD-10-CM | POA: Diagnosis not present

## 2018-01-20 DIAGNOSIS — G309 Alzheimer's disease, unspecified: Secondary | ICD-10-CM | POA: Diagnosis not present

## 2018-01-20 DIAGNOSIS — E039 Hypothyroidism, unspecified: Secondary | ICD-10-CM | POA: Diagnosis not present

## 2018-01-20 DIAGNOSIS — Z7901 Long term (current) use of anticoagulants: Secondary | ICD-10-CM | POA: Diagnosis not present

## 2018-01-20 DIAGNOSIS — F0280 Dementia in other diseases classified elsewhere without behavioral disturbance: Secondary | ICD-10-CM | POA: Diagnosis not present

## 2018-01-21 DIAGNOSIS — M6281 Muscle weakness (generalized): Secondary | ICD-10-CM | POA: Diagnosis not present

## 2018-01-21 DIAGNOSIS — Z7901 Long term (current) use of anticoagulants: Secondary | ICD-10-CM | POA: Diagnosis not present

## 2018-01-21 DIAGNOSIS — F0280 Dementia in other diseases classified elsewhere without behavioral disturbance: Secondary | ICD-10-CM | POA: Diagnosis not present

## 2018-01-21 DIAGNOSIS — M1611 Unilateral primary osteoarthritis, right hip: Secondary | ICD-10-CM | POA: Diagnosis not present

## 2018-01-21 DIAGNOSIS — G309 Alzheimer's disease, unspecified: Secondary | ICD-10-CM | POA: Diagnosis not present

## 2018-01-21 DIAGNOSIS — E039 Hypothyroidism, unspecified: Secondary | ICD-10-CM | POA: Diagnosis not present

## 2018-01-25 DIAGNOSIS — M6281 Muscle weakness (generalized): Secondary | ICD-10-CM | POA: Diagnosis not present

## 2018-01-25 DIAGNOSIS — M1611 Unilateral primary osteoarthritis, right hip: Secondary | ICD-10-CM | POA: Diagnosis not present

## 2018-01-25 DIAGNOSIS — Z7901 Long term (current) use of anticoagulants: Secondary | ICD-10-CM | POA: Diagnosis not present

## 2018-01-25 DIAGNOSIS — G309 Alzheimer's disease, unspecified: Secondary | ICD-10-CM | POA: Diagnosis not present

## 2018-01-25 DIAGNOSIS — E039 Hypothyroidism, unspecified: Secondary | ICD-10-CM | POA: Diagnosis not present

## 2018-01-25 DIAGNOSIS — F0280 Dementia in other diseases classified elsewhere without behavioral disturbance: Secondary | ICD-10-CM | POA: Diagnosis not present

## 2018-01-27 DIAGNOSIS — F0280 Dementia in other diseases classified elsewhere without behavioral disturbance: Secondary | ICD-10-CM | POA: Diagnosis not present

## 2018-01-27 DIAGNOSIS — E039 Hypothyroidism, unspecified: Secondary | ICD-10-CM | POA: Diagnosis not present

## 2018-01-27 DIAGNOSIS — G309 Alzheimer's disease, unspecified: Secondary | ICD-10-CM | POA: Diagnosis not present

## 2018-01-27 DIAGNOSIS — Z7901 Long term (current) use of anticoagulants: Secondary | ICD-10-CM | POA: Diagnosis not present

## 2018-01-27 DIAGNOSIS — M6281 Muscle weakness (generalized): Secondary | ICD-10-CM | POA: Diagnosis not present

## 2018-01-27 DIAGNOSIS — M1611 Unilateral primary osteoarthritis, right hip: Secondary | ICD-10-CM | POA: Diagnosis not present

## 2018-01-28 DIAGNOSIS — F0280 Dementia in other diseases classified elsewhere without behavioral disturbance: Secondary | ICD-10-CM | POA: Diagnosis not present

## 2018-01-28 DIAGNOSIS — G309 Alzheimer's disease, unspecified: Secondary | ICD-10-CM | POA: Diagnosis not present

## 2018-01-28 DIAGNOSIS — M6281 Muscle weakness (generalized): Secondary | ICD-10-CM | POA: Diagnosis not present

## 2018-01-28 DIAGNOSIS — M1611 Unilateral primary osteoarthritis, right hip: Secondary | ICD-10-CM | POA: Diagnosis not present

## 2018-01-30 DIAGNOSIS — M6281 Muscle weakness (generalized): Secondary | ICD-10-CM | POA: Diagnosis not present

## 2018-01-30 DIAGNOSIS — E039 Hypothyroidism, unspecified: Secondary | ICD-10-CM | POA: Diagnosis not present

## 2018-01-30 DIAGNOSIS — M1611 Unilateral primary osteoarthritis, right hip: Secondary | ICD-10-CM | POA: Diagnosis not present

## 2018-01-30 DIAGNOSIS — G309 Alzheimer's disease, unspecified: Secondary | ICD-10-CM | POA: Diagnosis not present

## 2018-01-30 DIAGNOSIS — Z7901 Long term (current) use of anticoagulants: Secondary | ICD-10-CM | POA: Diagnosis not present

## 2018-01-30 DIAGNOSIS — F0280 Dementia in other diseases classified elsewhere without behavioral disturbance: Secondary | ICD-10-CM | POA: Diagnosis not present

## 2018-02-01 ENCOUNTER — Inpatient Hospital Stay (HOSPITAL_COMMUNITY)
Admission: EM | Admit: 2018-02-01 | Discharge: 2018-02-28 | DRG: 083 | Disposition: E | Payer: Medicare Other | Attending: Neurological Surgery | Admitting: Neurological Surgery

## 2018-02-01 ENCOUNTER — Other Ambulatory Visit: Payer: Self-pay

## 2018-02-01 ENCOUNTER — Encounter (HOSPITAL_COMMUNITY): Payer: Self-pay | Admitting: *Deleted

## 2018-02-01 ENCOUNTER — Emergency Department (HOSPITAL_COMMUNITY): Payer: Medicare Other

## 2018-02-01 DIAGNOSIS — Z881 Allergy status to other antibiotic agents status: Secondary | ICD-10-CM

## 2018-02-01 DIAGNOSIS — Z515 Encounter for palliative care: Secondary | ICD-10-CM | POA: Diagnosis present

## 2018-02-01 DIAGNOSIS — B353 Tinea pedis: Secondary | ICD-10-CM | POA: Diagnosis not present

## 2018-02-01 DIAGNOSIS — K0889 Other specified disorders of teeth and supporting structures: Secondary | ICD-10-CM | POA: Diagnosis present

## 2018-02-01 DIAGNOSIS — S020XXA Fracture of vault of skull, initial encounter for closed fracture: Secondary | ICD-10-CM | POA: Diagnosis not present

## 2018-02-01 DIAGNOSIS — G301 Alzheimer's disease with late onset: Secondary | ICD-10-CM | POA: Diagnosis not present

## 2018-02-01 DIAGNOSIS — Z8701 Personal history of pneumonia (recurrent): Secondary | ICD-10-CM | POA: Diagnosis not present

## 2018-02-01 DIAGNOSIS — S12001A Unspecified nondisplaced fracture of first cervical vertebra, initial encounter for closed fracture: Secondary | ICD-10-CM | POA: Diagnosis not present

## 2018-02-01 DIAGNOSIS — Z9181 History of falling: Secondary | ICD-10-CM | POA: Diagnosis not present

## 2018-02-01 DIAGNOSIS — R296 Repeated falls: Secondary | ICD-10-CM | POA: Diagnosis present

## 2018-02-01 DIAGNOSIS — S0291XA Unspecified fracture of skull, initial encounter for closed fracture: Secondary | ICD-10-CM | POA: Diagnosis present

## 2018-02-01 DIAGNOSIS — S0219XA Other fracture of base of skull, initial encounter for closed fracture: Secondary | ICD-10-CM

## 2018-02-01 DIAGNOSIS — S066X9A Traumatic subarachnoid hemorrhage with loss of consciousness of unspecified duration, initial encounter: Principal | ICD-10-CM | POA: Diagnosis present

## 2018-02-01 DIAGNOSIS — W06XXXA Fall from bed, initial encounter: Secondary | ICD-10-CM | POA: Diagnosis present

## 2018-02-01 DIAGNOSIS — Z66 Do not resuscitate: Secondary | ICD-10-CM | POA: Diagnosis present

## 2018-02-01 DIAGNOSIS — Z7901 Long term (current) use of anticoagulants: Secondary | ICD-10-CM | POA: Diagnosis not present

## 2018-02-01 DIAGNOSIS — S066XAA Traumatic subarachnoid hemorrhage with loss of consciousness status unknown, initial encounter: Secondary | ICD-10-CM | POA: Diagnosis present

## 2018-02-01 DIAGNOSIS — Z79899 Other long term (current) drug therapy: Secondary | ICD-10-CM

## 2018-02-01 DIAGNOSIS — Z9049 Acquired absence of other specified parts of digestive tract: Secondary | ICD-10-CM | POA: Diagnosis not present

## 2018-02-01 DIAGNOSIS — S12000A Unspecified displaced fracture of first cervical vertebra, initial encounter for closed fracture: Secondary | ICD-10-CM | POA: Diagnosis present

## 2018-02-01 DIAGNOSIS — Z993 Dependence on wheelchair: Secondary | ICD-10-CM

## 2018-02-01 DIAGNOSIS — Z23 Encounter for immunization: Secondary | ICD-10-CM | POA: Diagnosis not present

## 2018-02-01 DIAGNOSIS — S12301A Unspecified nondisplaced fracture of fourth cervical vertebra, initial encounter for closed fracture: Secondary | ICD-10-CM | POA: Diagnosis not present

## 2018-02-01 DIAGNOSIS — S0990XA Unspecified injury of head, initial encounter: Secondary | ICD-10-CM | POA: Diagnosis not present

## 2018-02-01 DIAGNOSIS — S12390A Other displaced fracture of fourth cervical vertebra, initial encounter for closed fracture: Secondary | ICD-10-CM | POA: Diagnosis present

## 2018-02-01 DIAGNOSIS — F0280 Dementia in other diseases classified elsewhere without behavioral disturbance: Secondary | ICD-10-CM | POA: Diagnosis present

## 2018-02-01 DIAGNOSIS — R000 Tachycardia, unspecified: Secondary | ICD-10-CM | POA: Diagnosis present

## 2018-02-01 DIAGNOSIS — Z882 Allergy status to sulfonamides status: Secondary | ICD-10-CM

## 2018-02-01 DIAGNOSIS — S0993XA Unspecified injury of face, initial encounter: Secondary | ICD-10-CM | POA: Diagnosis not present

## 2018-02-01 DIAGNOSIS — G309 Alzheimer's disease, unspecified: Secondary | ICD-10-CM | POA: Diagnosis not present

## 2018-02-01 DIAGNOSIS — M542 Cervicalgia: Secondary | ICD-10-CM | POA: Diagnosis not present

## 2018-02-01 DIAGNOSIS — Z86718 Personal history of other venous thrombosis and embolism: Secondary | ICD-10-CM

## 2018-02-01 DIAGNOSIS — I872 Venous insufficiency (chronic) (peripheral): Secondary | ICD-10-CM | POA: Diagnosis present

## 2018-02-01 DIAGNOSIS — S12031A Nondisplaced posterior arch fracture of first cervical vertebra, initial encounter for closed fracture: Secondary | ICD-10-CM | POA: Diagnosis not present

## 2018-02-01 DIAGNOSIS — S12030A Displaced posterior arch fracture of first cervical vertebra, initial encounter for closed fracture: Secondary | ICD-10-CM

## 2018-02-01 DIAGNOSIS — Y92122 Bedroom in nursing home as the place of occurrence of the external cause: Secondary | ICD-10-CM | POA: Diagnosis not present

## 2018-02-01 DIAGNOSIS — R51 Headache: Secondary | ICD-10-CM | POA: Diagnosis not present

## 2018-02-01 DIAGNOSIS — I609 Nontraumatic subarachnoid hemorrhage, unspecified: Secondary | ICD-10-CM

## 2018-02-01 DIAGNOSIS — S199XXA Unspecified injury of neck, initial encounter: Secondary | ICD-10-CM | POA: Diagnosis not present

## 2018-02-01 DIAGNOSIS — S299XXA Unspecified injury of thorax, initial encounter: Secondary | ICD-10-CM | POA: Diagnosis not present

## 2018-02-01 DIAGNOSIS — Z9089 Acquired absence of other organs: Secondary | ICD-10-CM

## 2018-02-01 DIAGNOSIS — S066X0A Traumatic subarachnoid hemorrhage without loss of consciousness, initial encounter: Secondary | ICD-10-CM | POA: Diagnosis not present

## 2018-02-01 DIAGNOSIS — S098XXA Other specified injuries of head, initial encounter: Secondary | ICD-10-CM | POA: Diagnosis not present

## 2018-02-01 DIAGNOSIS — F411 Generalized anxiety disorder: Secondary | ICD-10-CM

## 2018-02-01 LAB — PROTIME-INR
INR: 1.08
PROTHROMBIN TIME: 13.9 s (ref 11.4–15.2)

## 2018-02-01 LAB — BASIC METABOLIC PANEL
Anion gap: 10 (ref 5–15)
BUN: 22 mg/dL — AB (ref 6–20)
CALCIUM: 9.3 mg/dL (ref 8.9–10.3)
CHLORIDE: 100 mmol/L — AB (ref 101–111)
CO2: 26 mmol/L (ref 22–32)
CREATININE: 1.12 mg/dL (ref 0.61–1.24)
GFR calc Af Amer: >60 mL/min (ref >60)
GFR calc non Af Amer: 56 mL/min — ABNORMAL LOW (ref >60)
GLUCOSE: 95 mg/dL (ref 65–99)
Potassium: 3.8 mmol/L (ref 3.5–5.1)
Sodium: 136 mmol/L (ref 135–145)

## 2018-02-01 LAB — CBC WITH DIFFERENTIAL/PLATELET
BASOS PCT: 0 %
Basophils Absolute: 0 10*3/uL (ref 0.0–0.1)
EOS ABS: 0 10*3/uL (ref 0.0–0.7)
Eosinophils Relative: 0 %
HCT: 41.7 % (ref 39.0–52.0)
Hemoglobin: 13 g/dL (ref 13.0–17.0)
LYMPHS ABS: 0.5 10*3/uL — AB (ref 0.7–4.0)
Lymphocytes Relative: 6 %
MCH: 26.5 pg (ref 26.0–34.0)
MCHC: 31.2 g/dL (ref 30.0–36.0)
MCV: 84.9 fL (ref 78.0–100.0)
MONO ABS: 1 10*3/uL (ref 0.1–1.0)
MONOS PCT: 12 %
Neutro Abs: 6.7 10*3/uL (ref 1.7–7.7)
Neutrophils Relative %: 82 %
Platelets: 158 10*3/uL (ref 150–400)
RBC: 4.91 MIL/uL (ref 4.22–5.81)
RDW: 15.1 % (ref 11.5–15.5)
WBC: 8.2 10*3/uL (ref 4.0–10.5)

## 2018-02-01 LAB — MRSA PCR SCREENING: MRSA by PCR: NEGATIVE

## 2018-02-01 MED ORDER — MICONAZOLE NITRATE 2 % EX CREA
TOPICAL_CREAM | Freq: Two times a day (BID) | CUTANEOUS | Status: DC
  Administered 2018-02-01 – 2018-02-04 (×7): via TOPICAL
  Filled 2018-02-01: qty 14

## 2018-02-01 MED ORDER — TETANUS-DIPHTH-ACELL PERTUSSIS 5-2.5-18.5 LF-MCG/0.5 IM SUSP
0.5000 mL | Freq: Once | INTRAMUSCULAR | Status: AC
  Administered 2018-02-01: 0.5 mL via INTRAMUSCULAR
  Filled 2018-02-01: qty 0.5

## 2018-02-01 MED ORDER — TRIAMCINOLONE 0.1 % CREAM:EUCERIN CREAM 1:1
TOPICAL_CREAM | Freq: Three times a day (TID) | CUTANEOUS | Status: DC
  Administered 2018-02-01 – 2018-02-04 (×7): via TOPICAL
  Filled 2018-02-01 (×2): qty 1

## 2018-02-01 MED ORDER — SODIUM CHLORIDE 0.9 % IV SOLN
Freq: Once | INTRAVENOUS | Status: AC
  Administered 2018-02-01: 12:00:00 via INTRAVENOUS

## 2018-02-01 MED ORDER — FENTANYL CITRATE (PF) 100 MCG/2ML IJ SOLN
25.0000 ug | INTRAMUSCULAR | Status: AC | PRN
  Administered 2018-02-01 (×2): 25 ug via INTRAVENOUS
  Filled 2018-02-01 (×2): qty 2

## 2018-02-01 NOTE — H&P (Addendum)
[name] is an 84 y.o. male.   Chief Complaint: No complaints HPI: Patient is an 84 year old individual who apparently had a fall at his care facility.  He had struck his head.  He has a contusion about his eye.  A CT scan of the brain demonstrated presence of a large left frontal subgaleal hematoma in addition to a small amount of some subarachnoid blood in the basal frontal region.  A CT scan of the cervical spine suggested a fracture of C1 with no dislocation of the fragments.  There is also a fracture of C4 through the foramen on the right side.  The alignment of the cervical spine remains good on the CT scan.  The patient was transferred here from [facility].  He has been on Eliquis for history of DVTs some 5 years ago.  Past Medical History:  Diagnosis Date  . Arthritis, hip   . Assistance needed for mobility    walks with walker  . DVT (deep venous thrombosis) ([place]) 2016  . Mild dementia   . Never smoked any substance   . Pneumonia 2002, 2010    Past Surgical History:  Procedure Laterality Date  . APPENDECTOMY     At age 38  . HIP SURGERY     left  . TONSILLECTOMY     As a child    History reviewed. No pertinent family history. Social History:  reports that  has never smoked. he has never used smokeless tobacco. He reports that he does not drink alcohol or use drugs.  Allergies:  Allergies  Allergen Reactions  . Ciprofloxacin Other (See Comments)    Reaction:  Neurological changes   . Keflex [Cephalexin] Nausea And Vomiting  . Sulfa Antibiotics Other (See Comments)    Reaction:  Unknown     Medications Prior to Admission  Medication Sig Dispense Refill  . calcium carbonate (TUMS EX) 750 MG chewable tablet Chew 2 tablets by mouth every 2 (two) hours as needed for heartburn.    Marland Kitchen ELIQUIS 2.5 MG TABS tablet TAKE ONE TABLET BY MOUTH TWICE DAILY. 60 tablet 2  . nitroGLYCERIN (NITROSTAT) 0.4 MG SL tablet Place 1 tablet (0.4 mg total) under the tongue every 5  (five) minutes as needed for chest pain. (Patient not taking: Reported on 01/30/2018) 50 tablet 0  . olopatadine (PATANOL) 0.1 % ophthalmic solution Place 1 drop into both eyes 2 (two) times daily. (Patient not taking: Reported on 01/30/2018) 5 mL 12    Results for orders placed or performed during the hospital encounter of 02/23/2018 (from the past 48 hour(s))  Basic metabolic panel     Status: Abnormal   Collection Time: 02/19/2018  5:13 AM  Result Value Ref Range   Sodium 136 135 - 145 mmol/L   Potassium 3.8 3.5 - 5.1 mmol/L   Chloride 100 (L) 101 - 111 mmol/L   CO2 26 22 - 32 mmol/L   Glucose, Bld 95 65 - 99 mg/dL   BUN 22 (H) 6 - 20 mg/dL   Creatinine, Ser 1.12 0.61 - 1.24 mg/dL   Calcium 9.3 8.9 - 10.3 mg/dL   GFR calc non Af Amer 56 (L) >60 mL/min   GFR calc Af Amer >60 >60 mL/min    Comment: (NOTE) The eGFR has been calculated using the CKD EPI equation. This calculation has not been validated in all clinical situations. eGFR's persistently <60 mL/min signify possible Chronic Kidney Disease.    Anion gap 10 5 -  15    Comment: Performed at [facility], [address] [place], [place] [address]  CBC with Differential/Platelet     Status: Abnormal   Collection Time: 01/28/2018  5:13 AM  Result Value Ref Range   WBC 8.2 4.0 - 10.5 K/uL   RBC 4.91 4.22 - 5.81 MIL/uL   Hemoglobin 13.0 13.0 - 17.0 g/dL   HCT 41.7 39.0 - 52.0 %   MCV 84.9 78.0 - 100.0 fL   MCH 26.5 26.0 - 34.0 pg   MCHC 31.2 30.0 - 36.0 g/dL   RDW 15.1 11.5 - 15.5 %   Platelets 158 150 - 400 K/uL   Neutrophils Relative % 82 %   Neutro Abs 6.7 1.7 - 7.7 K/uL   Lymphocytes Relative 6 %   Lymphs Abs 0.5 (L) 0.7 - 4.0 K/uL   Monocytes Relative 12 %   Monocytes Absolute 1.0 0.1 - 1.0 K/uL   Eosinophils Relative 0 %   Eosinophils Absolute 0.0 0.0 - 0.7 K/uL   Basophils Relative 0 %   Basophils Absolute 0.0 0.0 - 0.1 K/uL    Comment: Performed at [facility], [address], [place], [place] [address]  Protime-INR      Status: None   Collection Time: 02/23/2018  5:13 AM  Result Value Ref Range   Prothrombin Time 13.9 11.4 - 15.2 seconds   INR 1.08     Comment: Performed at [facility], [address], [place], [place] [address]  MRSA PCR Screening     Status: None   Collection Time: 02/27/2018 11:26 AM  Result Value Ref Range   MRSA by PCR NEGATIVE NEGATIVE    Comment:        The GeneXpert MRSA Assay (FDA approved for NASAL specimens only), is one component of a comprehensive MRSA colonization surveillance program. It is not intended to diagnose MRSA infection nor to guide or monitor treatment for MRSA infections. Performed at [facility], [place] [address] [place], [place] [address]    Ct Head Wo Contrast  Result Date: 02/03/2018 CLINICAL DATA:  Post unwitnessed fall with head and neck pain and left periorbital bruising. EXAM: CT HEAD WITHOUT CONTRAST CT MAXILLOFACIAL WITHOUT CONTRAST CT CERVICAL SPINE WITHOUT CONTRAST TECHNIQUE: Multidetector CT imaging of the head, cervical spine, and maxillofacial structures were performed using the standard protocol without intravenous contrast. Multiplanar CT image reconstructions of the cervical spine and maxillofacial structures were also generated. COMPARISON:  Head CT 02/24/2016 FINDINGS: CT HEAD FINDINGS Brain: Small volume subarachnoid hemorrhage in the left frontal lobe subjacent to frontal bone fracture. No pneumocephalus. No surrounding edema or mass effect. No subdural hematoma. Unchanged general atrophy and chronic small vessel ischemia. No midline shift or mass effect. Vascular: No hyperdense vessel or unexpected calcification. Skull: Nondisplaced left frontal bone fracture with large associated scalp hematoma. Other: Large frontal scalp hematoma. CT MAXILLOFACIAL FINDINGS Osseous: Left frontal bone fracture extends through the frontal sinus and left superior orbit. Zygomatic arches, nasal bone, and mandibles are intact. Temporomandibular joints are  congruent with chronic degenerative change. Scattered dental caries. Orbits: Left frontal bone fracture extends through the superior left orbital wall. Both globes are intact. No right orbital fracture. Sinuses: Left frontal bone fracture extends through the left frontal sinus. Small amount of hemorrhage in the left frontal and sphenoid sinuses related to fracture. No additional sinus fracture. Soft tissues: Left supraorbital and frontal scalp hematoma. CT CERVICAL SPINE FINDINGS Alignment: No evidence of traumatic subluxation. Mild retrolisthesis of C4 on  C5. Moderate dextroscoliotic curvature. Skull base and vertebrae: Complex C1 fracture involves the right and left anterior and right posterior arch. Minimally displaced fracture through the left C4 transverse process extends through the vertebral foramen. The dens and vertebral body heights are preserved. No occipital condylar fracture. Soft tissues and spinal canal: Prevertebral soft tissue edema adjacent to C1 fracture. No evidence of canal hematoma. Disc levels: Diffuse disc space narrowing and endplate spurring. Multilevel facet arthropathy. Upper chest: Biapical pleuroparenchymal scarring. Other: None. IMPRESSION: 1. Nondisplaced left frontal bone fracture with small focus of left frontal subarachnoid hemorrhage. 2. Left frontal bone fracture extends through the frontal sinus in the superior orbit. No additional facial bone fracture. 3. Comminuted C1 fracture involving the right and left anterior and right posterior arch. 4. Minimally displaced left C4 transverse process fracture extends through the vertebral foramen. Recommend neck CTA. Critical Value/emergent results were called by telephone at the time of interpretation on 02/15/2018 at 5:06 am to Dr. Ripley Fraise , who verbally acknowledged these results. Electronically Signed   By: Jeb Levering M.D.   On: 02/05/2018 05:07   Ct Cervical Spine Wo Contrast  Result Date: 02/14/2018 CLINICAL DATA:   Post unwitnessed fall with head and neck pain and left periorbital bruising. EXAM: CT HEAD WITHOUT CONTRAST CT MAXILLOFACIAL WITHOUT CONTRAST CT CERVICAL SPINE WITHOUT CONTRAST TECHNIQUE: Multidetector CT imaging of the head, cervical spine, and maxillofacial structures were performed using the standard protocol without intravenous contrast. Multiplanar CT image reconstructions of the cervical spine and maxillofacial structures were also generated. COMPARISON:  Head CT 02/24/2016 FINDINGS: CT HEAD FINDINGS Brain: Small volume subarachnoid hemorrhage in the left frontal lobe subjacent to frontal bone fracture. No pneumocephalus. No surrounding edema or mass effect. No subdural hematoma. Unchanged general atrophy and chronic small vessel ischemia. No midline shift or mass effect. Vascular: No hyperdense vessel or unexpected calcification. Skull: Nondisplaced left frontal bone fracture with large associated scalp hematoma. Other: Large frontal scalp hematoma. CT MAXILLOFACIAL FINDINGS Osseous: Left frontal bone fracture extends through the frontal sinus and left superior orbit. Zygomatic arches, nasal bone, and mandibles are intact. Temporomandibular joints are congruent with chronic degenerative change. Scattered dental caries. Orbits: Left frontal bone fracture extends through the superior left orbital wall. Both globes are intact. No right orbital fracture. Sinuses: Left frontal bone fracture extends through the left frontal sinus. Small amount of hemorrhage in the left frontal and sphenoid sinuses related to fracture. No additional sinus fracture. Soft tissues: Left supraorbital and frontal scalp hematoma. CT CERVICAL SPINE FINDINGS Alignment: No evidence of traumatic subluxation. Mild retrolisthesis of C4 on C5. Moderate dextroscoliotic curvature. Skull base and vertebrae: Complex C1 fracture involves the right and left anterior and right posterior arch. Minimally displaced fracture through the left C4 transverse  process extends through the vertebral foramen. The dens and vertebral body heights are preserved. No occipital condylar fracture. Soft tissues and spinal canal: Prevertebral soft tissue edema adjacent to C1 fracture. No evidence of canal hematoma. Disc levels: Diffuse disc space narrowing and endplate spurring. Multilevel facet arthropathy. Upper chest: Biapical pleuroparenchymal scarring. Other: None. IMPRESSION: 1. Nondisplaced left frontal bone fracture with small focus of left frontal subarachnoid hemorrhage. 2. Left frontal bone fracture extends through the frontal sinus in the superior orbit. No additional facial bone fracture. 3. Comminuted C1 fracture involving the right and left anterior and right posterior arch. 4. Minimally displaced left C4 transverse process fracture extends through the vertebral foramen. Recommend neck CTA. Critical Value/emergent results were called by  telephone at the time of interpretation on 02/03/2018 at 5:06 am to Dr. Ripley Fraise , who verbally acknowledged these results. Electronically Signed   By: Jeb Levering M.D.   On: 01/29/2018 05:07   Dg Chest Port 1 View  Result Date: 02/24/2018 CLINICAL DATA:  Fall. EXAM: PORTABLE CHEST 1 VIEW COMPARISON:  Radiographs 10/07/2017 FINDINGS: Mild vascular congestion. Mild cardiomegaly with normal mediastinal contours. Previous right upper lung consolidation has resolved. No pneumothorax, pleural effusion, or new airspace disease. No acute osseous abnormalities are seen. IMPRESSION: Mild cardiomegaly and vascular congestion. No evidence of traumatic injury. Electronically Signed   By: Jeb Levering M.D.   On: 01/30/2018 06:05   Ct Maxillofacial Wo Contrast  Result Date: 02/26/2018 CLINICAL DATA:  Post unwitnessed fall with head and neck pain and left periorbital bruising. EXAM: CT HEAD WITHOUT CONTRAST CT MAXILLOFACIAL WITHOUT CONTRAST CT CERVICAL SPINE WITHOUT CONTRAST TECHNIQUE: Multidetector CT imaging of the head, cervical  spine, and maxillofacial structures were performed using the standard protocol without intravenous contrast. Multiplanar CT image reconstructions of the cervical spine and maxillofacial structures were also generated. COMPARISON:  Head CT 02/24/2016 FINDINGS: CT HEAD FINDINGS Brain: Small volume subarachnoid hemorrhage in the left frontal lobe subjacent to frontal bone fracture. No pneumocephalus. No surrounding edema or mass effect. No subdural hematoma. Unchanged general atrophy and chronic small vessel ischemia. No midline shift or mass effect. Vascular: No hyperdense vessel or unexpected calcification. Skull: Nondisplaced left frontal bone fracture with large associated scalp hematoma. Other: Large frontal scalp hematoma. CT MAXILLOFACIAL FINDINGS Osseous: Left frontal bone fracture extends through the frontal sinus and left superior orbit. Zygomatic arches, nasal bone, and mandibles are intact. Temporomandibular joints are congruent with chronic degenerative change. Scattered dental caries. Orbits: Left frontal bone fracture extends through the superior left orbital wall. Both globes are intact. No right orbital fracture. Sinuses: Left frontal bone fracture extends through the left frontal sinus. Small amount of hemorrhage in the left frontal and sphenoid sinuses related to fracture. No additional sinus fracture. Soft tissues: Left supraorbital and frontal scalp hematoma. CT CERVICAL SPINE FINDINGS Alignment: No evidence of traumatic subluxation. Mild retrolisthesis of C4 on C5. Moderate dextroscoliotic curvature. Skull base and vertebrae: Complex C1 fracture involves the right and left anterior and right posterior arch. Minimally displaced fracture through the left C4 transverse process extends through the vertebral foramen. The dens and vertebral body heights are preserved. No occipital condylar fracture. Soft tissues and spinal canal: Prevertebral soft tissue edema adjacent to C1 fracture. No evidence of  canal hematoma. Disc levels: Diffuse disc space narrowing and endplate spurring. Multilevel facet arthropathy. Upper chest: Biapical pleuroparenchymal scarring. Other: None. IMPRESSION: 1. Nondisplaced left frontal bone fracture with small focus of left frontal subarachnoid hemorrhage. 2. Left frontal bone fracture extends through the frontal sinus in the superior orbit. No additional facial bone fracture. 3. Comminuted C1 fracture involving the right and left anterior and right posterior arch. 4. Minimally displaced left C4 transverse process fracture extends through the vertebral foramen. Recommend neck CTA. Critical Value/emergent results were called by telephone at the time of interpretation on 02/02/2018 at 5:06 am to Dr. Ripley Fraise , who verbally acknowledged these results. Electronically Signed   By: Jeb Levering M.D.   On: 02/03/2018 05:07    Review of Systems  Constitutional: Negative.   HENT: Negative.   Eyes: Negative.   Cardiovascular: Negative.   Genitourinary: Negative.   Musculoskeletal: Negative.   Neurological: Negative.   Endo/Heme/Allergies: Negative.   Psychiatric/Behavioral:  Negative.     Blood pressure (!) 155/91, pulse (!) 51, temperature (!) 95.8 F (35.4 C), temperature source Axillary, resp. rate 16, height 6' (1.829 m), weight 72.1 kg (158 lb 15.2 oz), SpO2 100 %. Physical Exam  Constitutional:  Elderly frail-appearing gentleman  Eyes: Conjunctivae are normal. Pupils are equal, round, and reactive to light.  Cardiovascular: Normal rate and regular rhythm.  Respiratory: Effort normal and breath sounds normal.  GI: Soft. Bowel sounds are normal.  Neurological:  Patient is oriented to the hospital but he does not recall when he got here how long he has been here.  He offers no complaints of pain.  His pupils are 3 mm and briskly reactive.  There is a large left periorbital ecchymosis.  There is evidence of subgaleal hematoma on the left side frontally.  His  range of motion in his neck allows him to turn about 30 degrees left and right at which point he does report some discomfort.  His motor function appears intact in the upper and lower extremities.  Ambulation was not tested.  Skin: Skin is warm and dry.     Assessment/Plan Comminuted nondisplaced fracture of C1 and C4.  Minimal subarachnoid hemorrhage basally in the frontal region.  Periorbital ecchymosis.  Plan the patient will have his Eliquis stopped.  He will be discharged back to his nursing care facility tomorrow.  I discussed the situation by phone with the patient's son who is the power of attorney in [facility].  I noted that he does not need to be on any anticoagulant at this time.  His neurologic exam has been stable throughout the day and the patient offers no complaints.  We will arrange transfer back to [facility] at the earliest convenience.  The  Earleen Newport, MD 02/08/2018, 4:16 PM

## 2018-02-01 NOTE — Progress Notes (Signed)
Dr Ellene Route called regarding patient low core temp. Instructed to call hospitalist for follow up.

## 2018-02-01 NOTE — ED Triage Notes (Signed)
Pt brought in by [facility] for c/o unwitnessed fall; staff reported to ems pt has been out of bed all night and pt can become combative at times. Pt is oriented to person only; pt has swelling and bruising to left eye; pt c/o pain to head and neck

## 2018-02-01 NOTE — Progress Notes (Signed)
Patient arrived on unit, reported given by EMS, assessment completed. Vitals put in, MRSA swab completed, CHG bath is completed.  Dr. Ellene Route called/page to alert that patient is on unit.

## 2018-02-01 NOTE — ED Notes (Addendum)
POA - Son Nichole Keltner [phone].  Verbal consent to treat pt at [facility].

## 2018-02-01 NOTE — ED Provider Notes (Signed)
[facility] EMERGENCY DEPARTMENT Provider Note   CSN: [id number] Arrival date & time: 01/29/2018  4403     History   Chief Complaint Chief Complaint  Patient presents with  . Fall   Level 5 caveat due to dementia HPI [name] is a 84 y.o. male.  The history is provided by the EMS personnel and the nursing home. The history is limited by the condition of the patient.  Fall  This is a new problem. Episode onset: just prior to arrival. The problem occurs constantly. The problem has not changed since onset.Associated symptoms include headaches. Nothing aggravates the symptoms. Nothing relieves the symptoms.  Presents from nursing facility after a fall.  Per nursing report, patient had an unwitnessed fall out of bed.  He has swelling and pain to the left side of his head.  Patient is on Eliquis for previous DVT Patient was unable to give any history, he is sitting up in bed holding his head  Past Medical History:  Diagnosis Date  . Arthritis, hip   . Assistance needed for mobility    walks with walker  . DVT (deep venous thrombosis) ([place]) 2016  . Mild dementia   . Never smoked any substance   . Pneumonia 2002, 2010    Patient Active Problem List   Diagnosis Date Noted  . CAP (community acquired pneumonia) 10/09/2017  . Frequent falls 10/09/2017  . Sepsis due to pneumonia ([place]) 10/07/2017  . Abnormal EKG 10/07/2017  . Hip pain 10/07/2017  . Palliative care encounter   . Hyponatremia 03/01/2016  . Left leg weakness 03/01/2016  . Generalized weakness 02/28/2016  . Weakness generalized 02/24/2016  . Lung mass 02/24/2016  . Left leg DVT ([place]) 11/28/2015  . Alzheimer's disease 11/28/2015  . Insomnia 11/28/2015  . Pedal edema 09/20/2014  . Bilateral cellulitis of lower leg 07/28/2014  . CONDUCTION DISORDER OF THE HEART 07/23/2009  . Venous (peripheral) insufficiency 07/23/2009  . ARTHRITIS, HIP 07/15/2009    Past Surgical History:  Procedure Laterality Date  .  APPENDECTOMY     At age 11  . HIP SURGERY     left  . TONSILLECTOMY     As a child       Home Medications    Prior to Admission medications   Medication Sig Start Date End Date Taking? Authorizing Provider  calcium carbonate (TUMS EX) 750 MG chewable tablet Chew 2 tablets by mouth every 2 (two) hours as needed for heartburn.    [provider]  ELIQUIS 2.5 MG TABS tablet TAKE ONE TABLET BY MOUTH TWICE DAILY. 11/27/16   Kathyrn Drown, MD  nitroGLYCERIN (NITROSTAT) 0.4 MG SL tablet Place 1 tablet (0.4 mg total) under the tongue every 5 (five) minutes as needed for chest pain. 06/03/17   Kathyrn Drown, MD  olopatadine (PATANOL) 0.1 % ophthalmic solution Place 1 drop into both eyes 2 (two) times daily. 06/25/17   Kathyrn Drown, MD    Family History History reviewed. No pertinent family history.  Social History Social History   Tobacco Use  . Smoking status: Never Smoker  . Smokeless tobacco: Never Used  Substance Use Topics  . Alcohol use: No  . Drug use: No     Allergies   Ciprofloxacin; Keflex [cephalexin]; and Sulfa antibiotics   Review of Systems Review of Systems  Unable to perform ROS: Dementia  Neurological: Positive for headaches.     Physical Exam Updated Vital Signs BP (!) 159/94  Pulse (!) 52   Resp (!) 21   SpO2 97%   Physical Exam CONSTITUTIONAL: Elderly/frail, no acute distress HEAD: Abrasion and swelling to left forehead EYES: left Periorbital hematoma noted.  No proptosis.  No evidence of globe injury. +EOMI ENMT: Mucous membranes moist, poor dentition, no dental or nasal trauma SPINE/BACK:entire spine nontender, no bruising/crepitance/stepoffs noted to spine Chest-no tenderness or bruising LUNGS: Lungs are clear to auscultation bilaterally, no apparent distress ABDOMEN: soft, nontender and no bruising NEURO: Pt is awake/alert, moves all extremities x4 EXTREMITIES: pulses normal/equal, full ROM, pelvis stable, all other  extremities/joints palpated/ranged and nontender SKIN: warm, color normal, scattered healing abrasions to lower extremities.  No lacerations noted  ED Treatments / Results  Labs (all labs ordered are listed, but only abnormal results are displayed) Labs Reviewed  BASIC METABOLIC PANEL  CBC WITH DIFFERENTIAL/PLATELET  PROTIME-INR    EKG  EKG Interpretation None       Radiology Ct Head Wo Contrast  Result Date: 02/22/2018 CLINICAL DATA:  Post unwitnessed fall with head and neck pain and left periorbital bruising. EXAM: CT HEAD WITHOUT CONTRAST CT MAXILLOFACIAL WITHOUT CONTRAST CT CERVICAL SPINE WITHOUT CONTRAST TECHNIQUE: Multidetector CT imaging of the head, cervical spine, and maxillofacial structures were performed using the standard protocol without intravenous contrast. Multiplanar CT image reconstructions of the cervical spine and maxillofacial structures were also generated. COMPARISON:  Head CT 02/24/2016 FINDINGS: CT HEAD FINDINGS Brain: Small volume subarachnoid hemorrhage in the left frontal lobe subjacent to frontal bone fracture. No pneumocephalus. No surrounding edema or mass effect. No subdural hematoma. Unchanged general atrophy and chronic small vessel ischemia. No midline shift or mass effect. Vascular: No hyperdense vessel or unexpected calcification. Skull: Nondisplaced left frontal bone fracture with large associated scalp hematoma. Other: Large frontal scalp hematoma. CT MAXILLOFACIAL FINDINGS Osseous: Left frontal bone fracture extends through the frontal sinus and left superior orbit. Zygomatic arches, nasal bone, and mandibles are intact. Temporomandibular joints are congruent with chronic degenerative change. Scattered dental caries. Orbits: Left frontal bone fracture extends through the superior left orbital wall. Both globes are intact. No right orbital fracture. Sinuses: Left frontal bone fracture extends through the left frontal sinus. Small amount of hemorrhage in  the left frontal and sphenoid sinuses related to fracture. No additional sinus fracture. Soft tissues: Left supraorbital and frontal scalp hematoma. CT CERVICAL SPINE FINDINGS Alignment: No evidence of traumatic subluxation. Mild retrolisthesis of C4 on C5. Moderate dextroscoliotic curvature. Skull base and vertebrae: Complex C1 fracture involves the right and left anterior and right posterior arch. Minimally displaced fracture through the left C4 transverse process extends through the vertebral foramen. The dens and vertebral body heights are preserved. No occipital condylar fracture. Soft tissues and spinal canal: Prevertebral soft tissue edema adjacent to C1 fracture. No evidence of canal hematoma. Disc levels: Diffuse disc space narrowing and endplate spurring. Multilevel facet arthropathy. Upper chest: Biapical pleuroparenchymal scarring. Other: None. IMPRESSION: 1. Nondisplaced left frontal bone fracture with small focus of left frontal subarachnoid hemorrhage. 2. Left frontal bone fracture extends through the frontal sinus in the superior orbit. No additional facial bone fracture. 3. Comminuted C1 fracture involving the right and left anterior and right posterior arch. 4. Minimally displaced left C4 transverse process fracture extends through the vertebral foramen. Recommend neck CTA. Critical Value/emergent results were called by telephone at the time of interpretation on 02/03/2018 at 5:06 am to Dr. Ripley Fraise , who verbally acknowledged these results. Electronically Signed   By: Fonnie Birkenhead.D.  On: 02/11/2018 05:07   Ct Cervical Spine Wo Contrast  Result Date: 02/19/2018 CLINICAL DATA:  Post unwitnessed fall with head and neck pain and left periorbital bruising. EXAM: CT HEAD WITHOUT CONTRAST CT MAXILLOFACIAL WITHOUT CONTRAST CT CERVICAL SPINE WITHOUT CONTRAST TECHNIQUE: Multidetector CT imaging of the head, cervical spine, and maxillofacial structures were performed using the standard  protocol without intravenous contrast. Multiplanar CT image reconstructions of the cervical spine and maxillofacial structures were also generated. COMPARISON:  Head CT 02/24/2016 FINDINGS: CT HEAD FINDINGS Brain: Small volume subarachnoid hemorrhage in the left frontal lobe subjacent to frontal bone fracture. No pneumocephalus. No surrounding edema or mass effect. No subdural hematoma. Unchanged general atrophy and chronic small vessel ischemia. No midline shift or mass effect. Vascular: No hyperdense vessel or unexpected calcification. Skull: Nondisplaced left frontal bone fracture with large associated scalp hematoma. Other: Large frontal scalp hematoma. CT MAXILLOFACIAL FINDINGS Osseous: Left frontal bone fracture extends through the frontal sinus and left superior orbit. Zygomatic arches, nasal bone, and mandibles are intact. Temporomandibular joints are congruent with chronic degenerative change. Scattered dental caries. Orbits: Left frontal bone fracture extends through the superior left orbital wall. Both globes are intact. No right orbital fracture. Sinuses: Left frontal bone fracture extends through the left frontal sinus. Small amount of hemorrhage in the left frontal and sphenoid sinuses related to fracture. No additional sinus fracture. Soft tissues: Left supraorbital and frontal scalp hematoma. CT CERVICAL SPINE FINDINGS Alignment: No evidence of traumatic subluxation. Mild retrolisthesis of C4 on C5. Moderate dextroscoliotic curvature. Skull base and vertebrae: Complex C1 fracture involves the right and left anterior and right posterior arch. Minimally displaced fracture through the left C4 transverse process extends through the vertebral foramen. The dens and vertebral body heights are preserved. No occipital condylar fracture. Soft tissues and spinal canal: Prevertebral soft tissue edema adjacent to C1 fracture. No evidence of canal hematoma. Disc levels: Diffuse disc space narrowing and endplate  spurring. Multilevel facet arthropathy. Upper chest: Biapical pleuroparenchymal scarring. Other: None. IMPRESSION: 1. Nondisplaced left frontal bone fracture with small focus of left frontal subarachnoid hemorrhage. 2. Left frontal bone fracture extends through the frontal sinus in the superior orbit. No additional facial bone fracture. 3. Comminuted C1 fracture involving the right and left anterior and right posterior arch. 4. Minimally displaced left C4 transverse process fracture extends through the vertebral foramen. Recommend neck CTA. Critical Value/emergent results were called by telephone at the time of interpretation on 02/09/2018 at 5:06 am to Dr. Ripley Fraise , who verbally acknowledged these results. Electronically Signed   By: Jeb Levering M.D.   On: 01/31/2018 05:07   Ct Maxillofacial Wo Contrast  Result Date: 01/28/2018 CLINICAL DATA:  Post unwitnessed fall with head and neck pain and left periorbital bruising. EXAM: CT HEAD WITHOUT CONTRAST CT MAXILLOFACIAL WITHOUT CONTRAST CT CERVICAL SPINE WITHOUT CONTRAST TECHNIQUE: Multidetector CT imaging of the head, cervical spine, and maxillofacial structures were performed using the standard protocol without intravenous contrast. Multiplanar CT image reconstructions of the cervical spine and maxillofacial structures were also generated. COMPARISON:  Head CT 02/24/2016 FINDINGS: CT HEAD FINDINGS Brain: Small volume subarachnoid hemorrhage in the left frontal lobe subjacent to frontal bone fracture. No pneumocephalus. No surrounding edema or mass effect. No subdural hematoma. Unchanged general atrophy and chronic small vessel ischemia. No midline shift or mass effect. Vascular: No hyperdense vessel or unexpected calcification. Skull: Nondisplaced left frontal bone fracture with large associated scalp hematoma. Other: Large frontal scalp hematoma. CT MAXILLOFACIAL FINDINGS Osseous: Left  frontal bone fracture extends through the frontal sinus and left  superior orbit. Zygomatic arches, nasal bone, and mandibles are intact. Temporomandibular joints are congruent with chronic degenerative change. Scattered dental caries. Orbits: Left frontal bone fracture extends through the superior left orbital wall. Both globes are intact. No right orbital fracture. Sinuses: Left frontal bone fracture extends through the left frontal sinus. Small amount of hemorrhage in the left frontal and sphenoid sinuses related to fracture. No additional sinus fracture. Soft tissues: Left supraorbital and frontal scalp hematoma. CT CERVICAL SPINE FINDINGS Alignment: No evidence of traumatic subluxation. Mild retrolisthesis of C4 on C5. Moderate dextroscoliotic curvature. Skull base and vertebrae: Complex C1 fracture involves the right and left anterior and right posterior arch. Minimally displaced fracture through the left C4 transverse process extends through the vertebral foramen. The dens and vertebral body heights are preserved. No occipital condylar fracture. Soft tissues and spinal canal: Prevertebral soft tissue edema adjacent to C1 fracture. No evidence of canal hematoma. Disc levels: Diffuse disc space narrowing and endplate spurring. Multilevel facet arthropathy. Upper chest: Biapical pleuroparenchymal scarring. Other: None. IMPRESSION: 1. Nondisplaced left frontal bone fracture with small focus of left frontal subarachnoid hemorrhage. 2. Left frontal bone fracture extends through the frontal sinus in the superior orbit. No additional facial bone fracture. 3. Comminuted C1 fracture involving the right and left anterior and right posterior arch. 4. Minimally displaced left C4 transverse process fracture extends through the vertebral foramen. Recommend neck CTA. Critical Value/emergent results were called by telephone at the time of interpretation on 02/16/2018 at 5:06 am to Dr. Ripley Fraise , who verbally acknowledged these results. Electronically Signed   By: Jeb Levering M.D.    On: 01/31/2018 05:07    Procedures Procedures  CRITICAL CARE Performed by: Sharyon Cable Total critical care time: 35 minutes Critical care time was exclusive of separately billable procedures and treating other patients. Critical care was necessary to treat or prevent imminent or life-threatening deterioration. Critical care was time spent personally by me on the following activities: development of treatment plan with patient and/or surrogate as well as nursing, discussions with consultants, evaluation of patient's response to treatment, examination of patient, obtaining history from patient or surrogate, ordering and performing treatments and interventions, ordering and review of laboratory studies, ordering and review of radiographic studies, pulse oximetry and re-evaluation of patient's condition. Patient with skull fractures and cervical spine fractures requiring transfer to higher level of care  Medications Ordered in ED Medications  Tdap (BOOSTRIX) injection 0.5 mL (0.5 mLs Intramuscular Given 02/20/2018 0517)     Initial Impression / Assessment and Plan / ED Course  I have reviewed the triage vital signs and the nursing notes.  Pertinent labs & imaging results that were available during my care of the patient were reviewed by me and considered in my medical decision making (see chart for details).     3:57 AM Patient presents after unwitnessed fall from nursing facility.  I spoke to the staff at [facility].  They report the patient fell out of bed, was sent for evaluation due to signs of head trauma.  Will obtain CT imaging of head/face/neck.  I am unable to clinically clear his C-spine. Per nursing home, patient is wheelchair-bound 5:28 AM Call from radiology due to multiple injuries. Patient noted to have frontal bone fracture, cervical spine fractures Aspen collar has been placed. Patient is awake alert, moving all extremities without any difficulty. I have spoken to  the son [name]  at [phone], [phone] I have updated him on the plan 6:05 AM Discussed with Dr. Ellene Route, neurosurgery.  He is reviewed CT imaging of head and C-spine. He reports this is a nonoperative injury He would like to keep patient in cervical collar.  We agreed not to reverse Eliquis as SAH is small. Also, he does not recommend CT angios the neck at this time. He requests that I contact hospitalist for admission at [facility] spoke to his son again, and given an update via phone He does not have a DNR in place, but he does report that his father did not want resuscitative efforts 6:30 AM Discussed again with Dr. Ellene Route, he will admit to stepdown unit at [facility] Final Clinical Impressions(s) / ED Diagnoses   Final diagnoses:  Subarachnoid hemorrhage ([place])  Closed fracture of frontal bone, initial encounter [facility])  Closed sphenoid sinus fracture, initial encounter ([place])  Closed displaced fracture of posterior arch of first cervical vertebra, initial encounter [facility])    ED Discharge Orders    None       Ripley Fraise, MD 02/20/2018 [phone]

## 2018-02-01 NOTE — Consult Note (Signed)
Medical Consultation   [name]  ZHG:[id number]  DOB: Sep 24, 1927  DOA: 02/12/2018  PCP:  [place], Mc[place]ath  Requesting physician: Ellene Route  Reason for consultation: Medical management   History of Present Illness: [name] is an 84 y.o. male with h/o dementia and remote DVT for which he is on Eliquis presenting to [facility] ER after falling out of bed.  He was found to have skull/facial fractures and left frontal subarachnoid hemorrhage and cervical spine fractures and was transferred to [facility] under the care of Dr. Ellene Route.  TRH was consulted to assist with medical management.  The patient is awake and able to answer limited questions but is oriented only to person due to his dementia.  He vaguely reports pain but is unable to specify this further.   Review of Systems:  ROS Unable to perform  PMH, PSH, SH, and FH reviewed in Epic but unable to review with patient due to dementia.  Past Medical History: Past Medical History:  Diagnosis Date  . Arthritis, hip   . Assistance needed for mobility    walks with walker  . DVT (deep venous thrombosis) ([place]) 2016  . Mild dementia   . Never smoked any substance   . Pneumonia 2002, 2010    Past Surgical History: Past Surgical History:  Procedure Laterality Date  . APPENDECTOMY     At age 49  . HIP SURGERY     left  . TONSILLECTOMY     As a child     Allergies:   Allergies  Allergen Reactions  . Ciprofloxacin Other (See Comments)    Reaction:  Neurological changes   . Keflex [Cephalexin] Nausea And Vomiting  . Sulfa Antibiotics Other (See Comments)    Reaction:  Unknown      Social History:  reports that  has never smoked. he has never used smokeless tobacco. He reports that he does not drink alcohol or use drugs.   Family History: History reviewed. No pertinent family history.    Physical Exam: Vitals:   02/06/2018 0830 01/29/2018 0900  02/27/2018 1117 02/18/2018 1229  BP: (!) 148/86 (!) 162/80 (!) 130/95 (!) 158/89  Pulse:   (!) 56 (!) 52  Resp: 11 16 15 13   Temp:   (!) 97.4 F (36.3 C) (!) 97.5 F (36.4 C)  TempSrc:   Axillary Axillary  SpO2:   100% 100%  Weight:   72.1 kg (158 lb 15.2 oz)   Height:   6' (1.829 m)     Constitutional: Alert and awake, oriented x1, not in any acute distress.    Eyes:  EOMI, iris appears normal, anicteric sclera - all on R.  Patient has a large periorbital ecchymosis and further ecchymosis along the left frontal region extending along and up above the hairline  ENMT: external ears and nose appear normal, hard of hearing, Lips appear normal Neck: Aspen collar in place CVS: S1-S2 clear, no murmur rubs or gallops Respiratory:  clear to auscultation bilaterally, no wheezing, rales or rhonchi. Respiratory effort normal. No accessory muscle use.  Abdomen: soft nontender, nondistended, normal bowel sounds, no hepatosplenomegaly, no hernias  Musculoskeletal: : diffuse LE changes c/w venous stasis with dermatitis; there are superficial ulcerations along the right anterior lower leg     Neuro: Cranial nerves II-XII intact, strength, sensation, reflexes Psych: judgement and insight appear normal, stable  mood and affect, mental status Skin: no rashes or lesions or ulcers other than as noted above; he does have extensive tinea pedis primarily on his heels  Data reviewed:  I have personally reviewed following labs and imaging studies Pertinent Labs:   Essentially normal BMP and CBC  Inpatient Medications:   Scheduled Meds: Continuous Infusions: . sodium chloride       Radiological Exams on Admission: Ct Head Wo Contrast  Result Date: 02/03/2018 CLINICAL DATA:  Post unwitnessed fall with head and neck pain and left periorbital bruising. EXAM: CT HEAD WITHOUT CONTRAST CT MAXILLOFACIAL WITHOUT CONTRAST CT CERVICAL SPINE WITHOUT CONTRAST TECHNIQUE: Multidetector CT imaging of the head,  cervical spine, and maxillofacial structures were performed using the standard protocol without intravenous contrast. Multiplanar CT image reconstructions of the cervical spine and maxillofacial structures were also generated. COMPARISON:  Head CT 02/24/2016 FINDINGS: CT HEAD FINDINGS Brain: Small volume subarachnoid hemorrhage in the left frontal lobe subjacent to frontal bone fracture. No pneumocephalus. No surrounding edema or mass effect. No subdural hematoma. Unchanged general atrophy and chronic small vessel ischemia. No midline shift or mass effect. Vascular: No hyperdense vessel or unexpected calcification. Skull: Nondisplaced left frontal bone fracture with large associated scalp hematoma. Other: Large frontal scalp hematoma. CT MAXILLOFACIAL FINDINGS Osseous: Left frontal bone fracture extends through the frontal sinus and left superior orbit. Zygomatic arches, nasal bone, and mandibles are intact. Temporomandibular joints are congruent with chronic degenerative change. Scattered dental caries. Orbits: Left frontal bone fracture extends through the superior left orbital wall. Both globes are intact. No right orbital fracture. Sinuses: Left frontal bone fracture extends through the left frontal sinus. Small amount of hemorrhage in the left frontal and sphenoid sinuses related to fracture. No additional sinus fracture. Soft tissues: Left supraorbital and frontal scalp hematoma. CT CERVICAL SPINE FINDINGS Alignment: No evidence of traumatic subluxation. Mild retrolisthesis of C4 on C5. Moderate dextroscoliotic curvature. Skull base and vertebrae: Complex C1 fracture involves the right and left anterior and right posterior arch. Minimally displaced fracture through the left C4 transverse process extends through the vertebral foramen. The dens and vertebral body heights are preserved. No occipital condylar fracture. Soft tissues and spinal canal: Prevertebral soft tissue edema adjacent to C1 fracture. No  evidence of canal hematoma. Disc levels: Diffuse disc space narrowing and endplate spurring. Multilevel facet arthropathy. Upper chest: Biapical pleuroparenchymal scarring. Other: None. IMPRESSION: 1. Nondisplaced left frontal bone fracture with small focus of left frontal subarachnoid hemorrhage. 2. Left frontal bone fracture extends through the frontal sinus in the superior orbit. No additional facial bone fracture. 3. Comminuted C1 fracture involving the right and left anterior and right posterior arch. 4. Minimally displaced left C4 transverse process fracture extends through the vertebral foramen. Recommend neck CTA. Critical Value/emergent results were called by telephone at the time of interpretation on 02/18/2018 at 5:06 am to Dr. Ripley Fraise , who verbally acknowledged these results. Electronically Signed   By: Jeb Levering M.D.   On: 02/18/2018 05:07   Ct Cervical Spine Wo Contrast  Result Date: 02/15/2018 CLINICAL DATA:  Post unwitnessed fall with head and neck pain and left periorbital bruising. EXAM: CT HEAD WITHOUT CONTRAST CT MAXILLOFACIAL WITHOUT CONTRAST CT CERVICAL SPINE WITHOUT CONTRAST TECHNIQUE: Multidetector CT imaging of the head, cervical spine, and maxillofacial structures were performed using the standard protocol without intravenous contrast. Multiplanar CT image reconstructions of the cervical spine and maxillofacial structures were also generated. COMPARISON:  Head CT 02/24/2016 FINDINGS: CT HEAD FINDINGS Brain: Small  volume subarachnoid hemorrhage in the left frontal lobe subjacent to frontal bone fracture. No pneumocephalus. No surrounding edema or mass effect. No subdural hematoma. Unchanged general atrophy and chronic small vessel ischemia. No midline shift or mass effect. Vascular: No hyperdense vessel or unexpected calcification. Skull: Nondisplaced left frontal bone fracture with large associated scalp hematoma. Other: Large frontal scalp hematoma. CT MAXILLOFACIAL  FINDINGS Osseous: Left frontal bone fracture extends through the frontal sinus and left superior orbit. Zygomatic arches, nasal bone, and mandibles are intact. Temporomandibular joints are congruent with chronic degenerative change. Scattered dental caries. Orbits: Left frontal bone fracture extends through the superior left orbital wall. Both globes are intact. No right orbital fracture. Sinuses: Left frontal bone fracture extends through the left frontal sinus. Small amount of hemorrhage in the left frontal and sphenoid sinuses related to fracture. No additional sinus fracture. Soft tissues: Left supraorbital and frontal scalp hematoma. CT CERVICAL SPINE FINDINGS Alignment: No evidence of traumatic subluxation. Mild retrolisthesis of C4 on C5. Moderate dextroscoliotic curvature. Skull base and vertebrae: Complex C1 fracture involves the right and left anterior and right posterior arch. Minimally displaced fracture through the left C4 transverse process extends through the vertebral foramen. The dens and vertebral body heights are preserved. No occipital condylar fracture. Soft tissues and spinal canal: Prevertebral soft tissue edema adjacent to C1 fracture. No evidence of canal hematoma. Disc levels: Diffuse disc space narrowing and endplate spurring. Multilevel facet arthropathy. Upper chest: Biapical pleuroparenchymal scarring. Other: None. IMPRESSION: 1. Nondisplaced left frontal bone fracture with small focus of left frontal subarachnoid hemorrhage. 2. Left frontal bone fracture extends through the frontal sinus in the superior orbit. No additional facial bone fracture. 3. Comminuted C1 fracture involving the right and left anterior and right posterior arch. 4. Minimally displaced left C4 transverse process fracture extends through the vertebral foramen. Recommend neck CTA. Critical Value/emergent results were called by telephone at the time of interpretation on 02/21/2018 at 5:06 am to Dr. Ripley Fraise , who  verbally acknowledged these results. Electronically Signed   By: Jeb Levering M.D.   On: 02/02/2018 05:07   Dg Chest Port 1 View  Result Date: 01/30/2018 CLINICAL DATA:  Fall. EXAM: PORTABLE CHEST 1 VIEW COMPARISON:  Radiographs 10/07/2017 FINDINGS: Mild vascular congestion. Mild cardiomegaly with normal mediastinal contours. Previous right upper lung consolidation has resolved. No pneumothorax, pleural effusion, or new airspace disease. No acute osseous abnormalities are seen. IMPRESSION: Mild cardiomegaly and vascular congestion. No evidence of traumatic injury. Electronically Signed   By: Jeb Levering M.D.   On: 02/05/2018 06:05   Ct Maxillofacial Wo Contrast  Result Date: 02/23/2018 CLINICAL DATA:  Post unwitnessed fall with head and neck pain and left periorbital bruising. EXAM: CT HEAD WITHOUT CONTRAST CT MAXILLOFACIAL WITHOUT CONTRAST CT CERVICAL SPINE WITHOUT CONTRAST TECHNIQUE: Multidetector CT imaging of the head, cervical spine, and maxillofacial structures were performed using the standard protocol without intravenous contrast. Multiplanar CT image reconstructions of the cervical spine and maxillofacial structures were also generated. COMPARISON:  Head CT 02/24/2016 FINDINGS: CT HEAD FINDINGS Brain: Small volume subarachnoid hemorrhage in the left frontal lobe subjacent to frontal bone fracture. No pneumocephalus. No surrounding edema or mass effect. No subdural hematoma. Unchanged general atrophy and chronic small vessel ischemia. No midline shift or mass effect. Vascular: No hyperdense vessel or unexpected calcification. Skull: Nondisplaced left frontal bone fracture with large associated scalp hematoma. Other: Large frontal scalp hematoma. CT MAXILLOFACIAL FINDINGS Osseous: Left frontal bone fracture extends through the frontal sinus and  left superior orbit. Zygomatic arches, nasal bone, and mandibles are intact. Temporomandibular joints are congruent with chronic degenerative change.  Scattered dental caries. Orbits: Left frontal bone fracture extends through the superior left orbital wall. Both globes are intact. No right orbital fracture. Sinuses: Left frontal bone fracture extends through the left frontal sinus. Small amount of hemorrhage in the left frontal and sphenoid sinuses related to fracture. No additional sinus fracture. Soft tissues: Left supraorbital and frontal scalp hematoma. CT CERVICAL SPINE FINDINGS Alignment: No evidence of traumatic subluxation. Mild retrolisthesis of C4 on C5. Moderate dextroscoliotic curvature. Skull base and vertebrae: Complex C1 fracture involves the right and left anterior and right posterior arch. Minimally displaced fracture through the left C4 transverse process extends through the vertebral foramen. The dens and vertebral body heights are preserved. No occipital condylar fracture. Soft tissues and spinal canal: Prevertebral soft tissue edema adjacent to C1 fracture. No evidence of canal hematoma. Disc levels: Diffuse disc space narrowing and endplate spurring. Multilevel facet arthropathy. Upper chest: Biapical pleuroparenchymal scarring. Other: None. IMPRESSION: 1. Nondisplaced left frontal bone fracture with small focus of left frontal subarachnoid hemorrhage. 2. Left frontal bone fracture extends through the frontal sinus in the superior orbit. No additional facial bone fracture. 3. Comminuted C1 fracture involving the right and left anterior and right posterior arch. 4. Minimally displaced left C4 transverse process fracture extends through the vertebral foramen. Recommend neck CTA. Critical Value/emergent results were called by telephone at the time of interpretation on 02/13/2018 at 5:06 am to Dr. Ripley Fraise , who verbally acknowledged these results. Electronically Signed   By: Jeb Levering M.D.   On: 02/25/2018 05:07    Impression/Recommendations Principal Problem:   Subarachnoid hemorrhage following injury ([place]) Active Problems:    Venous (peripheral) insufficiency   Alzheimer's disease   Skull fracture (HCC)   C1 cervical fracture (HCC)   Tinea pedis   Chronic anticoagulation  Skull fractures, C spine fracture, and subarachnoid hemorrhage related to fall -Patient with an unwitnessed fall out of bed at his SNF -Management as per neurosurgery - he has an Aspen collar in place and we have been advised that ongoing anticoagulation is appropriate given the current situation -He has fentanyl for pain and appears to have adequate pain control at this time. -He was given a tetanus booster  Dementia -Patient appears to have fairly advanced dementia -Since he is already placed, there is no significant benefit from him to take medications for this issue - and he is not -He does not have reported behavioral issues -He should remain on fall precautions  Venous stasis with dermatitis -Will order Eucerin with TAC for B LE  Chronic anticoagulation -Prior h/o DVT and new "chronic partially occlusive DVT in the left femoral and left popliteal veins" in 3/17 -He did not have DVT in 2/18 or PE in 4/18 -Despite the usual recommendation for lifelong AC in the setting of >1 VTE event, in this patient with recurrent falls it may be reasonable to stop his Eliquis now or in the near future; this is an appropriate discussion for his SNF physician if it does not need to be stopped at this time -For now, per notes, Dr. Ellene Route has approved ongoing use of Eliquis  Tinea pedis -Anti-fungal cream ordered   Thank you for this consultation.  The patient appears to be medically stable at this time.  Our [facility] hospitalist team will sign off at this time but will be happy to assist with any further questions.  Time Spent: 8 minutes  Karmen Bongo M.D. [facility] Pager: [phone] 02/06/2018, 12:46 PM

## 2018-02-01 NOTE — Progress Notes (Signed)
Orthopedic Tech Progress Note Patient Details:  [name] Feb 20, 1927 [id number]  Ortho Devices Type of Ortho Device: Soft collar Ortho Device/Splint Location: neck Ortho Device/Splint Interventions: Ordered, Application   Post Interventions Patient Tolerated: Well Instructions Provided: Care of device   Braulio Bosch 02/07/2018, 4:16 PM

## 2018-02-01 NOTE — ED Notes (Addendum)
Pt has swelling noted to the left eye & abrasions to the forehead,  Abrasions cleaned w/ shur clens.

## 2018-02-02 ENCOUNTER — Observation Stay (HOSPITAL_COMMUNITY): Payer: Medicare Other

## 2018-02-02 DIAGNOSIS — S066X0A Traumatic subarachnoid hemorrhage without loss of consciousness, initial encounter: Secondary | ICD-10-CM | POA: Diagnosis not present

## 2018-02-02 DIAGNOSIS — S12301A Unspecified nondisplaced fracture of fourth cervical vertebra, initial encounter for closed fracture: Secondary | ICD-10-CM | POA: Diagnosis not present

## 2018-02-02 DIAGNOSIS — S12001A Unspecified nondisplaced fracture of first cervical vertebra, initial encounter for closed fracture: Secondary | ICD-10-CM | POA: Diagnosis not present

## 2018-02-02 DIAGNOSIS — S0990XA Unspecified injury of head, initial encounter: Secondary | ICD-10-CM | POA: Diagnosis not present

## 2018-02-02 DIAGNOSIS — R4020 Unspecified coma: Secondary | ICD-10-CM | POA: Diagnosis not present

## 2018-02-02 NOTE — Progress Notes (Signed)
Patient ID: [name], male   DOB: 02-20-1927, 84 y.o.   MRN: [id number] CT reviewed .Not much change noted Discussed with son by phone Will write DNR.

## 2018-02-02 NOTE — Progress Notes (Signed)
Dr Hilbert Bible  Returned page regarding patient's low temp. Will continue with bear hugger and check temp q 2hour per Dr Hilbert Bible.

## 2018-02-02 NOTE — Progress Notes (Signed)
Patient's temp is 99.0 rectal, took off bear hugger per Dr Hilbert Bible.

## 2018-02-02 NOTE — Care Management Note (Signed)
Case Management Note  Patient Details  Name: [name] MRN: [id number] Date of Birth: July 25, 1927  Subjective/Objective:   Pt admitted on 02/27/2018 s/p fall with comminuted nondisplaced fracture of C1 and C4, minimal subarachnoid hemorrhage basally in the frontal region, and periorbital ecchymosis.  PTA, pt resided at [facility]).                      Action/Plan: CSW consulted to facilitate return to facility upon medical stability.  Will follow progress.   Expected Discharge Date:                  Expected Discharge Plan:  Assisted Living / Rest Home  In-House Referral:  Clinical Social Work  Discharge planning Services  CM Consult  Post Acute Care Choice:    Choice offered to:     DME Arranged:    DME Agency:     HH Arranged:    HH Agency:     Status of Service:  In process, will continue to follow  If discussed at Long Length of Stay Meetings, dates discussed:    Additional Comments:  Reinaldo Raddle, RN, BSN  Trauma/Neuro ICU Case Manager [phone]

## 2018-02-02 NOTE — Social Work (Signed)
CSW aware that pt is from [facility] CSW has f/u with [facility], will send FL2 and discharge summary for pt when medically appropriate.   CSW continuing to follow.   Alexander Mt, [place] Work [phone]

## 2018-02-02 NOTE — Progress Notes (Signed)
Patient ID: [name], male   DOB: [birth year], 84 y.o.   MRN: [id number] Vital signs are stable this morning Patient however has had progressively worsening confusion combativeness and this morning significant lethargy We will obtain new CT of head I have conversed with the patient's son explaining the change that has occurred since last night Discharge today will be on hold pending CT and clinical reevaluation

## 2018-02-02 NOTE — Progress Notes (Signed)
Update given to patient's caretaker at [facility]).

## 2018-02-02 NOTE — NC FL2 (Signed)
  [place] LEVEL OF CARE SCREENING TOOL     IDENTIFICATION  Patient Name: [name] Birthdate: 26-Apr-1927 Sex: male Admission Date (Current Location): 02/03/2018  [facility] and [place] Number:  Manufacturing engineer and Address:  [facility], [place] [address] [place], [place] [address]      Provider Number: [id number]  Attending Physician Name and Address:  Kristeen Miss, MD  Relative Name and Phone Number:  Matix Henshaw, son, [phone]    Current Level of Care: Hospital Recommended Level of Care: Memory Care Prior Approval Number:    Date Approved/Denied:   PASRR Number:    Discharge Plan: Other (Comment)(Memory Care)    Current Diagnoses: Patient Active Problem List   Diagnosis Date Noted  . Skull fracture ([place]) 02/16/2018  . Subarachnoid hemorrhage following injury ([place]) 02/26/2018  . C1 cervical fracture ([place]) 02/24/2018  . Tinea pedis 02/20/2018  . Chronic anticoagulation 02/11/2018  . CAP (community acquired pneumonia) 10/09/2017  . Frequent falls 10/09/2017  . Sepsis due to pneumonia ([place]) 10/07/2017  . Abnormal EKG 10/07/2017  . Hip pain 10/07/2017  . Palliative care encounter   . Hyponatremia 03/01/2016  . Left leg weakness 03/01/2016  . Generalized weakness 02/28/2016  . Weakness generalized 02/24/2016  . Lung mass 02/24/2016  . Left leg DVT ([place]) 11/28/2015  . Alzheimer's disease 11/28/2015  . Insomnia 11/28/2015  . Pedal edema 09/20/2014  . Bilateral cellulitis of lower leg 07/28/2014  . CONDUCTION DISORDER OF THE HEART 07/23/2009  . Venous (peripheral) insufficiency 07/23/2009  . ARTHRITIS, HIP 07/15/2009    Orientation RESPIRATION BLADDER Height & Weight     Self  Normal Incontinent, External catheter Weight: 158 lb 15.2 oz (72.1 kg) Height:  6' (182.9 cm)  BEHAVIORAL SYMPTOMS/MOOD NEUROLOGICAL BOWEL NUTRITION STATUS      Continent Diet(see discharge summary)  AMBULATORY STATUS COMMUNICATION OF NEEDS  Skin   Extensive Assist(pt uses wheelchair at baseline) Verbally Normal, Skin abrasions                       Personal Care Assistance Level of Assistance  Bathing, Feeding, Dressing Bathing Assistance: Maximum assistance Feeding assistance: Limited assistance Dressing Assistance: Maximum assistance     Functional Limitations Info  Sight, Hearing, Speech Sight Info: Adequate Hearing Info: Adequate Speech Info: Adequate    SPECIAL CARE FACTORS FREQUENCY                       Contractures Contractures Info: Not present    Additional Factors Info  Code Status, Allergies Code Status Info: DNR Allergies Info: CIPROFLOXACIN, KEFLEX CEPHALEXIN, SULFA ANTIBIOTICS            Current Medications (02/02/2018):  This is the current hospital active medication list Current Facility-Administered Medications  Medication Dose Route Frequency Provider Last Rate Last Dose  . miconazole (MICOTIN) 2 % cream   Topical BID Karmen Bongo, MD      . triamcinolone 0.1 % cream : eucerin cream, 1:1   Topical TID Karmen Bongo, MD         Discharge Medications: Please see discharge summary for a list of discharge medications.  Relevant Imaging Results:  Relevant Lab Results:   Additional Information [address] [id number]  [place], [place]

## 2018-02-03 DIAGNOSIS — S12301A Unspecified nondisplaced fracture of fourth cervical vertebra, initial encounter for closed fracture: Secondary | ICD-10-CM | POA: Diagnosis not present

## 2018-02-03 DIAGNOSIS — F0280 Dementia in other diseases classified elsewhere without behavioral disturbance: Secondary | ICD-10-CM | POA: Diagnosis not present

## 2018-02-03 DIAGNOSIS — Z993 Dependence on wheelchair: Secondary | ICD-10-CM | POA: Diagnosis not present

## 2018-02-03 DIAGNOSIS — G301 Alzheimer's disease with late onset: Secondary | ICD-10-CM | POA: Diagnosis not present

## 2018-02-03 DIAGNOSIS — Z66 Do not resuscitate: Secondary | ICD-10-CM | POA: Diagnosis present

## 2018-02-03 DIAGNOSIS — S12390A Other displaced fracture of fourth cervical vertebra, initial encounter for closed fracture: Secondary | ICD-10-CM | POA: Diagnosis present

## 2018-02-03 DIAGNOSIS — S066X0A Traumatic subarachnoid hemorrhage without loss of consciousness, initial encounter: Secondary | ICD-10-CM | POA: Diagnosis not present

## 2018-02-03 DIAGNOSIS — Z79899 Other long term (current) drug therapy: Secondary | ICD-10-CM | POA: Diagnosis not present

## 2018-02-03 DIAGNOSIS — Y92122 Bedroom in nursing home as the place of occurrence of the external cause: Secondary | ICD-10-CM | POA: Diagnosis not present

## 2018-02-03 DIAGNOSIS — S066X9A Traumatic subarachnoid hemorrhage with loss of consciousness of unspecified duration, initial encounter: Secondary | ICD-10-CM | POA: Diagnosis present

## 2018-02-03 DIAGNOSIS — R000 Tachycardia, unspecified: Secondary | ICD-10-CM | POA: Diagnosis present

## 2018-02-03 DIAGNOSIS — R296 Repeated falls: Secondary | ICD-10-CM | POA: Diagnosis present

## 2018-02-03 DIAGNOSIS — Z881 Allergy status to other antibiotic agents status: Secondary | ICD-10-CM | POA: Diagnosis not present

## 2018-02-03 DIAGNOSIS — Z9049 Acquired absence of other specified parts of digestive tract: Secondary | ICD-10-CM | POA: Diagnosis not present

## 2018-02-03 DIAGNOSIS — R51 Headache: Secondary | ICD-10-CM | POA: Diagnosis not present

## 2018-02-03 DIAGNOSIS — Z23 Encounter for immunization: Secondary | ICD-10-CM | POA: Diagnosis not present

## 2018-02-03 DIAGNOSIS — S020XXA Fracture of vault of skull, initial encounter for closed fracture: Secondary | ICD-10-CM | POA: Diagnosis present

## 2018-02-03 DIAGNOSIS — Z8701 Personal history of pneumonia (recurrent): Secondary | ICD-10-CM | POA: Diagnosis not present

## 2018-02-03 DIAGNOSIS — Z7901 Long term (current) use of anticoagulants: Secondary | ICD-10-CM | POA: Diagnosis not present

## 2018-02-03 DIAGNOSIS — F411 Generalized anxiety disorder: Secondary | ICD-10-CM | POA: Diagnosis not present

## 2018-02-03 DIAGNOSIS — S0219XA Other fracture of base of skull, initial encounter for closed fracture: Secondary | ICD-10-CM | POA: Diagnosis present

## 2018-02-03 DIAGNOSIS — Z86718 Personal history of other venous thrombosis and embolism: Secondary | ICD-10-CM | POA: Diagnosis not present

## 2018-02-03 DIAGNOSIS — S12031A Nondisplaced posterior arch fracture of first cervical vertebra, initial encounter for closed fracture: Secondary | ICD-10-CM | POA: Diagnosis present

## 2018-02-03 DIAGNOSIS — Z9181 History of falling: Secondary | ICD-10-CM | POA: Diagnosis not present

## 2018-02-03 DIAGNOSIS — Z515 Encounter for palliative care: Secondary | ICD-10-CM | POA: Diagnosis not present

## 2018-02-03 DIAGNOSIS — S12001A Unspecified nondisplaced fracture of first cervical vertebra, initial encounter for closed fracture: Secondary | ICD-10-CM | POA: Diagnosis not present

## 2018-02-03 DIAGNOSIS — Z9089 Acquired absence of other organs: Secondary | ICD-10-CM | POA: Diagnosis not present

## 2018-02-03 DIAGNOSIS — G309 Alzheimer's disease, unspecified: Secondary | ICD-10-CM | POA: Diagnosis present

## 2018-02-03 DIAGNOSIS — B353 Tinea pedis: Secondary | ICD-10-CM | POA: Diagnosis present

## 2018-02-03 DIAGNOSIS — I872 Venous insufficiency (chronic) (peripheral): Secondary | ICD-10-CM | POA: Diagnosis present

## 2018-02-03 DIAGNOSIS — W06XXXA Fall from bed, initial encounter: Secondary | ICD-10-CM | POA: Diagnosis not present

## 2018-02-03 DIAGNOSIS — K0889 Other specified disorders of teeth and supporting structures: Secondary | ICD-10-CM | POA: Diagnosis present

## 2018-02-03 DIAGNOSIS — S12001B Unspecified nondisplaced fracture of first cervical vertebra, initial encounter for open fracture: Secondary | ICD-10-CM | POA: Diagnosis not present

## 2018-02-03 MED ORDER — CHLORHEXIDINE GLUCONATE 0.12 % MT SOLN
15.0000 mL | Freq: Two times a day (BID) | OROMUCOSAL | Status: DC
  Administered 2018-02-03 – 2018-02-04 (×2): 15 mL via OROMUCOSAL
  Filled 2018-02-03: qty 15

## 2018-02-03 MED ORDER — SODIUM CHLORIDE 0.9 % IV SOLN
INTRAVENOUS | Status: DC
  Administered 2018-02-03: 19:00:00 via INTRAVENOUS

## 2018-02-03 MED ORDER — HYDROMORPHONE HCL 1 MG/ML IJ SOLN
0.5000 mg | INTRAMUSCULAR | Status: DC | PRN
  Administered 2018-02-03: 1 mg via INTRAVENOUS
  Administered 2018-02-03: 0.5 mg via INTRAVENOUS
  Filled 2018-02-03: qty 0.5
  Filled 2018-02-03: qty 1

## 2018-02-03 MED ORDER — ORAL CARE MOUTH RINSE
15.0000 mL | Freq: Two times a day (BID) | OROMUCOSAL | Status: DC
  Administered 2018-02-03 – 2018-02-04 (×2): 15 mL via OROMUCOSAL

## 2018-02-03 NOTE — Progress Notes (Signed)
Orthopedic Tech Progress Note Patient Details:  [name] 1927/03/16 [id number] Replacement collar. Ortho Devices Type of Ortho Device: Soft collar Ortho Device/Splint Location: neck Ortho Device/Splint Interventions: Ordered, Application   Post Interventions Patient Tolerated: Well Instructions Provided: Care of device   Braulio Bosch 02/03/2018, 8:56 PM

## 2018-02-03 NOTE — Progress Notes (Signed)
Caretaker at facility called for an update.

## 2018-02-03 NOTE — Progress Notes (Signed)
Patient more agitated.  Tachypneic at 44 with HR of approx 120.  Decrease in consciousness noted with oxygen sats down to 72 on 3 liters via n/c.  Stridor noted with respirations.  NRB mask placed and patient medicated with Dilaudid for comfort.  Family notified of change in patient's status as requested.  Patient noticeably more comfortable after 20 minutes.  Discussed current status with family members and all are aware of his decline.  One family member to say with him through the night as per his request.  New soft collar applied to stabilize neck as previous collar stained with sanguinous fluid.

## 2018-02-03 NOTE — Progress Notes (Signed)
SLP Cancellation Note  Patient Details Name: [name] MRN: [id number] DOB: 09-Apr-1927   Cancelled treatment:       Reason Eval/Treat Not Completed: Medical issues which prohibited therapy. Per RN, patient with decreased responsiveness, gurgling indicative of poor secretion management, severe pocketing of food. Made NPO at RN discretion. Does not sound appropriate for full evaluation at this time. Will plan to f/u 3/8 in am for potential improvement.   [place], CCC-SLP [phone]    Trixie Maclaren Meryl 02/03/2018, 1:23 PM

## 2018-02-03 NOTE — Progress Notes (Signed)
Pain level according to PAINAD scale = 7.  Pt. Moaning / grimacing and clenching fists.  IV line re-established and on call PA called for orders for prn pain medication.  Will re-assess.

## 2018-02-03 NOTE — Progress Notes (Signed)
Upon assessment patient son, who is also POA, was in the room. Was able to speak to Dr. Ellene Route concerning care for father.

## 2018-02-03 NOTE — Care Management Note (Signed)
Case Management Note  Patient Details  Name: [name] MRN: [id number] Date of Birth: July 08, 1927  Subjective/Objective:   Pt admitted on 02/25/2018 s/p fall with comminuted nondisplaced fracture of C1 and C4, minimal subarachnoid hemorrhage basally in the frontal region, and periorbital ecchymosis.  PTA, pt resided at [facility]).                      Action/Plan: CSW consulted to facilitate return to facility upon medical stability.  Will follow progress.   Expected Discharge Date:                  Expected Discharge Plan:  Assisted Living / Rest Home  In-House Referral:  Clinical Social Work  Discharge planning Services  CM Consult  Post Acute Care Choice:    Choice offered to:     DME Arranged:    DME Agency:     HH Arranged:    HH Agency:     Status of Service:  In process, will continue to follow  If discussed at Long Length of Stay Meetings, dates discussed:    Additional Comments:  02/03/18 J. Carrell Rahmani, RN, BSN Palliative consult pending for goals of care; pt made DNR this morning.    Reinaldo Raddle, RN, BSN  Trauma/Neuro ICU Case Manager [phone]

## 2018-02-03 NOTE — Progress Notes (Signed)
Patient ID: [name], male   DOB: [birth year], 84 y.o.   MRN: [id number] I had a long conversation with [name] son [name] who arrived from [facility] yesterday evening.  I explained the situation of his injuries which include small contusion at the base of the frontal lobe in addition to several fractures that are non-displaced in the cervical spine and are not inherently unstable.  I noted at this point that medically there is no significant care that I can offer that we will alter his neurologic status.  I believe that he is an appropriate patient for palliative care.  We discussed artificial alimentation which I am not in favor of.  For the time being his receiving some IV fluids.  I explained that I would ask for the involvement of the palliative care team and he is accepting of this.  Total time in this discussion was 45 minutes.  On the patient's physical exam this morning he responds with moans to painful stimulation he flinches his shoulder when significant shoulder pain is allowed but he does not open his eyes spontaneously.  His pupils are 2 mm and reactive.

## 2018-02-03 NOTE — Progress Notes (Signed)
Received report from [facility] and will be taking over care for patient at this time.  Daughter and grandson remain at the bedside and no changes from previous assessment. Will continue to monitor.

## 2018-02-03 NOTE — Progress Notes (Signed)
Paged admissions. Hospitalist consult put in 02/02/2018. Nurse has not seen a hospitalist in the last two days of caring for patient and as of now there are no notes pertaining to an assessment/visit. Nurse has questions regarding care of patient.

## 2018-02-04 DIAGNOSIS — S12001B Unspecified nondisplaced fracture of first cervical vertebra, initial encounter for open fracture: Secondary | ICD-10-CM

## 2018-02-04 DIAGNOSIS — G301 Alzheimer's disease with late onset: Secondary | ICD-10-CM

## 2018-02-04 DIAGNOSIS — Z515 Encounter for palliative care: Secondary | ICD-10-CM

## 2018-02-04 DIAGNOSIS — F0280 Dementia in other diseases classified elsewhere without behavioral disturbance: Secondary | ICD-10-CM

## 2018-02-04 DIAGNOSIS — F411 Generalized anxiety disorder: Secondary | ICD-10-CM

## 2018-02-04 DIAGNOSIS — S066X0A Traumatic subarachnoid hemorrhage without loss of consciousness, initial encounter: Secondary | ICD-10-CM

## 2018-02-04 MED ORDER — LORAZEPAM 2 MG/ML IJ SOLN
1.0000 mg | INTRAMUSCULAR | Status: DC | PRN
  Administered 2018-02-04: 1 mg via INTRAVENOUS
  Filled 2018-02-04: qty 1

## 2018-02-04 MED ORDER — MORPHINE SULFATE (PF) 4 MG/ML IV SOLN: 1.0000 mg | INTRAVENOUS | Status: DC | PRN

## 2018-02-04 MED ORDER — HALOPERIDOL LACTATE 5 MG/ML IJ SOLN: 2.0000 mg | Freq: Four times a day (QID) | INTRAMUSCULAR | Status: DC | PRN

## 2018-02-04 MED ORDER — GLYCOPYRROLATE 0.2 MG/ML IJ SOLN: 0.2000 mg | INTRAMUSCULAR | Status: DC | PRN

## 2018-02-28 NOTE — Progress Notes (Signed)
Titrated pt down from a nrb to 10L Hi-Flow and O2 decreased to 88%. Put pt back on nrb.

## 2018-02-28 NOTE — Consult Note (Signed)
Consultation Note Date: 02/13/2018   Patient Name: [name]  DOB: May 30, 1927  MRN: [id number]  Age / Sex: 84 y.o., male  PCP: Kathyrn Drown, MD Referring Physician: Kristeen Miss, MD  Reason for Consultation: Establishing goals of care  HPI/Patient Profile: 84 y.o. male  with past medical history of dementia, pneumonia, DVT on eliquis, and arthritis admitted on 02/10/2018 from memory care after fall. CT scan of brain demonstrated large left frontal hematoma with small amount of subarachnoid blood in basal frontal region. CT cervical spine revealed C1 and C4 fractures that are non-displaced. Patient has showed poor progression through hospitalization with decrease in mental status and combativeness. Repeat CT on 3/6 was unchanged from previous. Baseline dementia. Dr. Ellene Route has spoke with son (POA) and recommending palliative care. Palliative medicine consultation for goals of care/EOL care.   Clinical Assessment and Goals of Care: I have reviewed medical records, discussed with Dr. Ellene Route, and assessed patient at bedside. Upon arrival to room, patient wakes to voice and becomes uncomfortable--pulling at [facility] and cardiac monitor lead. Saintclair Halsted at bedside. Patient nonverbal and not following commands.   Spoke with son, Erion Weightman via telephone, who has already returned to [facility]. Discussed diagnoses, prognosis, GOC, EOL wishes, disposition and options.  Introduced Palliative Medicine as specialized medical care for people living with serious illness. It focuses on providing relief from the symptoms and stress of a serious illness.   We discussed a brief life review of the patient. 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 Two adult children [name] and [name]). Diagnosed with dementia 5+ years ago but son explains that he has been healthy  and on very few medications. He has been living at [facility] care since November. Prior to this, living with daughter and grandson. Prior to hospitalization, patient with frequent falls and mainly wheelchair bound. Fair appetite.   Discussed hospital diagnoses, interventions, and underlying dementia. [name] recalls his conversation with Dr. Ellene Route yesterday including declining functional/cognitive/nutritional status since admission. [name] speaks of Dr. Clarice Pole recommendation for palliative and comfort measures.   I attempted to elicit values and goals of care important to the patient. Advanced directives, concepts specific to code status, artifical feeding and hydration, and rehospitalization were considered and discussed. [name] is documented durable POA. Documentation in epic does not speak of his wishes in regards to medical interventions. [name] clearly tells me his father would "not want to live as a vegetable." [name] is worried about him not receiving nutrition--but also understands after conversation with Dr. Ellene Route that artificial nutrition is not recommended with progressive dementia.   I explained to [name] that his father seemed very uncomfortable when I was at the bedside (pulling at [facility] and cardiac monitor/pulse ox). Educated on my recommendation to transition to comfort focused care in order to provide peace and dignity at EOL. Educated on medications as needed to ensure comfort and relief from suffering. Also educated on comfort feeds if patient  will accept food.   [name] is a very spiritual individual and quotes bible verses during our conversation. He remains hopeful that his father will "come out of this" and have "will to live." [name] also does not want to see his father suffer. After discussion, [name] agrees with transition to comfort focused care. Introduced hospice facility as disposition recommendation. [name] requests more time to consider hospice facility.     Answered questions and concerns. PMT contact information given.   I also spoke with daughter, [name], via telephone and updated her on my conversation with her brother and plan for comfort focused care. Colette was fearful her father would not survive last night. She agrees that he needs to receive medication as needed for comfort and relief from suffering. Educated on comfort measures, medications for symptom management, DNR, and hospice options. Answered questions and concerns. Colette also has PMT contact number.     SUMMARY OF RECOMMENDATIONS    DNR/DNI  Transition to comfort measures only. Patient continues to remove NRB mask. Transitioned to nasal cannula and patient quickly became more comfort. Encouraged RN to treat dyspnea with prn morphine.   Discontinued medications/interventions not aimed at comfort.   Symptom management--see below.   Comfort feeds per patient/family request.   Son and daughter considering hospice options.  Code Status/Advance Care Planning:  DNR  Symptom Management:   Morphine 1-2mg  IV q2h prn pain/dyspnea/air hunger  Ativan 1mg  IV q4h prn anxiety/agitation  Haldol 2mg  IV q6h prn agitation  Robinul 0.2mg  IV q4h prn secretions  Palliative Prophylaxis:   Aspiration, Delirium Protocol, Frequent Pain Assessment, Oral Care and Turn Reposition  Additional Recommendations (Limitations, Scope, Preferences):  Full Comfort Care  Psycho-social/Spiritual:   Desire for further Chaplaincy support: yes  Additional Recommendations: Caregiving  Support/Resources and Education on Hospice  Prognosis:   Hours - Days  Discharge Planning: To Be Determined      Primary Diagnoses: Present on Admission: . Skull fracture ([place]) . Alzheimer's disease . Venous (peripheral) insufficiency . Subarachnoid hemorrhage following injury ([place]) . C1 cervical fracture ([place]) . Tinea pedis   I have reviewed the medical record, interviewed the patient and  family, and examined the patient. The following aspects are pertinent.  Past Medical History:  Diagnosis Date  . Arthritis, hip   . Assistance needed for mobility    walks with walker  . DVT (deep venous thrombosis) ([place]) 2016  . Mild dementia   . Never smoked any substance   . Pneumonia 2002, 2010   Social History   Socioeconomic History  . Marital status: Married    Spouse name: None  . Number of children: None  . Years of education: None  . Highest education level: None  Social Needs  . Financial resource strain: None  . Food insecurity - worry: None  . Food insecurity - inability: None  . Transportation needs - medical: None  . Transportation needs - non-medical: None  Occupational History  . 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 Smoking status: Never Smoker  . Smokeless tobacco: Never Used  Substance and Sexual Activity  . Alcohol use: No  . Drug use: No  . Sexual activity: None  Other Topics Concern  . None  Social History Narrative   No regular exercise   History reviewed. No pertinent family history. Scheduled Meds: . chlorhexidine  15 mL Mouth Rinse BID  . mouth rinse  15 mL Mouth Rinse q12n4p  . miconazole   Topical  BID  . triamcinolone 0.1 % cream : eucerin   Topical TID   Continuous Infusions: . sodium chloride 50 mL/hr at 02/03/18 1853   PRN Meds:.glycopyrrolate, haloperidol lactate, LORazepam, morphine injection Medications Prior to Admission:  Prior to Admission medications   Medication Sig Start Date End Date Taking? Authorizing Provider  calcium carbonate (TUMS EX) 750 MG chewable tablet Chew 2 tablets by mouth every 2 (two) hours as needed for heartburn.   Yes [provider]  ELIQUIS 2.5 MG TABS tablet TAKE ONE TABLET BY MOUTH TWICE DAILY. 11/27/16  Yes Luking, Elayne Snare, MD  nitroGLYCERIN (NITROSTAT) 0.4 MG SL tablet Place 1 tablet (0.4 mg total) under the tongue every 5 (five) minutes as needed for chest  pain. Patient not taking: Reported on 02/17/2018 06/03/17   Kathyrn Drown, MD  olopatadine (PATANOL) 0.1 % ophthalmic solution Place 1 drop into both eyes 2 (two) times daily. Patient not taking: Reported on 02/02/2018 06/25/17   Kathyrn Drown, MD   Allergies  Allergen Reactions  . Ciprofloxacin Other (See Comments)    Reaction:  Neurological changes   . Keflex [Cephalexin] Nausea And Vomiting  . Sulfa Antibiotics Other (See Comments)    Reaction:  Unknown    Review of Systems  Unable to perform ROS: Dementia   Physical Exam  Constitutional: He is uncooperative. He is easily aroused. He appears ill.  Neck:  Aspen collar  Cardiovascular: Tachycardia present.  Pulmonary/Chest: He has decreased breath sounds. He has rhonchi.  Pulling at NRB mask. Transitioned to 3L [place].  Neurological: He is easily aroused. He is disoriented.  Nonverbal, not following commands  Skin: Skin is warm and dry. Ecchymosis noted.  Psychiatric: Cognition and memory are impaired. He is inattentive.  Nursing note and vitals reviewed.  Vital Signs: BP (!) 100/56   Pulse 93   Temp 99.7 F (37.6 C) (Axillary)   Resp (!) 22   Ht 6' (1.829 m)   Wt 72.1 kg (158 lb 15.2 oz)   SpO2 (!) 88%   BMI 21.56 kg/m  Pain Assessment: PAINAD POSS *See Group Information*: 2-Acceptable,Slightly drowsy, easily aroused   SpO2: SpO2: (!) 88 % O2 Device:SpO2: (!) 88 % O2 Flow Rate: .   IO: Intake/output summary:   Intake/Output Summary (Last 24 hours) at 02/13/2018 1223 Last data filed at 02/13/18 1000 Gross per 24 hour  Intake 755.83 ml  Output -  Net 755.83 ml    LBM:   Baseline Weight: Weight: 72.1 kg (158 lb 15.2 oz) Most recent weight: Weight: 72.1 kg (158 lb 15.2 oz)     Palliative Assessment/Data: PPS 10%   Flowsheet Rows     Most Recent Value  Intake Tab  Referral Department  Surgery  Unit at Time of Referral  Intermediate Care Unit  Palliative Care Primary Diagnosis  -- [dementia, fall, left frontal  hematoma]  Palliative Care Type  New Palliative care  Reason for referral  Clarify Goals of Care  Date first seen by Palliative Care  02/13/18  Clinical Assessment  Palliative Performance Scale Score  10%  Psychosocial & Spiritual Assessment  Palliative Care Outcomes  Patient/Family meeting held?  Yes  Who was at the meeting?  spoke with son and daughter via telephone  Palliative Care Outcomes  Clarified goals of care, Provided end of life care assistance, Improved pain interventions, Improved non-pain symptom therapy, Counseled regarding hospice, Provided psychosocial or spiritual support, ACP counseling assistance, Changed to focus on comfort  Time In: 1100 Time Out: 1230 Time Total: 62min Greater than 50%  of this time was spent counseling and coordinating care related to the above assessment and plan.  Signed by:  Ihor Dow, FNP-C Palliative Medicine Team  Phone: [phone] Fax: [phone]   Please contact Palliative Medicine Team phone at [phone] for questions and concerns.  For individual provider: See Shea Evans

## 2018-02-28 NOTE — Discharge Summary (Signed)
Physician Discharge Summary  Patient ID: [name] MRN: [id number] DOB/AGE: Jul 18, 1927 84 y.o.  Admit date: 02/25/2018 Discharge date: March 04, 2018  Admission Diagnoses: Dementia, history of fall, C-spine fracture, closed head injury  Discharge Diagnoses: Dementia, history of fall, C-spine fracture, closed head injury Principal Problem:   Subarachnoid hemorrhage following injury ([place]) Active Problems:   Venous (peripheral) insufficiency   Alzheimer's disease   Skull fracture ([place])   C1 cervical fracture ([place])   Tinea pedis   Chronic anticoagulation   Discharged Condition: Death  Hospital Course: Patient was admitted having sustained a fall in his care facility.  He was brought to the [facility] emergency room where it was noted that he had evidence of a C1 fracture in addition to a closed head injury with a small amount of subarachnoid blood in the basal frontal region on the left.  I was consulted and the patient was noted that he had no one to admit him to the hospital he was transferred to [facility] and I admitted him though he could have been transferred back to his facility.  During the hospitalization the patient initially appeared responsive but confused apparently consistent with his advanced demented state.  While here he became gradually less responsive.  Repeat CT scan showed no changes in his head and after discussions with the family his DO NOT RESUSCITATE order was reinstated here at the hospital.  He was given supportive care and comfort measures and gradually despite receiving supplemental oxygen his respiratory status became more precarious.  He expired at 2:50 PM.  Consults: Palliative care  Significant Diagnostic Studies: None  Treatments: None  Discharge Exam: Blood pressure (!) 100/56, pulse 93, temperature 99.7 F (37.6 C), temperature source Axillary, resp. rate (!) 22, height 6' (1.829 m), weight 72.1 kg (158 lb 15.2 oz), SpO2 (!) 88  %. Expired  Disposition: Expired     Signed: Raymundo Rout J March 04, 2018, 6:08 PM

## 2018-02-28 DEATH — deceased
